# Patient Record
Sex: Female | Born: 1935 | Race: White | Hispanic: No | Smoking: Never smoker
Health system: Southern US, Community
[De-identification: ages and names within clinical notes are randomized; demographics above are authoritative.]

## PROBLEM LIST (undated history)

## (undated) DIAGNOSIS — E039 Hypothyroidism, unspecified: Secondary | ICD-10-CM

## (undated) DIAGNOSIS — J189 Pneumonia, unspecified organism: Secondary | ICD-10-CM

## (undated) DIAGNOSIS — I10 Essential (primary) hypertension: Secondary | ICD-10-CM

## (undated) DIAGNOSIS — J90 Pleural effusion, not elsewhere classified: Secondary | ICD-10-CM

## (undated) DIAGNOSIS — J449 Chronic obstructive pulmonary disease, unspecified: Secondary | ICD-10-CM

## (undated) DIAGNOSIS — F419 Anxiety disorder, unspecified: Secondary | ICD-10-CM

## (undated) DIAGNOSIS — M069 Rheumatoid arthritis, unspecified: Secondary | ICD-10-CM

## (undated) DIAGNOSIS — J9610 Chronic respiratory failure, unspecified whether with hypoxia or hypercapnia: Secondary | ICD-10-CM

## (undated) DIAGNOSIS — I509 Heart failure, unspecified: Secondary | ICD-10-CM

## (undated) HISTORY — DX: Pneumonia, unspecified organism: J18.9

## (undated) HISTORY — PX: HIP SURGERY: SHX245

## (undated) HISTORY — DX: Pleural effusion, not elsewhere classified: J90

## (undated) HISTORY — DX: Chronic respiratory failure, unspecified whether with hypoxia or hypercapnia: J96.10

---

## 2007-04-03 ENCOUNTER — Ambulatory Visit: Payer: Self-pay | Admitting: Pain Medicine

## 2007-04-11 ENCOUNTER — Ambulatory Visit: Payer: Self-pay | Admitting: Pain Medicine

## 2007-04-20 ENCOUNTER — Ambulatory Visit: Payer: Self-pay | Admitting: Pain Medicine

## 2007-05-04 ENCOUNTER — Ambulatory Visit: Payer: Self-pay | Admitting: *Deleted

## 2007-05-09 ENCOUNTER — Ambulatory Visit: Payer: Self-pay | Admitting: Unknown Physician Specialty

## 2007-05-11 ENCOUNTER — Ambulatory Visit: Payer: Self-pay | Admitting: Pain Medicine

## 2007-05-19 ENCOUNTER — Ambulatory Visit: Payer: Self-pay | Admitting: Unknown Physician Specialty

## 2007-05-30 ENCOUNTER — Ambulatory Visit: Payer: Self-pay | Admitting: Pain Medicine

## 2007-06-19 ENCOUNTER — Ambulatory Visit: Payer: Self-pay | Admitting: Physician Assistant

## 2007-07-20 ENCOUNTER — Ambulatory Visit: Payer: Self-pay | Admitting: Pain Medicine

## 2007-07-31 ENCOUNTER — Ambulatory Visit: Payer: Self-pay | Admitting: Pain Medicine

## 2007-09-06 ENCOUNTER — Ambulatory Visit: Payer: Self-pay | Admitting: Pain Medicine

## 2007-09-14 ENCOUNTER — Ambulatory Visit: Payer: Self-pay | Admitting: Pain Medicine

## 2007-09-28 ENCOUNTER — Ambulatory Visit: Payer: Self-pay | Admitting: Physician Assistant

## 2007-11-15 ENCOUNTER — Ambulatory Visit: Payer: Self-pay | Admitting: Physician Assistant

## 2007-12-14 ENCOUNTER — Ambulatory Visit: Payer: Self-pay | Admitting: Physician Assistant

## 2008-01-15 ENCOUNTER — Ambulatory Visit: Payer: Self-pay | Admitting: Physician Assistant

## 2008-02-15 ENCOUNTER — Ambulatory Visit: Payer: Self-pay | Admitting: Physician Assistant

## 2008-03-13 ENCOUNTER — Ambulatory Visit: Payer: Self-pay | Admitting: Family Medicine

## 2008-04-09 ENCOUNTER — Ambulatory Visit: Payer: Self-pay | Admitting: Physician Assistant

## 2008-06-19 ENCOUNTER — Ambulatory Visit: Payer: Self-pay | Admitting: Physician Assistant

## 2008-08-21 ENCOUNTER — Ambulatory Visit: Payer: Self-pay | Admitting: Physician Assistant

## 2008-11-19 ENCOUNTER — Ambulatory Visit: Payer: Self-pay | Admitting: Physician Assistant

## 2009-01-10 ENCOUNTER — Emergency Department: Payer: Self-pay | Admitting: Emergency Medicine

## 2009-02-12 ENCOUNTER — Ambulatory Visit: Payer: Self-pay | Admitting: Physician Assistant

## 2009-03-12 ENCOUNTER — Inpatient Hospital Stay: Payer: Self-pay | Admitting: Internal Medicine

## 2009-03-18 ENCOUNTER — Encounter: Payer: Self-pay | Admitting: Internal Medicine

## 2009-04-02 ENCOUNTER — Ambulatory Visit: Payer: Self-pay | Admitting: Internal Medicine

## 2009-04-05 ENCOUNTER — Encounter: Payer: Self-pay | Admitting: Internal Medicine

## 2009-05-01 ENCOUNTER — Ambulatory Visit: Payer: Self-pay | Admitting: Family Medicine

## 2009-07-14 ENCOUNTER — Ambulatory Visit: Payer: Self-pay | Admitting: Pain Medicine

## 2010-03-09 ENCOUNTER — Inpatient Hospital Stay: Payer: Self-pay | Admitting: Internal Medicine

## 2013-09-29 ENCOUNTER — Inpatient Hospital Stay: Payer: Self-pay | Admitting: Internal Medicine

## 2013-09-29 LAB — COMPREHENSIVE METABOLIC PANEL
ALBUMIN: 2.6 g/dL — AB (ref 3.4–5.0)
ALK PHOS: 391 U/L — AB
Anion Gap: 6 — ABNORMAL LOW (ref 7–16)
BUN: 17 mg/dL (ref 7–18)
Bilirubin,Total: 0.6 mg/dL (ref 0.2–1.0)
CHLORIDE: 103 mmol/L (ref 98–107)
CREATININE: 0.77 mg/dL (ref 0.60–1.30)
Calcium, Total: 9.1 mg/dL (ref 8.5–10.1)
Co2: 27 mmol/L (ref 21–32)
EGFR (African American): >60
EGFR (Non-African Amer.): >60
GLUCOSE: 122 mg/dL — AB (ref 65–99)
Osmolality: 275 (ref 275–301)
POTASSIUM: 4.7 mmol/L (ref 3.5–5.1)
SGOT(AST): 25 U/L (ref 15–37)
SGPT (ALT): 47 U/L (ref 12–78)
SODIUM: 136 mmol/L (ref 136–145)
TOTAL PROTEIN: 7.4 g/dL (ref 6.4–8.2)

## 2013-09-29 LAB — CBC
HCT: 36.4 % (ref 35.0–47.0)
HGB: 12 g/dL (ref 12.0–16.0)
MCH: 31.5 pg (ref 26.0–34.0)
MCHC: 32.9 g/dL (ref 32.0–36.0)
MCV: 96 fL (ref 80–100)
Platelet: 315 10*3/uL (ref 150–440)
RBC: 3.8 10*6/uL (ref 3.80–5.20)
RDW: 14.7 % — ABNORMAL HIGH (ref 11.5–14.5)
WBC: 11.1 10*3/uL — AB (ref 3.6–11.0)

## 2013-09-30 LAB — CBC WITH DIFFERENTIAL/PLATELET
BASOS ABS: 0.1 10*3/uL (ref 0.0–0.1)
Basophil %: 0.6 %
Eosinophil #: 0.1 10*3/uL (ref 0.0–0.7)
Eosinophil %: 1.1 %
HCT: 31.3 % — AB (ref 35.0–47.0)
HGB: 10.2 g/dL — ABNORMAL LOW (ref 12.0–16.0)
Lymphocyte #: 0.4 10*3/uL — ABNORMAL LOW (ref 1.0–3.6)
Lymphocyte %: 4.6 %
MCH: 30.9 pg (ref 26.0–34.0)
MCHC: 32.4 g/dL (ref 32.0–36.0)
MCV: 96 fL (ref 80–100)
MONOS PCT: 12 %
Monocyte #: 1.1 x10 3/mm — ABNORMAL HIGH (ref 0.2–0.9)
NEUTROS ABS: 7.7 10*3/uL — AB (ref 1.4–6.5)
NEUTROS PCT: 81.7 %
Platelet: 312 10*3/uL (ref 150–440)
RBC: 3.28 10*6/uL — ABNORMAL LOW (ref 3.80–5.20)
RDW: 14.3 % (ref 11.5–14.5)
WBC: 9.4 10*3/uL (ref 3.6–11.0)

## 2013-10-04 LAB — CULTURE, BLOOD (SINGLE)

## 2013-10-22 ENCOUNTER — Emergency Department: Payer: Self-pay | Admitting: Emergency Medicine

## 2014-01-28 ENCOUNTER — Inpatient Hospital Stay: Payer: Self-pay | Admitting: Internal Medicine

## 2014-01-28 LAB — CBC
HCT: 37.1 % (ref 35.0–47.0)
HGB: 11.8 g/dL — AB (ref 12.0–16.0)
MCH: 30.5 pg (ref 26.0–34.0)
MCHC: 31.7 g/dL — AB (ref 32.0–36.0)
MCV: 96 fL (ref 80–100)
Platelet: 277 10*3/uL (ref 150–440)
RBC: 3.86 10*6/uL (ref 3.80–5.20)
RDW: 15.4 % — ABNORMAL HIGH (ref 11.5–14.5)
WBC: 9.6 10*3/uL (ref 3.6–11.0)

## 2014-01-28 LAB — BASIC METABOLIC PANEL
ANION GAP: 4 — AB (ref 7–16)
BUN: 18 mg/dL (ref 7–18)
CALCIUM: 8.9 mg/dL (ref 8.5–10.1)
Chloride: 104 mmol/L (ref 98–107)
Co2: 29 mmol/L (ref 21–32)
Creatinine: 0.89 mg/dL (ref 0.60–1.30)
Glucose: 95 mg/dL (ref 65–99)
Potassium: 4.5 mmol/L (ref 3.5–5.1)
SODIUM: 137 mmol/L (ref 136–145)

## 2014-01-28 LAB — TROPONIN I: Troponin-I: <0.02 ng/mL

## 2014-01-29 LAB — CBC WITH DIFFERENTIAL/PLATELET
BASOS PCT: 0.1 %
Basophil #: 0 10*3/uL (ref 0.0–0.1)
Eosinophil #: 0 10*3/uL (ref 0.0–0.7)
Eosinophil %: 0 %
HCT: 33.7 % — AB (ref 35.0–47.0)
HGB: 10.7 g/dL — AB (ref 12.0–16.0)
LYMPHS PCT: 3.7 %
Lymphocyte #: 0.3 10*3/uL — ABNORMAL LOW (ref 1.0–3.6)
MCH: 30.9 pg (ref 26.0–34.0)
MCHC: 31.9 g/dL — ABNORMAL LOW (ref 32.0–36.0)
MCV: 97 fL (ref 80–100)
Monocyte #: 0.2 x10 3/mm (ref 0.2–0.9)
Monocyte %: 2.4 %
Neutrophil #: 6.5 10*3/uL (ref 1.4–6.5)
Neutrophil %: 93.8 %
Platelet: 243 10*3/uL (ref 150–440)
RBC: 3.48 10*6/uL — ABNORMAL LOW (ref 3.80–5.20)
RDW: 15.1 % — ABNORMAL HIGH (ref 11.5–14.5)
WBC: 6.9 10*3/uL (ref 3.6–11.0)

## 2014-01-29 LAB — BASIC METABOLIC PANEL
ANION GAP: 7 (ref 7–16)
BUN: 18 mg/dL (ref 7–18)
CHLORIDE: 105 mmol/L (ref 98–107)
CREATININE: 0.96 mg/dL (ref 0.60–1.30)
Calcium, Total: 8.3 mg/dL — ABNORMAL LOW (ref 8.5–10.1)
Co2: 28 mmol/L (ref 21–32)
EGFR (Non-African Amer.): 58 — ABNORMAL LOW
GLUCOSE: 180 mg/dL — AB (ref 65–99)
Osmolality: 286 (ref 275–301)
Potassium: 4.6 mmol/L (ref 3.5–5.1)
SODIUM: 140 mmol/L (ref 136–145)

## 2014-02-02 LAB — CULTURE, BLOOD (SINGLE)

## 2014-03-05 ENCOUNTER — Ambulatory Visit: Payer: Self-pay | Admitting: Internal Medicine

## 2014-03-10 ENCOUNTER — Inpatient Hospital Stay: Payer: Self-pay | Admitting: Specialist

## 2014-03-10 LAB — CK TOTAL AND CKMB (NOT AT ARMC)
CK, Total: 26 U/L (ref 26–192)
CK-MB: 0.9 ng/mL (ref 0.5–3.6)

## 2014-03-10 LAB — COMPREHENSIVE METABOLIC PANEL
ALK PHOS: 100 U/L
AST: 24 U/L (ref 15–37)
Albumin: 3.1 g/dL — ABNORMAL LOW (ref 3.4–5.0)
Anion Gap: 9 (ref 7–16)
BILIRUBIN TOTAL: 0.4 mg/dL (ref 0.2–1.0)
BUN: 29 mg/dL — AB (ref 7–18)
CALCIUM: 8.5 mg/dL (ref 8.5–10.1)
CO2: 27 mmol/L (ref 21–32)
Chloride: 103 mmol/L (ref 98–107)
Creatinine: 1.14 mg/dL (ref 0.60–1.30)
EGFR (African American): 57 — ABNORMAL LOW
GFR CALC NON AF AMER: 47 — AB
Glucose: 142 mg/dL — ABNORMAL HIGH (ref 65–99)
Osmolality: 286 (ref 275–301)
Potassium: 4.5 mmol/L (ref 3.5–5.1)
SGPT (ALT): 29 U/L
Sodium: 139 mmol/L (ref 136–145)
Total Protein: 7.1 g/dL (ref 6.4–8.2)

## 2014-03-10 LAB — CBC
HCT: 33.1 % — ABNORMAL LOW (ref 35.0–47.0)
HGB: 10.7 g/dL — ABNORMAL LOW (ref 12.0–16.0)
MCH: 31.8 pg (ref 26.0–34.0)
MCHC: 32.3 g/dL (ref 32.0–36.0)
MCV: 99 fL (ref 80–100)
Platelet: 219 10*3/uL (ref 150–440)
RBC: 3.36 10*6/uL — AB (ref 3.80–5.20)
RDW: 16 % — ABNORMAL HIGH (ref 11.5–14.5)
WBC: 7.9 10*3/uL (ref 3.6–11.0)

## 2014-03-10 LAB — PRO B NATRIURETIC PEPTIDE: B-Type Natriuretic Peptide: 2965 pg/mL — ABNORMAL HIGH (ref 0–450)

## 2014-03-10 LAB — TROPONIN I

## 2014-03-11 DIAGNOSIS — I340 Nonrheumatic mitral (valve) insufficiency: Secondary | ICD-10-CM

## 2014-03-11 LAB — CBC WITH DIFFERENTIAL/PLATELET
BASOS ABS: 0 10*3/uL (ref 0.0–0.1)
BASOS PCT: 0.1 %
EOS ABS: 0 10*3/uL (ref 0.0–0.7)
EOS PCT: 0.1 %
HCT: 31 % — ABNORMAL LOW (ref 35.0–47.0)
HGB: 10 g/dL — AB (ref 12.0–16.0)
Lymphocyte #: 0.1 10*3/uL — ABNORMAL LOW (ref 1.0–3.6)
Lymphocyte %: 2 %
MCH: 31.5 pg (ref 26.0–34.0)
MCHC: 32.2 g/dL (ref 32.0–36.0)
MCV: 98 fL (ref 80–100)
MONO ABS: 0.2 x10 3/mm (ref 0.2–0.9)
MONOS PCT: 3.2 %
NEUTROS ABS: 6.8 10*3/uL — AB (ref 1.4–6.5)
Neutrophil %: 94.6 %
Platelet: 213 10*3/uL (ref 150–440)
RBC: 3.16 10*6/uL — AB (ref 3.80–5.20)
RDW: 16 % — ABNORMAL HIGH (ref 11.5–14.5)
WBC: 7.2 10*3/uL (ref 3.6–11.0)

## 2014-03-11 LAB — BASIC METABOLIC PANEL
Anion Gap: 8 (ref 7–16)
BUN: 34 mg/dL — ABNORMAL HIGH (ref 7–18)
CO2: 27 mmol/L (ref 21–32)
Calcium, Total: 8.3 mg/dL — ABNORMAL LOW (ref 8.5–10.1)
Chloride: 104 mmol/L (ref 98–107)
Creatinine: 1.12 mg/dL (ref 0.60–1.30)
EGFR (African American): 59 — ABNORMAL LOW
GFR CALC NON AF AMER: 48 — AB
GLUCOSE: 173 mg/dL — AB (ref 65–99)
Osmolality: 289 (ref 275–301)
Potassium: 4.2 mmol/L (ref 3.5–5.1)
Sodium: 139 mmol/L (ref 136–145)

## 2014-03-11 LAB — MAGNESIUM: MAGNESIUM: 2.4 mg/dL

## 2014-03-12 LAB — BASIC METABOLIC PANEL
Anion Gap: 4 — ABNORMAL LOW (ref 7–16)
BUN: 39 mg/dL — AB (ref 7–18)
CALCIUM: 7.9 mg/dL — AB (ref 8.5–10.1)
CREATININE: 1.27 mg/dL (ref 0.60–1.30)
Chloride: 106 mmol/L (ref 98–107)
Co2: 30 mmol/L (ref 21–32)
EGFR (Non-African Amer.): 42 — ABNORMAL LOW
GFR CALC AF AMER: 51 — AB
Glucose: 111 mg/dL — ABNORMAL HIGH (ref 65–99)
OSMOLALITY: 289 (ref 275–301)
Potassium: 4.2 mmol/L (ref 3.5–5.1)
SODIUM: 140 mmol/L (ref 136–145)

## 2014-03-12 LAB — MAGNESIUM: Magnesium: 2.3 mg/dL

## 2014-03-13 LAB — BASIC METABOLIC PANEL
Anion Gap: 6 — ABNORMAL LOW (ref 7–16)
BUN: 29 mg/dL — AB (ref 7–18)
CALCIUM: 7.7 mg/dL — AB (ref 8.5–10.1)
CO2: 31 mmol/L (ref 21–32)
CREATININE: 1.04 mg/dL (ref 0.60–1.30)
Chloride: 105 mmol/L (ref 98–107)
EGFR (Non-African Amer.): 53 — ABNORMAL LOW
Glucose: 97 mg/dL (ref 65–99)
Osmolality: 289 (ref 275–301)
Potassium: 4.5 mmol/L (ref 3.5–5.1)
SODIUM: 142 mmol/L (ref 136–145)

## 2014-03-20 ENCOUNTER — Inpatient Hospital Stay: Payer: Self-pay | Admitting: Internal Medicine

## 2014-03-20 LAB — COMPREHENSIVE METABOLIC PANEL
ALBUMIN: 3.2 g/dL — AB (ref 3.4–5.0)
ALK PHOS: 77 U/L
Anion Gap: 6 — ABNORMAL LOW (ref 7–16)
BUN: 20 mg/dL — AB (ref 7–18)
Bilirubin,Total: 0.3 mg/dL (ref 0.2–1.0)
CHLORIDE: 101 mmol/L (ref 98–107)
Calcium, Total: 8.9 mg/dL (ref 8.5–10.1)
Co2: 32 mmol/L (ref 21–32)
Creatinine: 1.06 mg/dL (ref 0.60–1.30)
EGFR (Non-African Amer.): 52 — ABNORMAL LOW
Glucose: 132 mg/dL — ABNORMAL HIGH (ref 65–99)
Osmolality: 282 (ref 275–301)
POTASSIUM: 4.2 mmol/L (ref 3.5–5.1)
SGOT(AST): 20 U/L (ref 15–37)
SGPT (ALT): 18 U/L
Sodium: 139 mmol/L (ref 136–145)
Total Protein: 7.3 g/dL (ref 6.4–8.2)

## 2014-03-20 LAB — PRO B NATRIURETIC PEPTIDE: B-Type Natriuretic Peptide: 1808 pg/mL — ABNORMAL HIGH (ref 0–450)

## 2014-03-20 LAB — CBC
HCT: 35.8 % (ref 35.0–47.0)
HGB: 11.3 g/dL — ABNORMAL LOW (ref 12.0–16.0)
MCH: 31.1 pg (ref 26.0–34.0)
MCHC: 31.6 g/dL — ABNORMAL LOW (ref 32.0–36.0)
MCV: 98 fL (ref 80–100)
Platelet: 210 10*3/uL (ref 150–440)
RBC: 3.64 10*6/uL — ABNORMAL LOW (ref 3.80–5.20)
RDW: 15.6 % — ABNORMAL HIGH (ref 11.5–14.5)
WBC: 3.8 10*3/uL (ref 3.6–11.0)

## 2014-03-20 LAB — TROPONIN I: Troponin-I: <0.02 ng/mL

## 2014-03-21 LAB — CBC WITH DIFFERENTIAL/PLATELET
Basophil #: 0 10*3/uL (ref 0.0–0.1)
Basophil %: 0.4 %
Eosinophil #: 0.3 10*3/uL (ref 0.0–0.7)
Eosinophil %: 6.9 %
HCT: 31.2 % — AB (ref 35.0–47.0)
HGB: 9.9 g/dL — AB (ref 12.0–16.0)
LYMPHS ABS: 0.6 10*3/uL — AB (ref 1.0–3.6)
LYMPHS PCT: 15.3 %
MCH: 31.5 pg (ref 26.0–34.0)
MCHC: 31.8 g/dL — AB (ref 32.0–36.0)
MCV: 99 fL (ref 80–100)
Monocyte #: 0.6 x10 3/mm (ref 0.2–0.9)
Monocyte %: 15.3 %
NEUTROS ABS: 2.5 10*3/uL (ref 1.4–6.5)
Neutrophil %: 62.1 %
Platelet: 169 10*3/uL (ref 150–440)
RBC: 3.15 10*6/uL — ABNORMAL LOW (ref 3.80–5.20)
RDW: 15.1 % — ABNORMAL HIGH (ref 11.5–14.5)
WBC: 4 10*3/uL (ref 3.6–11.0)

## 2014-03-21 LAB — BASIC METABOLIC PANEL
Anion Gap: 4 — ABNORMAL LOW (ref 7–16)
BUN: 23 mg/dL — ABNORMAL HIGH (ref 7–18)
CHLORIDE: 102 mmol/L (ref 98–107)
CREATININE: 1.07 mg/dL (ref 0.60–1.30)
Calcium, Total: 8.5 mg/dL (ref 8.5–10.1)
Co2: 32 mmol/L (ref 21–32)
EGFR (African American): >60
EGFR (Non-African Amer.): 51 — ABNORMAL LOW
GLUCOSE: 93 mg/dL (ref 65–99)
Osmolality: 279 (ref 275–301)
Potassium: 4.2 mmol/L (ref 3.5–5.1)
SODIUM: 138 mmol/L (ref 136–145)

## 2014-03-22 ENCOUNTER — Other Ambulatory Visit: Payer: Self-pay | Admitting: Physician Assistant

## 2014-03-22 DIAGNOSIS — I10 Essential (primary) hypertension: Secondary | ICD-10-CM

## 2014-03-22 DIAGNOSIS — I5033 Acute on chronic diastolic (congestive) heart failure: Secondary | ICD-10-CM

## 2014-03-22 LAB — BODY FLUID CELL COUNT WITH DIFFERENTIAL
Basophil: 0 %
Eosinophil: 0 %
Lymphocytes: 57 %
Neutrophils: 4 %
Nucleated Cell Count: 2078 /mm3
OTHER MONONUCLEAR CELLS: 39 %
Other Cells BF: 0 %

## 2014-03-22 LAB — CBC WITH DIFFERENTIAL/PLATELET
Basophil #: 0 10*3/uL (ref 0.0–0.1)
Basophil %: 0.7 %
EOS ABS: 0.3 10*3/uL (ref 0.0–0.7)
Eosinophil %: 7.7 %
HCT: 31.1 % — ABNORMAL LOW (ref 35.0–47.0)
HGB: 9.9 g/dL — AB (ref 12.0–16.0)
Lymphocyte #: 0.5 10*3/uL — ABNORMAL LOW (ref 1.0–3.6)
Lymphocyte %: 15.6 %
MCH: 31.2 pg (ref 26.0–34.0)
MCHC: 31.7 g/dL — ABNORMAL LOW (ref 32.0–36.0)
MCV: 99 fL (ref 80–100)
MONO ABS: 0.5 x10 3/mm (ref 0.2–0.9)
MONOS PCT: 14.7 %
NEUTROS PCT: 61.3 %
Neutrophil #: 2.1 10*3/uL (ref 1.4–6.5)
Platelet: 164 10*3/uL (ref 150–440)
RBC: 3.16 10*6/uL — AB (ref 3.80–5.20)
RDW: 15.1 % — ABNORMAL HIGH (ref 11.5–14.5)
WBC: 3.4 10*3/uL — ABNORMAL LOW (ref 3.6–11.0)

## 2014-03-22 LAB — PROTEIN, BODY FLUID: PROTEIN, BODY FLUID: 2.3 g/dL

## 2014-03-22 LAB — AMYLASE, BODY FLUID: Amylase, Body Fluid: 20 U/L

## 2014-03-22 LAB — ALBUMIN, FLUID (OTHER): BODY FLUID ALBUMIN: 1.3 g/dL

## 2014-03-22 LAB — GLUCOSE, SEROUS FLUID: GLUCOSE, BODY FLUID: 147 mg/dL

## 2014-03-22 LAB — LACTATE DEHYDROGENASE, PLEURAL OR PERITONEAL FLUID: LDH, BODY FLUID: 68 U/L

## 2014-03-24 LAB — BASIC METABOLIC PANEL
Anion Gap: 2 — ABNORMAL LOW (ref 7–16)
BUN: 19 mg/dL — ABNORMAL HIGH (ref 7–18)
CHLORIDE: 101 mmol/L (ref 98–107)
CO2: 37 mmol/L — AB (ref 21–32)
Calcium, Total: 8.1 mg/dL — ABNORMAL LOW (ref 8.5–10.1)
Creatinine: 1 mg/dL (ref 0.60–1.30)
EGFR (African American): >60
GFR CALC NON AF AMER: 55 — AB
GLUCOSE: 96 mg/dL (ref 65–99)
Osmolality: 282 (ref 275–301)
Potassium: 4.3 mmol/L (ref 3.5–5.1)
Sodium: 140 mmol/L (ref 136–145)

## 2014-03-26 ENCOUNTER — Encounter: Payer: Self-pay | Admitting: Internal Medicine

## 2014-03-26 LAB — BODY FLUID CULTURE

## 2014-03-27 LAB — CBC WITH DIFFERENTIAL/PLATELET
BASOS ABS: 0 10*3/uL (ref 0.0–0.1)
Basophil %: 0.4 %
Eosinophil #: 0.3 10*3/uL (ref 0.0–0.7)
Eosinophil %: 6 %
HCT: 31.4 % — AB (ref 35.0–47.0)
HGB: 10.1 g/dL — AB (ref 12.0–16.0)
LYMPHS ABS: 0.6 10*3/uL — AB (ref 1.0–3.6)
LYMPHS PCT: 13 %
MCH: 31.6 pg (ref 26.0–34.0)
MCHC: 32.2 g/dL (ref 32.0–36.0)
MCV: 98 fL (ref 80–100)
MONO ABS: 0.6 x10 3/mm (ref 0.2–0.9)
MONOS PCT: 13 %
NEUTROS ABS: 3 10*3/uL (ref 1.4–6.5)
NEUTROS PCT: 67.6 %
Platelet: 153 10*3/uL (ref 150–440)
RBC: 3.2 10*6/uL — ABNORMAL LOW (ref 3.80–5.20)
RDW: 14.7 % — AB (ref 11.5–14.5)
WBC: 4.5 10*3/uL (ref 3.6–11.0)

## 2014-03-27 LAB — BASIC METABOLIC PANEL
Anion Gap: 1 — ABNORMAL LOW (ref 7–16)
BUN: 20 mg/dL — AB (ref 7–18)
CHLORIDE: 100 mmol/L (ref 98–107)
CO2: 36 mmol/L — AB (ref 21–32)
Calcium, Total: 8.3 mg/dL — ABNORMAL LOW (ref 8.5–10.1)
Creatinine: 1.06 mg/dL (ref 0.60–1.30)
EGFR (African American): >60
GFR CALC NON AF AMER: 52 — AB
GLUCOSE: 92 mg/dL (ref 65–99)
OSMOLALITY: 276 (ref 275–301)
Potassium: 4.1 mmol/L (ref 3.5–5.1)
Sodium: 137 mmol/L (ref 136–145)

## 2014-03-28 LAB — BASIC METABOLIC PANEL
Anion Gap: 4 — ABNORMAL LOW (ref 7–16)
BUN: 22 mg/dL — AB (ref 7–18)
CALCIUM: 8.4 mg/dL — AB (ref 8.5–10.1)
CHLORIDE: 97 mmol/L — AB (ref 98–107)
Co2: 37 mmol/L — ABNORMAL HIGH (ref 21–32)
Creatinine: 1.14 mg/dL (ref 0.60–1.30)
EGFR (African American): 57 — ABNORMAL LOW
EGFR (Non-African Amer.): 47 — ABNORMAL LOW
GLUCOSE: 103 mg/dL — AB (ref 65–99)
Osmolality: 279 (ref 275–301)
POTASSIUM: 3.7 mmol/L (ref 3.5–5.1)
Sodium: 138 mmol/L (ref 136–145)

## 2014-04-05 ENCOUNTER — Encounter: Payer: Self-pay | Admitting: Internal Medicine

## 2014-04-05 ENCOUNTER — Ambulatory Visit: Payer: Self-pay | Admitting: Internal Medicine

## 2014-07-27 NOTE — Discharge Summary (Signed)
Dates of Admission and Diagnosis:  Date of Admission 20-Mar-2014   Date of Discharge 28-Mar-2014   Admitting Diagnosis Pneumonia   Final Diagnosis 1. Bilateral pneumonia 2. Bilateral pleural effusions s/p right thoracentesis 3. Acute on chronic resp failure 4. Acute on chronic diastolic chf    Chief Complaint/History of Present Illness PRIMARY CARE PHYSICIAN:  Dr. Tania Ade.    REFERRING PHYSICIAN:  Gwynneth Albright, MD   CHIEF COMPLAINT:  Shortness of breath for 3 days.   HISTORY OF PRESENT ILLNESS: A 81 year old Caucasian female with a history of hypertension, CHF, rheumatoid arthritis, was sent from home due to shortness of breath for 3 days. The patient is alert, awake, oriented, in no acute distress. The patient was just discharged from our hospital to home about 1 week ago after treatment of acute CHF. The patient was fine until 3 days ago. The patient started to have shortness of breath, cough with clear sputum. The patient feels not good but she denies any fever, even she had some chills. The patient denies any other symptoms. The patient had chest x-ray showed infiltrate. She was treated with Zithromax, Rocephin and admitted for pneumonia.   Allergies:  Levaquin: Rash  Sulfa drugs: Unknown  Pertinent Past History:  Pertinent Past History PAST MEDICAL HISTORY: Acute CHF, hypertension, rheumatoid arthritis, hypothyroidism, anxiety, history of pneumonia.   Hospital Course:  Hospital Course 81 f with chronic obstructive pulmonary disease, ILD, RA here with SOB  * Bilateral Pneumonia - CXR with persistent patchy infiltrate Conitnue abx. d/c Vancomycin. Afebrile. Normal WBC. Bilateral pleural effusions s/p Thoracenetesis on right with  650 ml fluid on 12/18. Transudate. Appreciate pulmonary help,not recommending further thoracentesis.  Cefuroxime at discharge   * Acute on chronic diastolic congestive heart failure - resolved Continue lasix PO as OP  * Hypertension: monitor  on norvasc, lasix, losartan. adjust as need  * Rheumatoid Arthritis - cont. Plaquenil.  * hypothyroidism: continue synthroid  D/C to SNF   Condition on Discharge Fair   Code Status:  Code Status No Code/Do Not Resuscitate   PHYSICAL EXAM ON DISCHARGE:  Physical Exam:  GEN no acute distress, thin   NECK supple  No masses   RESP normal resp effort   CARD regular rate   ABD denies tenderness   EXTR negative edema   PSYCH alert   Additional Comments Decreased hearing   DISCHARGE INSTRUCTIONS HOME MEDS:  Medication Reconciliation: Patient's Home Medications at Discharge:     Medication Instructions  synthroid 50 mcg (0.05 mg) oral tablet  1 tab(s) orally once a day   plaquenil 200 mg oral tablet  1 tab(s) orally once a day   paxil 10 mg oral tablet  1 tab(s) orally once a day   losartan 25 mg oral tablet  1 tab(s) orally once a day   furosemide 20 mg oral tablet  1 tab(s) orally once a day   senna  1 tab(s) orally 2 times a day, As needed, constipation   dextromethorphan-guaifenesin 10 mg-100 mg/5 ml oral liquid  10 milliliter(s) orally every 8 hours, As needed, cough   cefuroxime 500 mg oral tablet  1 tab(s) orally 2 times a day   ensure *  240 milliliter(s) orally    albuterol-ipratropium 2.5 mg-0.5 mg/3 ml inhalation solution  3 milliliter(s) inhaled every 6 hours, As Needed - for Shortness of Breath     Physician's Instructions:  Home Oxygen? Yes   Oxygen delivery at home: 2L  Nasal Cannula   Diet  Low Sodium   Activity Limitations As tolerated   Return to Work Not Applicable   Time frame for Follow Up Appointment 1-2 days  Physician at rehab   Electronic Signatures: Azaria Stegman, Andreas Blower (MD)  (Signed 23-Dec-15 16:02)  Authored: ADMISSION DATE AND DIAGNOSIS, CHIEF COMPLAINT/HPI, Allergies, PERTINENT PAST HISTORY, HOSPITAL COURSE, PHYSICAL EXAM ON DISCHARGE, DISCHARGE INSTRUCTIONS HOME MEDS, PATIENT INSTRUCTIONS   Last Updated: 23-Dec-15 16:02 by  Minette Headland (MD)

## 2014-07-27 NOTE — Discharge Summary (Signed)
PATIENT NAME:  Karen Bryan, WOHLFARTH MR#:  [id number] DATE OF BIRTH:  1919-05-17  DATE OF ADMISSION:  01/28/2014 DATE OF DISCHARGE:  01/30/2014   PRIMARY CARE PHYSICIAN:  [facility].    CONSULTATIONS IN THE HOSPITAL: None.   DISCHARGE DIAGNOSES: 1.  Acute respiratory failure.  2.  Pneumonia.  3.  Interstitial lung disease, which is chronic.  4.  Hypertension.  5.  Rheumatoid arthritis.  6.  Osteoporosis.  7.  Hypothyroidism.  8.  Hard of hearing.  9.  Depression.  10.  Left lung nodule.  DISCHARGE HOME MEDICATION:   1.  Synthroid 50 mcg p.o. daily.  2.  Plaquenil 200 mg p.o. daily.  3.  Paxil 10 mg p.o. daily.  4.  Losartan 50 mg p.o. daily.  5.  Amlodipine 10 mg p.o. daily.  6.  Doxycycline 100 mg p.o. b.i.d. for 7 more days.  7.  Advair 250/50 one puff b.i.d.  8.  Albuterol inhaler q.6 hours p.r.n. for wheezing.  9.  Prednisone taper over 6 days.   Discharge home health physical therapy and nursing home oxygen: None.   DISCHARGE ACTIVITY: As tolerated.   DISCHARGE DIET: Low-sodium diet.    FOLLOWUP INSTRUCTIONS:  PCP follow-up in 1 week.  Lung nodule followup with PCP.   Laboratory data and imaging studies prior to discharge: WBC 6.9, hemoglobin 10.7, hematocrit 33.7, platelet count 243,000.   Sodium 140, potassium 4.6, chloride 105, bicarbonate 28. BUN 18, creatinine 0.96.   Glucose 180. Calcium of 8.3. Blood cultures are negative   Chest x-ray showing chronic interstitial lung disease changes, which are extensive.  New infiltrate in the right lung base and there is left lung 7 mm nodule.  brief hospital course:  Karen Bryan is a 81 year old elderly Caucasian female who lives at home with personal care service coming in and helping her out everyday. Ambulates with a Nicol, at baseline now. Presents to the hospital secondary to difficulty breathing. Chest x-ray shows diffuse interstitial lung disease likely related to her rheumatoid arthritis, but also a possible pneumonia  in the right lung base.   1.  Acute respiratory failure.  As patient was in slight respiratory distress with some tachypnea and also sats were going down to 88% on room air.  The patient has never been on any home oxygen. She was started with oxygen support, IV steroids for wheezing and also antibiotics  Rocephin and azithromycin. Blood cultures are negative. Her oxygen requirements have improved and she is saturating well on room air and is being discharged home. She probably has underlying lung disease and in the future might need home oxygen, but at this point she is doing fine. She has a small left lung nodule noted. Not sure if related to rheumatoid arthritis. Further workup as an outpatient recommended. She is being discharged on doxycycline, prednisone taper and inhalers.  2.  Hypertension.  Home medications were adjusted because her blood pressure was very high in the hospital. She is being discharged on losartan, amlodipine. 3.  Hypothyroidism on Synthroid.  4.  Plaquenil.  She follows with Dr. Lavenia Atlas.    5.  Rheumatoid arthritis.  Continue her Plaquenil and she follows with Dr. Gavin Potters as an outpatient.  6.    Her course has been otherwise uneventful in the hospital.   DISCHARGE CONDITION: Stable.   DISCHARGE DISPOSITION: Home with home health.   Time spent on discharge: 40 minutes.  CODE STATUS: Full code.     ____________________________ Karen Baas,  MD rk:jw D: 01/30/2014 13:11:10 ET T: 01/30/2014 21:21:22 ET JOB#: [id number]  cc: Karen Baas, MD, <Dictator> Karen Baas MD ELECTRONICALLY SIGNED 02/05/2014 10:55

## 2014-07-27 NOTE — Discharge Summary (Signed)
PATIENT NAME:  Karen Bryan, Karen Bryan MR#:  [id number] DATE OF BIRTH:  Nov 09, 1919  PRIMARY CARE PHYSICIAN: Aram Beecham B. Weeks, MD  DISCHARGE DIAGNOSES: 1.  Acute diastolic congestive heart failure.  2.  Dehydration due to Lasix.  3.  Hypertension.  4.  Rheumatoid arthritis.  CONDITION: Stable.   CODE STATUS: Full code.   HOME MEDICATIONS: Please refer to the medication reconciliation list.   DIET: Low-sodium diet.   ACTIVITY: As tolerated.   FOLLOWUP CARE:  PCP within 1-2 weeks. The patient needs home health, needs home oxygen 2 liters by nasal cannula.   REASON FOR ADMISSION: Shortness of breath.   HOSPITAL COURSE: The patient is a 81 year old Caucasian female with a history of hypertension, rheumatoid arthritis, who presented to the ED with shortness of breath and nonproductive cough. For this, chest x-ray showed mild CHF. For detailed history and physical examination, please refer to the admission note dictated by Dr. Cherlynn Kaiser.   LABORATORY DATA: Unremarkable, except BUN 29. Hemoglobin 10.7.   After admission, the patient has been treated with Lasix 20 mg IV b.i.d. with CHF protocol. The patient's symptoms have much improved. She is off oxygen by nasal cannula; however, the patient's O2 saturation decreased to the 80s on exertion without oxygen, so the patient needs home oxygen 2 liters by nasal cannula. For acute CHF, the patient's echocardiograph showed ejection fraction 60%-65%. Since patient's BUN increased to 39, I decreased Lasix to 20 mg daily.  BUN decreased to 29 today.  The patient will keep Lasix 20 mg p.o. daily and watch BMP as outpatient.   Hypertension has been controlled.   The patient has no complaints. She is clinically stable and will be discharged to home with home health today. I discussed patient's discharge plan with the patient, the patient's son, nurse, case Production designer, theatre/television/film.   TIME SPENT: About 41 minutes.    ____________________________ Shaune Pollack,  MD qc:LT D: 03/13/2014 12:59:25 ET T: 03/13/2014 17:18:34 ET JOB#: [id number]  cc: Shaune Pollack, MD, <Dictator> Shaune Pollack MD ELECTRONICALLY SIGNED 03/14/2014 14:43

## 2014-07-27 NOTE — H&P (Signed)
PATIENT NAME:  Karen Bryan, Karen Bryan MR#:  [id number] DATE OF BIRTH:  [birth year]  DATE OF ADMISSION:  09/29/2013  PRIMARY CARE PHYSICIAN: [facility].  PRIMARY HEMATOLOGIST:  Dr. Saverio Danker.  PRIMARY PODIATRY DOCTOR: Dr. Alberteen Spindle.   REFERRING EMERGENCY ROOM PHYSICIAN: Dr. Mindi Junker.   CHIEF COMPLAINT: Fall.  HISTORY OF PRESENT ILLNESS: This is a 81 year old female with history of hypertension, chronic arthritis and lower back pain, who lives at home alone, but has daily 7-8 hours visiting aid and her son and sister-in- law also live nearby taking care of other requirements. At night, she is usually alone at home. For the last 3-4 days, she is having cough which is getting worse, so the aid, who is present in the room told me that she took her to [facility] on Thursday, which is 2 days ago. She was examined by the doctor over there. No x-ray was done, but she was told that there is no pneumonia.  She was taking lisinopril and hydrochlorothiazide for her hypertension for 3-4 years and was taken off from lisinopril and told that that might cause this cough.  She was taken off 2 day ago from that.  Last night, when she tried to get up from the bed and go to the bathroom, she feel down, and so she was brought to the Emergency Room by family today morning. On work-up in the ER, the CAT scans were done to rule out any injuries, which are negative, but it showed that she has pneumonia on her left side of the chest and so she was given at admission to hospitalist team for further management. The patient is hard of hearing and so unable to get much history from the patient, but this information obtained from her sister-in-law and her aid, who is present in her room.  REVIEW OF SYSTEMS:  CONSTITUTIONAL:  Negative for fever, fatigue, weakness, pain or weight loss.  EYES: No blurring, double vision, discharge or redness.  EARS, NOSE, THROAT: No tinnitus, ear pain or hearing loss.  RESPIRATORY: The patient has  cough, but no wheezing or shortness of breath. No sputum production.  CARDIOVASCULAR: No chest pain, orthopnea, edema, arrhythmia or palpitations.  GASTROINTESTINAL: No nausea, vomiting, diarrhea, abdominal pain.  GENITOURINARY: No dysuria, hematuria, or increased frequency.  ENDOCRINE: No heat or cold intolerance. No excessive sweating.  SKIN: No acne, rashes, or lesions.  MUSCULOSKELETAL: No pain or swelling in the joints.  NEUROLOGICAL: No numbness, weakness, tremor or vertigo.  PSYCHIATRIC: No anxiety, insomnia, bipolar disorder.   PAST MEDICAL HISTORY:  1.  Chronic disabling rheumatoid arthritis.  2.  Hypertension.  3.  Osteoarthritis.  4.  Osteoporosis.  5.  Chronic lower back pain, getting epidural injections in pain clinic every 3 months.  6.  Hypothyroidism.  7.  Depression.   SURGICAL HISTORY: 1.  Vertebroplasty.  2.  Left hip surgery.  3.  Cataract excision.   FAMILY HISTORY: Her son has diabetes, but negative for coronary artery disease and hypertension.   SOCIAL HISTORY: Negative tobacco, alcohol, or illicit drug use. She lives at home by herself and walks with a [employer]. Her appetite is good and memory is also preserved, but has some hearing  problem and uses hearing aid. Has aid daily for 7-8 hours and family lives nearby to take care of her.    HOME MEDICATIONS: 1.  Zithromax 500 mg oral once a day.  2.  Synthroid 50 mcg oral once a day.  3.  Paroxetine 10 mg  oral once a day.  4.  Losartan 25 mg once a day.  5.  Keflex 500 mg oral capsule 3 times a day.  6.  Hydroxychloroquine once a day, 200 mg.  7.  Amlodipine 2.5 mg once a day.   PHYSICAL EXAMINATION:  VITAL SIGNS: In ER, temperature 98.4, pulse is 88, respiration is 20, blood pressure 168/60, and pulse oximetry is 85 on room air on presentation, but currently 92 on room air.  GENERAL: The patient is fully alert and oriented to time, place, and person. Has some hearing problem, but cooperative with history  taking and physical examination.  Appears thin, but not in any distress.  HEENT: Head and neck atraumatic. Conjunctiva pink. Oral mucosa moist.  NECK: Supple. No JVD.  RESPIRATORY: Bilateral equal air entry. Crepitation present.  CARDIOVASCULAR: S1, S2 present, regular murmur present. No tenderness on local palpation of chest.  ABDOMEN: Soft, nontender. Bowel sounds present. No organomegaly.  SKIN: No rashes, but there is breakdown of skin, and stitches on left leg, which are stitches that were done by ER today, and dressing present over it.  No edema on the legs. Joints: Chronic deformity because of arthritis. No tenderness.  NEUROLOGICAL: Power 4/5. Generalized weakness. No tremors. Follows commands. Moves all four limbs.  PSYCHIATRIC: Does not appear in any acute psychiatric illness at this time.   IMPORTANT LABORATORY RESULTS: Glucose 122, BUN 17, creatinine 0.77, sodium is 136, potassium 4.7, chloride 103, CO2 is 27. Calcium is 9.1, total protein 7.4, albumin 2.6, bilirubin 0.6, alkaline phosphate 91, SGOT 25, and SGPT 47. WBC 11.1, hemoglobin 12.0, hematocrit 36.4, platelet count is 315,000, and MCV is 96.   CT scan of the head without contrast and cervical spine without contrast are done because of fall, and no acute traumatic injury of cervical spine.  Severe multifocal degenerative disk disease.  Patchy multifocal infiltrate.  Partially visualized lung in right upper lobe and layering left pleural effusion, partially visualized on the upper part of the lung. CT maxillofacial is done, no acute fracture.  CT head did not show any acute intracranial process, global cerebral atrophy with advanced chronic microvascular ischemia, bilateral mastoid effusion.   IMAGING RESULTS: Chest x-ray, PA and lateral, shows new symmetric interstitial and airspace opacity, primarily in left lower lobe suggesting pneumonia versus asymmetric edema, small pleural effusions present.   ASSESSMENT AND PLAN: A  81 year old female with history of hypertension, hypothyroidism, and chronic arthritis, who lives alone, but independent and very active in day-to-day life had cough for 3-4 days and fell down during the night, so she was brought to Emergency Room and found to have pneumonia.  1.  Community-acquired pneumonia.  She started by ER on doxepin and azithromycin.  We will continue the same.  Get sputum culture and continue monitoring.   2.  Fall.  There is no acute fracture, some skin laceration on left leg, for which stitches are done  by ER physician. We will continue wound dressing and monitoring that, and we will get physical therapy evaluation to re-decide the discharge needs   3.  Hypothyroidism. Continue levothyroxine.   4.  Hypertension. Continue home medication losartan and amlodipine.  5.  Chronic arthritis is taking hydroxychloroquine. We will continue the same.   6.  Depression. She is taking paroxetine, will continue the same.   7.  CODE STATUS is DO NOT RESUSCITATE. I confirmed with the patient in presence of her aid and her sister-in-law and her healthcare power of attorney's son  Feliciana Rossetti.  TOTAL TIME SPENT ON THIS ADMISSION: 50 minutes.    ____________________________ Hope Pigeon Elisabeth Pigeon, MD vgv:[id number] D: 09/29/2013 11:22:01 ET T: 09/29/2013 13:38:32 ET JOB#: [id number]  cc: Hope Pigeon. Elisabeth Pigeon, MD, <Dictator> Altamese Dilling MD ELECTRONICALLY SIGNED 10/01/2013 11:07

## 2014-07-27 NOTE — Consult Note (Signed)
General Aspect Primary Cardiologist: New to [facility] _______________  81 year old female with history of chronic diastolic CHF, COPD with chronic respiratory failure on 2L O2 at home via [place], venous insufficiency, recurrent PNA, PUD, RA, HTN, hypothyroid, hearing loss, & osteoporosis who was recently admitted to [facility] 12/6-12/9 for acute on chronic diastolic CHF and underwent successful diuresis presented to [facility] on 12/16 with 3 day history of increased SOB and cough productive of clear sputum. She was found to have a RUL PNA. Cardiology was consulted for further evaluation of her CHF.  _______________  PMH: 1. Chronic diastolic CHF 2. COPD with chronic respiratory failure on 2L O2 at home via [place] 3. Venous insufficiency 4. Recurrent PNA 5. PUD 6. RA 7. HTN 8. Hypothyroidism 9. Hearing loss 10. Osteoporosis ________________   Present Illness 81 year old female with the above complex problem list who was recently admitted as above presented to [facility] with the above complaint.   Patient is with known diastolic CHF. During her last admission from 12/6-12/9 for acute on chronic diastolic CHF she underwent successful diuresis with some dehydration 2/2 Lasix. Echo during that admission showed an EF of 60-65%, mild MR/TR, mildly elevated PASP of 38.6 mm Hg. She denies any prior stress tests or cardiac caths. She has had multiple recent PNAs, mostly occuring in the lower lobes raising question for aspiration. At baseline she sleeps with one pillow, however over the past 3 days she has been sleeping with 2 pillows to aid in her breathing. No early satiety.   She presented to [facility] on 12/16 with a 3 day history of increased SOB and cough that was productive of clear sputum. Afebrile. No chills. She denies any chest pain, palpitations, nausea, vomiting, presyncope, or syncope. Upon her arrival to [facility] she was found to have a right upper lobe PNA and was started on empiric Zithromax and Rocephin. She was seen by  pulmonology who suggested barium swallow 2/2 possible aspiration PNA and to continue incentive spiro. Her ILD was felt to be most likely 2/2 her RA. CT of the chest showed Bilateral moderate pleural effusions. Patchy infiltrative, likely infectious changes throughout both lungs with a more consolidative component in the lower lobes bilaterally. Followup examination following appropriate therapy is recommended. Her diastolic CHF was felt to be well compensated. She continued to be SOB. She is -760 for the admission.   Physical Exam:  GEN no acute distress   HEENT PERRL, HOH   NECK supple  no JVD   RESP diffuse rhonchi   CARD Regular rate and rhythm  Normal, S1, S2  Murmur   Murmur Systolic  2/6   ABD denies tenderness  soft   EXTR negative edema   SKIN normal to palpation   NEURO cranial nerves intact   PSYCH alert   Review of Systems:  General: Fatigue  Weakness   Skin: No Complaints   ENT: No Complaints   Eyes: No Complaints   Neck: No Complaints   Respiratory: Frequent cough  Short of breath  Sputum   Cardiovascular: Dyspnea   Gastrointestinal: No Complaints   Genitourinary: No Complaints   Vascular: No Complaints   Musculoskeletal: No Complaints   Neurologic: No Complaints   Hematologic: No Complaints   Endocrine: No Complaints   Psychiatric: No Complaints   Review of Systems: All other systems were reviewed and found to be negative   Medications/Allergies Reviewed Medications/Allergies reviewed   Family & Social History:  Family and Social History:  Family History Coronary Artery Disease   Social History negative tobacco, negative ETOH, negative Illicit drugs   Place of Living Home     Hypothyroidism:    htn:    Rheumatoid Arthritis: 1950   Cataract Extraction: Bilateral, 2004   Hip Surgery - Left: Replacement, 1992  Home Medications: Medication Instructions Status  furosemide 20 mg oral tablet 1 tab(s) orally once a day Active   Paxil 10 mg oral tablet 1 tab(s) orally once a day Active  Plaquenil 200 mg oral tablet 1 tab(s) orally once a day Active  Synthroid 50 mcg (0.05 mg) oral tablet 1 tab(s) orally once a day Active  losartan 25 mg oral tablet 1 tab(s) orally once a day Active   Lab Results:  Routine Chem:  17-Dec-15 05:22   Glucose, Serum 93  BUN  23  Creatinine (comp) 1.07  Sodium, Serum 138  Potassium, Serum 4.2  Chloride, Serum 102  CO2, Serum 32  Calcium (Total), Serum 8.5  Anion Gap  4  Osmolality (calc) 279  eGFR (African American) >60  eGFR (Non-African American)  51 (eGFR values <52m/min/1.73 m2 may be an indication of chronic kidney disease (CKD). Calculated eGFR, using the MRDR Study equation, is useful in  patients with stable renal function. The eGFR calculation will not be reliable in acutely ill patients when serum creatinine is changing rapidly. It is not useful in patients on dialysis. The eGFR calculation may not be applicable to patients at the low and high extremes of body sizes, pregnant women, and vegetarians.)  Cardiac:  16-Dec-15 16:10   Troponin I < 0.02 (0.00-0.05 0.05 ng/mL or less: NEGATIVE  Repeat testing in 3-6 hrs  if clinically indicated. >0.05 ng/mL: POTENTIAL  MYOCARDIAL INJURY. Repeat  testing in 3-6 hrs if  clinically indicated. NOTE: An increase or decrease  of 30% or more on serial  testing suggests a  clinically important change)  Routine Hem:  17-Dec-15 05:22   WBC (CBC) 4.0  RBC (CBC)  3.15  Hemoglobin (CBC)  9.9  Hematocrit (CBC)  31.2  Platelet Count (CBC) 169  MCV 99  MCH 31.5  MCHC  31.8  RDW  15.1  Neutrophil % 62.1  Lymphocyte % 15.3  Monocyte % 15.3  Eosinophil % 6.9  Basophil % 0.4  Neutrophil # 2.5  Lymphocyte #  0.6  Monocyte # 0.6  Eosinophil # 0.3  Basophil # 0.0 (Result(s) reported on 21 Mar 2014 at 06:18AM.)   EKG:  EKG Interp. by me   Interpretation NSR, 67 bpm, no st/t changes   Radiology Results: XRay:     16-Dec-15 16:52, Chest PA and Lateral  Chest PA and Lateral   REASON FOR EXAM:    Shortness of Breath  COMMENTS:   May transport without cardiac monitor    PROCEDURE: DXR - DXR CHEST PA (OR AP) AND LATERAL  - Mar 20 2014  4:52PM     CLINICAL DATA:  Cough for 1 week shortness of breath, chest  congestion    EXAM:  CHEST  2 VIEW    COMPARISON:  03/10/2014    FINDINGS:  Cardiomediastinal silhouette is stable. Again noted bilateral  interstitial prominence probable chronic without definite  superimposed pulmonary edema. Small left pleural effusion with left  basilar atelectasis or infiltrate. There is patchy  infiltrate/pneumonitis in right upper lobe. Follow-up to resolution  is recommended. Osteopenia and degenerative changes thoracic spine.  Prior vertebroplasty lumbar spine.     IMPRESSION:  Probable chronic interstitial  prominence without convincing  pulmonary edema. Patchy infiltrate/ pneumonitis in right upper lobe.  Small left pleural effusion with left basilar atelectasis or  infiltrate.      Electronically Signed    By: Lahoma Crocker M.D.    On: 03/20/2014 17:04         Verified By: Ephraim Hamburger, M.D.,  CT:    17-Dec-15 16:33, CT Chest Without Contrast  CT Chest Without Contrast   REASON FOR EXAM:    sob  COMMENTS:       PROCEDURE: CT  - CT CHEST WITHOUT CONTRAST  - Mar 21 2014  4:33PM     CLINICAL DATA:  Shortness of breath with exertion    EXAM:  CT CHEST WITHOUT CONTRAST    TECHNIQUE:  Multidetector CT imaging of the chest was performed following the  standard protocol without IV contrast..    COMPARISON:  03/20/2014  FINDINGS:  The lungs are well aerated bilaterally. Patchy infiltrates are noted  throughout both lobes with more consolidative appearance in the  lower lobes bilaterally. Bilateral moderate pleural effusions are.  No focal parenchymal nodules are seen. Diffuse bronchial  calcifications are seen. Aortic calcifications are noted  without  aneurysmal dilatation. No significant hilar or mediastinal  adenopathy is seen. Heavy coronary calcifications are noted.  Visualized upper abdomen is within normal limits. Multilevel  degenerative changes are noted in the thoracic spine. Changes of  prior vertebral augmentation are noted in the lower thoracic spine.     IMPRESSION:  Bilateral moderate pleural effusions.  Patchy infiltrative, likely infectious changes throughout both lungs  with a more consolidative component in the lower lobes bilaterally.  Followup examination following appropriate therapy is recommended.      Electronically Signed    By: Inez Catalina M.D.    On: 03/21/2014 17:02         Verified By: Everlene Farrier, M.D.,    Levaquin: Rash  Sulfa drugs: Unknown  Vital Signs/Nurse's Notes: **Vital Signs.:   18-Dec-15 05:23  Vital Signs Type Routine  Temperature Temperature (F) 98.1  Celsius 36.7  Pulse Pulse 60  Respirations Respirations 20  Systolic BP Systolic BP 013  Diastolic BP (mmHg) Diastolic BP (mmHg) 78  Mean BP 102  Pulse Ox % Pulse Ox % 96  Pulse Ox Activity Level  At rest  Oxygen Delivery 2L    Impression 81 year old female with history of chronic diastolic CHF, COPD with chronic respiratory failure on 2L O2 at home via [place], venous insufficiency, recurrent PNA, PUD, RA, HTN, hypothyroid, hearing loss, & osteoporosis who was recently admitted to [facility] 12/6-12/9 for acute on chronic diastolic CHF and underwent successful diuresis presented to [facility] on 12/16 with 3 day history of increased SOB and cough productive of clear sputum. She was found to have a RUL PNA. Cardiology was consulted for further evaluation of her diastolic CHF.   1. Acute on chronic diastolic CHF: -Recent echo during prior admission (03/11/2014) with EF 60-65%, mild MR/TR, mildly elevated PASP (38.6 mmHg) -Continue with optimal medical therapy -Increase Lasix to 40 mg daily given CT findings of moderate bilateral  pleural effusions - monitor SCr (currently 1.07) -Continue losartan 25 mg daily -Not on b-blocker at this time given chronic respiratory failure -IM considering possible thoracentesis (therapeutic)  2. ILD/PNA: -On ABX -Per IM  3. HTN: -Lasix increased as above -Continue losartan  4. Hypothyroidism  5. RA   Electronic Signatures for Addendum Section:  Kathlyn Sacramento (MD) (Signed  Addendum 18-Dec-15 19:06)  The patient was seen and examined. AGree with the above. She presented with dyspnea with B pleural effusions. REcent echo showed normal EF with diastolic dysfunction and mild pulmonary hypertension.  AGree with gentle diuresis.   Electronic Signatures: Kathlyn Sacramento (MD)  (Signed 18-Dec-15 19:06)  Co-Signer: General Aspect/Present Illness, History and Physical Exam, Review of System, Family & Social History, Past Medical History, Home Medications, Labs, EKG , Radiology, Allergies, Vital Signs/Nurse's Notes, Impression/Plan Daphnee Preiss M (PA-C)  (Signed 18-Dec-15 12:28)  Authored: General Aspect/Present Illness, History and Physical Exam, Review of System, Family & Social History, Past Medical History, Home Medications, Labs, EKG , Radiology, Allergies, Vital Signs/Nurse's Notes, Impression/Plan   Last Updated: 18-Dec-15 19:06 by Kathlyn Sacramento (MD)

## 2014-07-27 NOTE — Discharge Summary (Signed)
PATIENT NAME:  Karen Bryan, Karen Bryan MR#:  [id number] DATE OF BIRTH:  1920-01-27  DATE OF ADMISSION:  09/29/2013 DATE OF DISCHARGE:  10/02/2013  PRIMARY CARE PHYSICIAN: [facility], Dr. Drema Halon.  FINAL DIAGNOSES:  1. Pneumonia.  2. Fall with laceration of the leg and bruising of the left face.  3. Hypertension.  4. Hypothyroidism.   MEDICATIONS ON DISCHARGE: Include Paxil 10 mg daily, hydroxychloroquine 200 mg daily, losartan 25 mg daily, Synthroid 50 mcg daily, amlodipine 2.5 mg daily, Zithromax 250 mg 1 tablet daily for 3 days, cefuroxime 500 mg 1 tablet every 12 hours for 7 more days.   Home health, physical therapy and nurse.  Stitches need to be kept clean and dry. Keep the bandage in place unless becomes loose or soiled. Need to remove sutures in 1 week with your medical doctor.   DIET: Low-sodium diet, regular consistency.   ACTIVITY: As tolerated.   FOLLOWUP: 1-2 weeks with Dr. Cleaster Corin.    HOSPITAL COURSE: The patient admitted 09/29/2013, discharged 10/02/2013. Came in with a fall, was found to have pneumonia, was started on Rocephin and Zithromax. For her fall, she did have a laceration which required sutures in the Emergency Room.  LABORATORY AND RADIOLOGICAL DATA: EKG showed a normal sinus rhythm, nonspecific ST-T wave changes. CT scan of the maxillofacial area showed no acute intracranial process, global cerebral atrophy, chronic advanced microvascular ischemic disease, bilateral mastoid effusions. Large left infraorbital contusion, globes intact. No retro-orbital hematoma or other pathology. No orbital floor fracture.   CT scan of the cervical spine showed no acute traumatic injury, multilevel degenerative disk disease throughout the cervical spine. Patchy multifocal infiltrates, partially visualized upper lobe suspicious for pneumonia. Chest x-ray showed interstitial and airspace opacities in the left lower lobe suggestive of pneumonia.   White blood cell  count 11.1, H and H 12.0 and 36.4, platelet count of 315,000, glucose 122, BUN 17, creatinine 0.77, sodium 136, potassium 4.7, chloride 103, CO2 of 27, calcium 9.1, alkaline phosphatase 391, ALT 47, AST 25. Blood cultures negative. White count upon discharge 9.4. Patient afebrile on discharge 98.3, temperature.  HOSPITAL COURSE PER PROBLEM LIST:  1. For the patient's pneumonia with leukocytosis, this was treated with Rocephin and Zithromax during the hospital course, switched over to Ceftin and Zithromax orally secondary to IV infiltration. The patient's vital signs were stable on 10/01/2013. Family was out of town though, unsafe to discharge on the 29th secondary to nobody else at home, and the patient living alone. Family did set up aides to stay with them until they come back into town. The patient was discharged home with home health on 10/02/2013.  2. Fall with laceration of the leg and bruising of the left face. Will need sutures removed next week.  3. Hypertension. Blood pressure borderline on amlodipine and losartan. Continue current medications.  4. Hypothyroidism. On Synthroid. Continue levothyroxine.   TIME SPENT ON DISCHARGE: 35 minutes.    ____________________________ Herschell Dimes. Renae Gloss, MD rjw:lt D: 10/02/2013 14:37:00 ET T: 10/02/2013 23:44:30 ET JOB#: [id number]  cc: Herschell Dimes. Renae Gloss, MD, <Dictator> [facility] Jeanella Anton MD ELECTRONICALLY SIGNED 10/04/2013 16:20

## 2014-07-27 NOTE — H&P (Signed)
PATIENT NAME:  Karen Bryan, CONSTABLE MR#:  [id number] DATE OF BIRTH:  22-Oct-1919  PRIMARY CARE PHYSICIAN: [facility].   CHIEF COMPLAINT: Shortness of breath.   HISTORY OF PRESENT ILLNESS: This is a 81 year old female who presents to the hospital complaining of shortness of breath that began last night into this morning. The patient was recently admitted to the hospital about a month ago at [facility] for pneumonia, now comes in complaining of shortness of breath again that began acutely this morning. The patient admits to a cough, but it is nonproductive. She denies any paroxysmal nocturnal dyspnea, no orthopnea, any lower extremity edema or any weight gain. She denies any fevers, chills, chest pain, nausea, vomiting, or any other associated symptoms. The patient presented to the hospital and the patient's chest x-ray finding was just of mild CHF. Hospitalist services were contacted for further treatment and evaluation.   REVIEW OF SYSTEMS:  CONSTITUTIONAL: No documented fever. No weight gain, no weight loss.  EYES: No blurry or double vision.  EARS, NOSE, THROAT: No tinnitus. No postnasal drip. No redness of the oropharynx.  RESPIRATORY: Positive cough. Positive wheeze. Positive shortness of breath.  CARDIOVASCULAR: No chest pain. No orthopnea, no palpitations, no syncope.  GASTROINTESTINAL: No nausea, no vomiting. No diarrhea. No abdominal pain. No melena, no hematochezia.  GENITOURINARY: No dysuria, no hematuria. ENDOCRINE:   No polyuria or nocturia. No heat or cold intolerance.  HEMATOLOGIC: No anemia, no bruising, no bleeding.  INTEGUMENTARY: No rashes, no lesions.  MUSCULOSKELETAL: No arthritis, no swelling, no gout.  NEUROLOGIC: No numbness, no tingling, no ataxia. No seizure activity.  PSYCHIATRIC: Positive anxiety, no insomnia. No ADD.   PAST MEDICAL HISTORY: Consistent with rheumatoid arthritis, hypothyroidism, hypertension, anxiety, history of recent pneumonia.   ALLERGIES: LEVAQUIN AND SULFA  DRUGS.   SOCIAL HISTORY: No smoking. No alcohol abuse. No illicit drug abuse. Lives by herself.   FAMILY HISTORY: The patient's mother died from complications of CHF. Father died of unknown causes.   CURRENT MEDICATIONS: As follows: Advair 250/50 one puff b.i.d., albuterol inhaler 2 puffs q. 6 hours as needed, Norvasc 10 mg daily, losartan 25 mg daily, Paxil 10 mg daily, Plaquenil 200 mg daily, Synthroid 50 mcg daily.   PHYSICAL EXAMINATION: Presently is as follows:  VITAL SIGNS: Noted to be temperature of 98.2, pulse 99, respirations 22, blood pressure 146/58, saturation is 88% on room air.  GENERAL: She is a pleasant-appearing female in no apparent distress.  HEAD, EYES, EARS, NOSE AND THROAT: Atraumatic, normocephalic. Extraocular muscles are intact. Pupils equal and reactive to light. Sclerae anicteric. No conjunctival injection. No pharyngeal erythema.  NECK: Supple. There is no jugular venous distention. No bruits, no lymphadenopathy, no thyromegaly.  HEART: Regular rate and rhythm. She does have a 2/6 systolic ejection murmur at the left sternal border. No rubs, no clicks.  LUNGS: She has some dried coarse crackles diffusely. Negative use of accessory muscles. No dullness to percussion.  ABDOMEN: Soft, flat, nontender, nondistended. Has good bowel sounds. No hepatosplenomegaly appreciated.  EXTREMITIES: No evidence of cyanosis or clubbing. Does have +1-2 pitting edema from knees to the ankles bilaterally, +2 pedal and radial pulses bilaterally.  NEUROLOGICAL: The patient is alert, awake, and oriented x 3 with no focal motor or sensory deficits appreciated bilaterally.  SKIN: Moist and warm with no rashes appreciated.  LYMPHATIC: There is no cervical or axillary lymphadenopathy.   LABORATORY DATA: Showed a serum glucose of 142, BUN 29, creatinine 1.14, sodium 139, potassium 4.5, chloride 103,  bicarbonate 27. The patient's LFTs are within normal limits. White cell count 7.9, hemoglobin  10.7, hematocrit 33.1, platelet count 219,000.   IMAGING:  The patient did have a chest x-ray done which showed interstitial pulmonary edema with more focal right upper lobe airspace opacity which could represent asymmetric alveolar edema, although alveolar filling process could appear similar.  ASSESSMENT AND PLAN: This is a 81 year old female with history of rheumatoid arthritis, hypothyroidism, hypertension, anxiety, history of recent pneumonia, who presents to the hospital due to shortness of breath.   Problem #1.  Shortness of breath. This is likely multifactorial in nature. The patient likely has some interstitial lung disease related to her rheumatoid arthritis complicated with some mild congestive heart failure. I will diurese the patient with intravenous Lasix, follow ins and outs and daily weights, check a 2-dimensional echocardiogram. The patient likely needs to be on home oxygen prior to discharge, which needs to be assessed.  Problem #2.  Congestive heart failure. This is likely acute decompensated congestive heart failure. I presume it is probably diastolic, but we will check a 2-dimensional echocardiogram, diurese with intravenous Lasix and follow ins and outs.  Problem #3.  Hypothyroidism. Continue Synthroid.  Problem #4.  Hypertension, hemodynamically stable. Continue Norvasc and continue losartan.  Problem #5.  History of rheumatoid arthritis. Continue Plaquenil.  The patient follows with Dr. Gavin Potters.  Problem #6.  Anxiety. Continue with Paxil.   CODE STATUS: The patient is a full code.   TIME SPENT: 50 minutes.    ____________________________ Rolly Pancake. Cherlynn Kaiser, MD vjs:LT D: 03/10/2014 15:17:09 ET T: 03/10/2014 16:14:57 ET JOB#: [id number]  cc: Rolly Pancake. Cherlynn Kaiser, MD, <Dictator> Houston Siren MD ELECTRONICALLY SIGNED 03/22/2014 10:43

## 2014-07-27 NOTE — H&P (Signed)
PATIENT NAME:  Karen Bryan, Karen Bryan MR#:  [id number] DATE OF BIRTH:  [birth year]  ADMITTING PHYSICIAN: Enid Baas, MD  PRIMARY CARE PHYSICIAN: At [facility].   CHIEF COMPLAINT: Difficulty breathing, and coughing.   HISTORY OF PRESENT ILLNESS: Karen Bryan is a very pleasant, 81 year old, elderly Caucasian female with past medical history significant for hypertension, rheumatoid arthritis, osteoarthritis, osteoporosis, and hypothyroidism.  Presents to the hospital from home secondary to worsening breathing, cough, and congestion for 4 days now. The patient is very hard of hearing and her home care nurse, who was assisting her in the ER, has just left.  According to the patient, she started feeling bad about 4-5 days ago, started with more like congestion, upper respiratory tract infection symptoms and the cough started to get worse, so that she has been having trouble breathing and she was noted to have audible wheezing today and was brought to the ER. Her saturations were 95% on room air at rest; however, as soon as she started exerting herself, her saturations dropped down to 88%. She also was a little bit tachypneic when she came in  and breathing much better at this time. The patient denies any fevers or chills. No chest pain. No abdominal pain, nausea, or vomiting. She does not have a white count; however, her chest x-ray does show possible right lower lobe infiltrate with hypoxia, so she is being admitted for pneumonia.   PAST MEDICAL HISTORY: 1.  Hypertension.  2.  Rheumatoid arthritis.  3.  Osteoporosis.  4.  Osteoarthritis.   PAST SURGICAL HISTORY:  1.  Hip replacement surgery. 2.  Bilateral cataract surgery.   ALLERGIES TO MEDICATIONS:  LEVAQUIN AND SULFA DRUGS.   CURRENT HOME MEDICATIONS:  1.  Losartan 25 mg p.o. daily.  2.  Amlodipine 2.5 mg p.o. daily.  3.  Paxil 10 mg p.o. daily.  3.  Plaquenil 200 mg p.o. daily.  4.  Synthroid 50 mcg p.o. daily.   SOCIAL HISTORY: Lives at  home by herself, has a caregiver, who comes in the morning and leaves in the evening. Her son also lives close by. Ambulates with the help of a Hlavac, but does need help with most daily activities.   FAMILY HISTORY: Noncontributory.   REVIEW OF SYSTEMS:  CONSTITUTIONAL: No fever, fatigue, or weakness.  EYES: Positive for blurred vision and cataract surgery. No inflammation or glaucoma.  EARS, NOSE, THROAT: Positive for hearing loss. No tinnitus, ear pain, epistaxis, or discharge.  RESPIRATORY: Positive for cough, wheeze, no hemoptysis. Positive for chronic interstitial lung disease.  CARDIOVASCULAR: No chest pain, orthopnea, edema, arrhythmia, palpitations, or syncope.  GASTROINTESTINAL: No nausea, vomiting, diarrhea, abdominal pain, hematemesis, or melena.  GENITOURINARY: No dysuria, hematuria, renal calculus, frequency, or incontinence.  ENDOCRINE: No polyuria, nocturia, thyroid problems, heat or cold intolerance. HEMATOLOGY: No anemia, easy bruising, or bleeding.  SKIN: No acne, rash, or lesions.  MUSCULOSKELETAL: Positive for joint pains. NEUROLOGIC:  No CVA, TIA, or seizures.  PSYCHOLOGICAL: No anxiety, insomnia, or depression.   PHYSICAL EXAMINATION: VITAL SIGNS: Temperature 98.5 degrees Fahrenheit, pulse 80, respirations 21, blood pressure 155/75, pulse oximetry 95% on 2 liters.  GENERAL: A well-built, well-nourished female lying in bed, not in any acute distress.  HEENT: Normocephalic, atraumatic. Pupils equal, round, reacting to light. Anicteric sclerae. Extraocular movements intact. Oropharynx is clear without erythema, mass, or exudates. NECK: Supple. No thyromegaly, JVD, or carotid bruits. No lymphadenopathy.  LUNGS: Coarse rhonchi bilaterally, worse on the posterior side, occasional scattered wheezing. No use of  accessory muscles for breathing at rest.  CARDIOVASCULAR: S1, S2, regular rate and rhythm. A 3/6 systolic murmur present. No rubs or gallops.  ABDOMEN: Soft,  nontender, nondistended. No hepatosplenomegaly. Normal bowel sounds.  EXTREMITIES: 1+ pedal edema, has compression stockings on. No clubbing or cyanosis. Feeble dorsalis pedis pulses palpable bilaterally.  SKIN: No acne, rash, or lesions.  LYMPHATICS:  No cervical or inguinal lymphadenopathy. NEUROLOGIC: Cranial nerves intact. No focal motor or sensory deficits.  PSYCHOLOGIC: The patient is awake, alert, oriented x 3.   LABORATORY DATA: WBC 9.6, hemoglobin 11.8, hematocrit 37.1, platelet count 277,000,  sodium 137, potassium 4.6, chloride 104, bicarbonate 29, BUN 18, creatinine 0.89, glucose 97,  calcium of 8.9. Troponin is negative. Chest x-ray showing emphysematous bronchitis changes and include atelectasis versus new infiltrate at lung base and chronic interstitial changes at left base.  There is possible 7 mm new nodular density in the left lung.   ASSESSMENT AND PLAN: A 81 year old female with hypertension, rheumatoid arthritis, and osteoarthritis.  Comes from home secondary to worsening cough and dyspnea and noted to be hypoxic and chest x-ray showing pneumonia.  1.  Acute respiratory distress secondary to new right lower lobe infiltrate. The patient does have chronic underlying lung disease. The patient is not on any home oxygen, now requiring 1-2 liters.  Will start IV steroids, nebulizer treatments, blood cultures and  start on Rocephin and azithromycin.  2.  New left lung nodule.  Can be worked up with a CT of the chest, either inpatient or outpatient.  3.  Hypertension, on Norvasc and losartan.  4.  Rheumatoid arthritis.  Continue Plaquenil.  5.  For deep venous thrombosis prophylaxis, subcutaneous heparin.   CODE STATUS OF THE PATIENT: Full code.   TIME SPENT ON ADMISSION : 50 minutes.    ____________________________ Enid Baas, MD rk:LT D: 01/28/2014 17:57:34 ET T: 01/28/2014 18:35:04 ET JOB#: [id number]  cc: Enid Baas, MD, <Dictator> [facility] Enid Baas MD ELECTRONICALLY SIGNED 02/05/2014 10:54

## 2014-07-27 NOTE — H&P (Signed)
PATIENT NAME:  Karen Bryan, Karen Bryan MR#:  [id number] DATE OF BIRTH:  1919-05-31  DATE OF ADMISSION:  03/20/2014  PRIMARY CARE PHYSICIAN:  Dr. Tania Ade.    REFERRING PHYSICIAN:  Gwynneth Albright, MD   CHIEF COMPLAINT:  Shortness of breath for 3 days.   HISTORY OF PRESENT ILLNESS: A 81 year old Caucasian female with a history of hypertension, CHF, rheumatoid arthritis, was sent from home due to shortness of breath for 3 days. The patient is alert, awake, oriented, in no acute distress. The patient was just discharged from our hospital to home about 1 week ago after treatment of acute CHF. The patient was fine until 3 days ago. The patient started to have shortness of breath, cough with clear sputum. The patient feels not good but she denies any fever, even she had some chills. The patient denies any other symptoms. The patient had chest x-ray showed infiltrate. She was treated with Zithromax, Rocephin and admitted for pneumonia.   PAST MEDICAL HISTORY: Acute CHF, hypertension, rheumatoid arthritis, hypothyroidism, anxiety, history of pneumonia.   PAST SURGICAL HISTORY:  Cataract extraction, left hip surgery.   SOCIAL HISTORY: The patient is living alone. No smoking or drinking or illicit drugs.   FAMILY HISTORY: The patient's mother died from complication of CHF; father died of unknown causes.   ALLERGIES: LEVAQUIN, SULFA DRUGS.   HOME MEDICATIONS:  1.  Synthroid 50 mcg p.o. daily.  2.  Plaquenil 200 mg p.o. daily.  3.  Paxil 10 mg p.o. daily.  4.  Losartan 25 mg p.o. daily.  5.  Lasix 20 mg p.o. daily.  6.  Norvasc 10 mg p.o. daily.  7.  Albuterol CFC free 90 mcg 2 puffs every 6 hours p.r.n. 8.  Advair 250 mcg/50 mcg inhalation 1 puff inhaled twice a day.   REVIEW OF SYSTEMS:  CONSTITUTIONAL: The patient denies any fever, but has chills. No headache or dizziness but has generalized weakness.  EYES: No double vision or blurred vision.  EARS AND NOSE AND THROAT: No postnasal drip, slurred  speech or dysphagia, but is hard of hearing.  CARDIOVASCULAR: No chest pain, palpitation, orthopnea, or nocturnal dyspnea, but has leg edema.  PULMONARY: Positive for cough, sputum, shortness of breath, but no hemoptysis.  GASTROINTESTINAL: No abdominal pain, nausea, vomiting, diarrhea. No melena or bloody stool.  GENITOURINARY: No dysuria, hematuria or incontinence.  SKIN: No rash or jaundice.  NEUROLOGIC: No syncope, loss of consciousness, or seizure.  ENDOCRINE: No polyuria, polydipsia, heat or cold intolerance.  HEMATOLOGIC: No easy bruising or bleeding.   VITAL SIGNS: Temperature 98, blood pressure 136/49, pulse 66, O2 saturation 97% on oxygen 2 liters.   PHYSICAL EXAMINATION:  GENERAL: The patient is alert, awake, oriented, in no acute distress.  HEENT: Pupils round, equal and reactive to light and accommodation. Moist oral mucosa. Clear oropharynx.  NECK: Supple. No JVD or carotid bruits. No lymphadenopathy. No thyromegaly.  CARDIOVASCULAR: S1 and S2. Regular rate and rhythm. No murmurs, gallop.  PULMONARY: Bilateral air entry. Bilateral crackles. No wheezing. No use of accessory muscle to breathe.  ABDOMEN: Soft. No distention or tenderness. No organomegaly. Bowel sounds present.  EXTREMITIES: Bilateral lower extremity edema 1+, no clubbing or cyanosis. No calf tenderness. Bilateral pedal pulses are present.  SKIN: No rash or jaundice.  NEUROLOGIC: A and O x 3. No focal deficit. Power 5/5. Sensation intact.   DIAGNOSTIC DATA:   1.  Chest x-ray showed chronic interstitial prominence without evidence of pulmonary edema; patchy infiltrate, pneumonitis in  the right upper lobe.  2.  Small left pleural effusion with left basilar atelectasis or infiltrate.  3.  WBC 3.8, hemoglobin 11.3, platelets 210,000, glucose 132, BUN 20, creatinine 1.06, electrolytes normal, troponin less than 0.02; BNP 1808, which is lower than before.  4.  EKG showed normal sinus rhythm at 67 BPM.    IMPRESSIONS: 1.  Bilateral pneumonia.  2.  History of diastolic congestive heart failure.  3.  Hypertension.  4.  Rheumatoid arthritis.  5.  Anemia, stable.   PLAN OF TREATMENT: 1.  The patient will be admitted to medical floor. The patient was treated with Zithromax and Rocephin, we will continue and follow up blood culture, sputum culture. Give nebulizer treatment p.r.n.  2.  For CHF we will continue Lasix.  3.  For hypertension, continue Norvasc, Lasix, losartan.   I discussed the patient's condition and plan of treatment with the patient and the patient's granddaughter.   CODE STATUS: The patient wants DNR.   TIME SPENT:  About 56 minutes.    ____________________________ Shaune Pollack, MD qc:nt D: 03/20/2014 19:19:16 ET T: 03/20/2014 19:51:41 ET JOB#: [id number]  cc: Shaune Pollack, MD, <Dictator> Shaune Pollack MD ELECTRONICALLY SIGNED 03/21/2014 14:38

## 2014-08-10 ENCOUNTER — Emergency Department: Payer: Medicare Other

## 2014-08-10 ENCOUNTER — Encounter: Payer: Self-pay | Admitting: Emergency Medicine

## 2014-08-10 ENCOUNTER — Inpatient Hospital Stay
Admission: EM | Admit: 2014-08-10 | Discharge: 2014-08-14 | DRG: 292 | Disposition: A | Discharge status: Home / Self Care | Payer: Medicare Other | Attending: Internal Medicine | Admitting: Internal Medicine

## 2014-08-10 DIAGNOSIS — I5023 Acute on chronic systolic (congestive) heart failure: Secondary | ICD-10-CM

## 2014-08-10 DIAGNOSIS — Z8249 Family history of ischemic heart disease and other diseases of the circulatory system: Secondary | ICD-10-CM

## 2014-08-10 DIAGNOSIS — Z9981 Dependence on supplemental oxygen: Secondary | ICD-10-CM

## 2014-08-10 DIAGNOSIS — I872 Venous insufficiency (chronic) (peripheral): Secondary | ICD-10-CM | POA: Diagnosis present

## 2014-08-10 DIAGNOSIS — M069 Rheumatoid arthritis, unspecified: Secondary | ICD-10-CM | POA: Diagnosis present

## 2014-08-10 DIAGNOSIS — M810 Age-related osteoporosis without current pathological fracture: Secondary | ICD-10-CM | POA: Diagnosis present

## 2014-08-10 DIAGNOSIS — I160 Hypertensive urgency: Secondary | ICD-10-CM | POA: Diagnosis present

## 2014-08-10 DIAGNOSIS — I10 Essential (primary) hypertension: Secondary | ICD-10-CM | POA: Diagnosis present

## 2014-08-10 DIAGNOSIS — I5033 Acute on chronic diastolic (congestive) heart failure: Secondary | ICD-10-CM | POA: Diagnosis not present

## 2014-08-10 DIAGNOSIS — Z8701 Personal history of pneumonia (recurrent): Secondary | ICD-10-CM

## 2014-08-10 DIAGNOSIS — E039 Hypothyroidism, unspecified: Secondary | ICD-10-CM | POA: Diagnosis present

## 2014-08-10 DIAGNOSIS — J9610 Chronic respiratory failure, unspecified whether with hypoxia or hypercapnia: Secondary | ICD-10-CM | POA: Diagnosis present

## 2014-08-10 DIAGNOSIS — J449 Chronic obstructive pulmonary disease, unspecified: Secondary | ICD-10-CM | POA: Diagnosis present

## 2014-08-10 DIAGNOSIS — Z8711 Personal history of peptic ulcer disease: Secondary | ICD-10-CM

## 2014-08-10 DIAGNOSIS — R0602 Shortness of breath: Secondary | ICD-10-CM

## 2014-08-10 DIAGNOSIS — I38 Endocarditis, valve unspecified: Secondary | ICD-10-CM

## 2014-08-10 DIAGNOSIS — Z66 Do not resuscitate: Secondary | ICD-10-CM | POA: Diagnosis present

## 2014-08-10 DIAGNOSIS — H9190 Unspecified hearing loss, unspecified ear: Secondary | ICD-10-CM | POA: Diagnosis present

## 2014-08-10 HISTORY — DX: Essential (primary) hypertension: I10

## 2014-08-10 HISTORY — DX: Heart failure, unspecified: I50.9

## 2014-08-10 HISTORY — DX: Chronic obstructive pulmonary disease, unspecified: J44.9

## 2014-08-10 LAB — CBC
HCT: 37.1 % (ref 35.0–47.0)
HEMOGLOBIN: 12.1 g/dL (ref 12.0–16.0)
MCH: 29.9 pg (ref 26.0–34.0)
MCHC: 32.6 g/dL (ref 32.0–36.0)
MCV: 92 fL (ref 80.0–100.0)
Platelets: 198 10*3/uL (ref 150–440)
RBC: 4.03 MIL/uL (ref 3.80–5.20)
RDW: 15.6 % — ABNORMAL HIGH (ref 11.5–14.5)
WBC: 10.4 10*3/uL (ref 3.6–11.0)

## 2014-08-10 LAB — COMPREHENSIVE METABOLIC PANEL
ALBUMIN: 3.8 g/dL (ref 3.5–5.0)
ALK PHOS: 83 U/L (ref 38–126)
ALT: 11 U/L — ABNORMAL LOW (ref 14–54)
AST: 21 U/L (ref 15–41)
Anion gap: 11 (ref 5–15)
BUN: 29 mg/dL — AB (ref 6–20)
CALCIUM: 9.2 mg/dL (ref 8.9–10.3)
CHLORIDE: 99 mmol/L — AB (ref 101–111)
CO2: 29 mmol/L (ref 22–32)
Creatinine, Ser: 0.96 mg/dL (ref 0.44–1.00)
GFR calc Af Amer: 58 mL/min — ABNORMAL LOW (ref >60)
GFR calc non Af Amer: 50 mL/min — ABNORMAL LOW (ref >60)
Glucose, Bld: 129 mg/dL — ABNORMAL HIGH (ref 65–99)
Potassium: 4.8 mmol/L (ref 3.5–5.1)
Sodium: 139 mmol/L (ref 135–145)
Total Bilirubin: 0.3 mg/dL (ref 0.3–1.2)
Total Protein: 7.6 g/dL (ref 6.5–8.1)

## 2014-08-10 LAB — CK TOTAL AND CKMB (NOT AT ARMC)
CK, MB: 1.8 ng/mL (ref 0.5–5.0)
Relative Index: 1.6 (ref 0.0–2.5)
Total CK: 114 U/L (ref 38–234)

## 2014-08-10 LAB — BRAIN NATRIURETIC PEPTIDE: B NATRIURETIC PEPTIDE 5: 251 pg/mL — AB (ref 0.0–100.0)

## 2014-08-10 LAB — TROPONIN I: Troponin I: 0.03 ng/mL (ref <0.031)

## 2014-08-10 MED ORDER — FUROSEMIDE 10 MG/ML IJ SOLN
40.0000 mg | Freq: Two times a day (BID) | INTRAMUSCULAR | Status: DC
  Administered 2014-08-11: 40 mg via INTRAVENOUS
  Filled 2014-08-10: qty 4

## 2014-08-10 MED ORDER — HEPARIN SODIUM (PORCINE) 5000 UNIT/ML IJ SOLN
5000.0000 [IU] | Freq: Three times a day (TID) | INTRAMUSCULAR | Status: DC
  Administered 2014-08-10 – 2014-08-14 (×11): 5000 [IU] via SUBCUTANEOUS
  Filled 2014-08-10 (×11): qty 1

## 2014-08-10 MED ORDER — ACETAMINOPHEN 325 MG PO TABS: 650.0000 mg | ORAL_TABLET | Freq: Four times a day (QID) | ORAL | Status: DC | PRN

## 2014-08-10 MED ORDER — FUROSEMIDE 10 MG/ML IJ SOLN: 20.0000 mg | Freq: Every day | INTRAMUSCULAR | Status: DC

## 2014-08-10 MED ORDER — HYDRALAZINE HCL 20 MG/ML IJ SOLN: 10.0000 mg | INTRAMUSCULAR | Status: DC | PRN

## 2014-08-10 MED ORDER — LOSARTAN POTASSIUM 50 MG PO TABS
50.0000 mg | ORAL_TABLET | Freq: Every day | ORAL | Status: DC
  Administered 2014-08-11 – 2014-08-14 (×4): 50 mg via ORAL
  Filled 2014-08-10 (×4): qty 1

## 2014-08-10 MED ORDER — HYPROMELLOSE (GONIOSCOPIC) 2.5 % OP SOLN
1.0000 [drp] | Freq: Every day | OPHTHALMIC | Status: DC | PRN
  Filled 2014-08-10: qty 15

## 2014-08-10 MED ORDER — PAROXETINE HCL 10 MG PO TABS
10.0000 mg | ORAL_TABLET | Freq: Every day | ORAL | Status: DC
  Administered 2014-08-11 – 2014-08-14 (×4): 10 mg via ORAL
  Filled 2014-08-10 (×4): qty 1

## 2014-08-10 MED ORDER — MORPHINE SULFATE 2 MG/ML IJ SOLN: 2.0000 mg | INTRAMUSCULAR | Status: DC | PRN

## 2014-08-10 MED ORDER — LEVOTHYROXINE SODIUM 50 MCG PO TABS
50.0000 ug | ORAL_TABLET | Freq: Every day | ORAL | Status: DC
  Administered 2014-08-11 – 2014-08-14 (×4): 50 ug via ORAL
  Filled 2014-08-10 (×4): qty 1

## 2014-08-10 MED ORDER — MAGNESIUM SULFATE GRAN
1.0000 "application " | GRANULES | Freq: Every day | Status: DC
  Administered 2014-08-12: 1 via TOPICAL
  Filled 2014-08-10: qty 454

## 2014-08-10 MED ORDER — SODIUM CHLORIDE 0.9 % IJ SOLN
3.0000 mL | Freq: Two times a day (BID) | INTRAMUSCULAR | Status: DC
  Administered 2014-08-10 – 2014-08-14 (×8): 3 mL via INTRAVENOUS

## 2014-08-10 MED ORDER — HYDROXYCHLOROQUINE SULFATE 200 MG PO TABS
200.0000 mg | ORAL_TABLET | Freq: Every day | ORAL | Status: DC
  Administered 2014-08-11 – 2014-08-14 (×4): 200 mg via ORAL
  Filled 2014-08-10 (×4): qty 1

## 2014-08-10 MED ORDER — ACETAMINOPHEN 650 MG RE SUPP: 650.0000 mg | Freq: Four times a day (QID) | RECTAL | Status: DC | PRN

## 2014-08-10 MED ORDER — IPRATROPIUM-ALBUTEROL 0.5-2.5 (3) MG/3ML IN SOLN
3.0000 mL | RESPIRATORY_TRACT | Status: DC
  Administered 2014-08-11 (×6): 3 mL via RESPIRATORY_TRACT
  Filled 2014-08-10 (×6): qty 3

## 2014-08-10 NOTE — H&P (Signed)
[facility] [place] [facility] [place]   PATIENT NAME: Karen Bryan    MR#:  [id number]  DATE OF BIRTH:  10/12/1919   DATE OF ADMISSION:  08/10/2014  PRIMARY CARE PHYSICIAN: Leanna Sato, MD   REQUESTING/REFERRING PHYSICIAN: Manson Passey  CHIEF COMPLAINT:   Chief Complaint  Patient presents with  . Shortness of Breath    HISTORY OF PRESENT ILLNESS:  Karen Bryan  is a 81 y.o. female with a known history of diastolic congestive heart failure, COPD 2 L has Baseline presenting with shortness of breath. She is a somewhat poor historian however describes one-week total duration of shortness of breath mainly describes dyspnea on exertion. She attests to having associated lower extremity edema for the same duration denies any frank chest pain, orthopnea. Does mention having a cough which is nonproductive denies fevers or chills mentioned department workup reveals pulmonary edema on chest x-ray she was also noted to be markedly hypertensive upon arrival  PAST MEDICAL HISTORY:   Past Medical History  Diagnosis Date  . COPD (chronic obstructive pulmonary disease)   . CHF (congestive heart failure)   . Hypertension     PAST SURGICAL HISTORY:  History reviewed. No pertinent past surgical history.  SOCIAL HISTORY:   History  Substance Use Topics  . Smoking status: Never Smoker   . Smokeless tobacco: Not on file  . Alcohol Use: No    FAMILY HISTORY:  History reviewed. No pertinent family history.  DRUG ALLERGIES:  No Known Allergies  REVIEW OF SYSTEMS:  REVIEW OF SYSTEMS:  CONSTITUTIONAL: Denies fevers, chills,  positive for fatigue, weakness.  EYES: Denies blurred vision, double vision, or eye pain.  EARS, NOSE, THROAT: Denies tinnitus, ear pain, hearing loss.  RESPIRATORY:Positive for cough, shortness of breath,  denies wheezing  CARDIOVASCULAR: Denies chest pain, palpitations,  positive for edema.  GASTROINTESTINAL: Denies nausea, vomiting, diarrhea,  abdominal pain.  GENITOURINARY: Denies dysuria, hematuria.  ENDOCRINE: Denies nocturia or thyroid problems. HEMATOLOGIC AND LYMPHATIC: Denies easy bruising or bleeding.  SKIN: Denies rash or lesions.  MUSCULOSKELETAL: Denies pain in neck, back, shoulder, knees, hips, or further arthritic symptoms.  NEUROLOGIC: Denies paralysis, paresthesias.  PSYCHIATRIC: Denies anxiety or depressive symptoms. Otherwise full review of systems performed by me is negative.   MEDICATIONS AT HOME:   Prior to Admission medications   Medication Sig Start Date End Date Taking? Authorizing Provider  ENSURE (ENSURE) Take 237 mLs by mouth daily.   Yes Historical Provider, MD  furosemide (LASIX) 40 MG tablet Take 40 mg by mouth daily.   Yes Historical Provider, MD  hydroxychloroquine (PLAQUENIL) 200 MG tablet Take 200 mg by mouth daily.   Yes Historical Provider, MD  hydroxypropyl methylcellulose / hypromellose (ISOPTO TEARS / GONIOVISC) 2.5 % ophthalmic solution Place 1 drop into both eyes daily as needed for dry eyes.   Yes Historical Provider, MD  levothyroxine (SYNTHROID, LEVOTHROID) 50 MCG tablet Take 50 mcg by mouth daily before breakfast.   Yes Historical Provider, MD  losartan (COZAAR) 50 MG tablet Take 50 mg by mouth daily.   Yes Historical Provider, MD  magnesium sulfate (EPSOM SALTS) crystals Apply 1 application topically daily. 1 cup as directed to soak foot every day   Yes Historical Provider, MD  Menthol, Topical Analgesic, (BENGAY VANISHING SCENT EX) Apply 1 application topically 4 (four) times daily.   Yes Historical Provider, MD  PARoxetine (PAXIL) 10 MG tablet Take 10 mg by mouth daily.   Yes Historical Provider, MD  VITAL SIGNS:  Blood pressure 184/68, pulse 71, temperature 98.5 F (36.9 C), temperature source Oral, resp. rate 17, height 5\' 2"  (1.575 m), weight 135 lb (61.236 kg), SpO2 100 %.  PHYSICAL EXAMINATION:  VITAL SIGNS: Filed Vitals:   08/10/14 2032  BP: 184/68  Pulse: 71   Temp:   Resp: 17   GENERAL:81 y.o.femaleweek/currently ill-appearing HEAD: Normocephalic, atraumatic.  EYES: Pupils equal, round, reactive to light. Extraocular muscles intact. No scleral icterus.  MOUTH: Moist mucosal membrane. Dentition intact. No abscess noted.  EAR, NOSE, THROAT: Clear without exudates. No external lesions.  NECK: Supple. No thyromegaly. No nodules. No JVD.  PULMONARY:Diffuse coarse rhonchi without wheeze. No use of accessory muscles, Good respiratory effort. good air entry bilaterally CHEST: Nontender to palpation.  CARDIOVASCULAR: S1 and S2. Regular rate and rhythm. No murmurs, rubs, or gallops.1+  edema Bilaterally. Pedal pulses 2+ bilaterally.  GASTROINTESTINAL: Soft, nontender, nondistended. No masses. Positive bowel sounds. No hepatosplenomegaly.  MUSCULOSKELETAL: No swelling, clubbing, or edema. Range of motion full in all extremities.  NEUROLOGIC: Cranial nerves II through XII are intact. No gross focal neurological deficits. Sensation intact. Reflexes intact.  SKIN: No ulceration, lesions, rashes, or cyanosis. Skin warm and dry. Turgor intact.  PSYCHIATRIC: Mood, affect within normal limits. The patient is awake, alert and oriented x 3. Insight, judgment intact.    LABORATORY PANEL:   CBC  Recent Labs Lab 08/10/14 1645  WBC 10.4  HGB 12.1  HCT 37.1  PLT 198   ------------------------------------------------------------------------------------------------------------------  Chemistries   Recent Labs Lab 08/10/14 1645  NA 139  K 4.8  CL 99*  CO2 29  GLUCOSE 129*  BUN 29*  CREATININE 0.96  CALCIUM 9.2  AST 21  ALT 11*  ALKPHOS 83  BILITOT 0.3   ------------------------------------------------------------------------------------------------------------------  Cardiac Enzymes  Recent Labs Lab 08/10/14 1645  TROPONINI 0.03    ------------------------------------------------------------------------------------------------------------------  RADIOLOGY:  Dg Chest 2 View  08/10/2014   CLINICAL DATA:  Shortness of breath, cough, has felt poorly for a week, history pneumonia, COPD, CHF, hypertension  EXAM: CHEST  2 VIEW  COMPARISON:  03/27/2014  FINDINGS: Borderline enlargement of cardiac silhouette.  Tortuosity of thoracic aorta with atherosclerotic calcification.  Slight pulmonary vascular congestion.  Peribronchial thickening with diffuse interstitial prominence little changed from previous exam, question minimal edema or less likely infiltrate.  Mild atelectasis at RIGHT base.  Remaining lungs clear.  No pleural effusion or pneumothorax.  Bones demineralized.  Prior vertebroplasty at thoracolumbar junction.  IMPRESSION: Enlargement of cardiac silhouette with pulmonary vascular congestion and diffuse interstitial prominence, raising question of minimal pulmonary edema.   Electronically Signed   By: 03/29/2014 M.D.   On: 08/10/2014 17:38    EKG:  No orders found for this or any previous visit.  IMPRESSION AND PLAN:   81 year old Caucasian female history of diastolic congestive heart as well as COPD oxygen requiring presented with a one-week duration shortness of breath.  1. Acute on chronic diastolic congestive heart failure: Admit to telemetry and initiate diuresis with Lasix and provide supplemental O2 to keep oxygen saturations greater than 92%, and DuoNeb treatments every 4 hours, trend cardiac enzymes 3, consult cardiology. Previously followed with Dr.: 99 per documentation. 2. Hypertensive urgency: Continue home antihypertensive add hydralazine when necessary as needed for systolic greater than 180 or diastolic greater than 100 3. Hypothyroidism, unspecified: Continue with Synthroid. 4. Rheumatoid arthritis: Continue Plaquenil 5. Venous thromboembolism prophylactic: Heparin subcutaneous  All the records are  reviewed and case discussed with ED  provider. Management plans discussed with the patient, family and they are in agreement.  CODE STATUS: Full  TOTAL TIME TAKING CARE OF THIS PATIENT: 35  minutes.    Hower,  Mardi Mainland.D on 08/10/2014 at 8:46 PM  Between 7am to 6pm - Pager - [phone]  After 6pm: House Pager: - [phone]  [facility]  Office  [phone]  CC: Primary care physician; Leanna Sato, MD

## 2014-08-10 NOTE — ED Notes (Signed)
PAtient to ED from [facility] with c/o SOB for one week. Patient states she has noted an increase of difficulty breathing upon exertion. Patient is able to answer questions and follow instructions.

## 2014-08-10 NOTE — ED Notes (Signed)
Pt's o2sat drop to 88%.  Pt placed on 2L [place]

## 2014-08-10 NOTE — ED Provider Notes (Signed)
[facility] Emergency Department Provider Note  ____________________________________________  Time seen: 5 PM  I have reviewed the triage vital signs and the nursing notes.   HISTORY  Chief Complaint Shortness of Breath      HPI Karen Bryan is a 81 y.o. female presents with painless dyspnea on exertion times one week. Positive nonproductive cough negative fever. Patient admits to chest tightness. That is central nonradiating currently 4 out of 10.     Past Medical History  Diagnosis Date  . COPD (chronic obstructive pulmonary disease)   . CHF (congestive heart failure)   . Hypertension     There are no active problems to display for this patient.   History reviewed. No pertinent past surgical history.  No current outpatient prescriptions on file.  Allergies Review of patient's allergies indicates no known allergies.  History reviewed. No pertinent family history.  Social History History  Substance Use Topics  . Smoking status: Unknown If Ever Smoked  . Smokeless tobacco: Not on file  . Alcohol Use: No    Review of Systems  Constitutional: Negative for fever. Eyes: Negative for visual changes. ENT: Negative for sore throat. Cardiovascular: Negative for chest pain. Respiratory: Negative for shortness of breath. Gastrointestinal: Negative for abdominal pain, vomiting and diarrhea. Genitourinary: Negative for dysuria. Musculoskeletal: Negative for back pain. Skin: Negative for rash. Neurological: Negative for headaches, focal weakness or numbness. Psychiatric: Unremarkable  10-point ROS otherwise negative.  ____________________________________________   PHYSICAL EXAM:  VITAL SIGNS: ED Triage Vitals  Enc Vitals Group     BP 08/10/14 1637 197/78 mmHg     Pulse Rate 08/10/14 1637 74     Resp 08/10/14 1700 21     Temp 08/10/14 1637 98.5 F (36.9 C)     Temp Source 08/10/14 1637 Oral     SpO2 08/10/14 1637 100 %   Weight 08/10/14 1637 135 lb (61.236 kg)     Height 08/10/14 1637 5\' 2"  (1.575 m)     Head Cir --      Peak Flow --      Pain Score --      Pain Loc --      Pain Edu? --      Excl. in GC? --      Constitutional: Alert and oriented. Well appearing and in no distress. Eyes: Conjunctivae are normal. PERRL. Normal extraocular movements. ENT   Head: Normocephalic and atraumatic.   Nose: No congestion/rhinnorhea.   Mouth/Throat: Mucous membranes are moist.   Neck: No stridor. Hematological/Lymphatic/Immunilogical: No cervical lymphadenopathy. Cardiovascular: Normal rate, regular rhythm. Normal and symmetric distal pulses are present in all extremities. No murmurs, rubs, or gallops. Respiratory: Normal respiratory effort without tachypnea nor retractions. Breath sounds are clear and equal bilaterally. Mild bibasilar rales Gastrointestinal: Soft and nontender. No distention. There is no CVA tenderness. Genitourinary: deferred Musculoskeletal: Nontender with normal range of motion in all extremities. No joint effusions.  No lower extremity tenderness nor edema. Neurologic:  Normal speech and language. No gross focal neurologic deficits are appreciated. Speech is normal.  Skin:  Skin is warm, dry and intact. No rash noted. Psychiatric: Mood and affect are normal. Speech and behavior are normal. Patient exhibits appropriate insight and judgment.  ____________________________________________    LABS (pertinent positives/negatives)   Date: 08/10/2014  Rate: 74  Rhythm: normal sinus rhythm  QRS Axis: normal  Intervals: normal  ST/T Wave abnormalities: normal  Conduction Disutrbances: none  Narrative Interpretation: unremarkable  ____________________________________________   EKG   Date: 08/14/2014  Rate:   Rhythm: normal sinus rhythm  QRS Axis: normal  Intervals: normal  ST/T Wave abnormalities: normal  Conduction Disutrbances: none  Narrative  Interpretation: unremarkable      ____________________________________________    RADIOLOGY    ____________________________________________    ____________________________________________   INITIAL IMPRESSION / ASSESSMENT AND PLAN / ED COURSE  Pertinent labs & imaging results that were available during my care of the patient were reviewed by me and considered in my medical decision making (see chart for details).  Patient acute on chronic congestive heart failure. Lasix 40 mg IV given  ____________________________________________   FINAL CLINICAL IMPRESSION(S) / ED DIAGNOSES  Final diagnoses:  Congestive heart failure due to valvular disease, acute on chronic, systolic      Darci Current, MD 08/14/14 [phone]

## 2014-08-11 DIAGNOSIS — I5033 Acute on chronic diastolic (congestive) heart failure: Secondary | ICD-10-CM | POA: Diagnosis not present

## 2014-08-11 LAB — CBC
HCT: 32.3 % — ABNORMAL LOW (ref 35.0–47.0)
Hemoglobin: 10.3 g/dL — ABNORMAL LOW (ref 12.0–16.0)
MCH: 29.4 pg (ref 26.0–34.0)
MCHC: 32 g/dL (ref 32.0–36.0)
MCV: 91.9 fL (ref 80.0–100.0)
PLATELETS: 215 10*3/uL (ref 150–440)
RBC: 3.51 MIL/uL — ABNORMAL LOW (ref 3.80–5.20)
RDW: 14.9 % — ABNORMAL HIGH (ref 11.5–14.5)
WBC: 5.7 10*3/uL (ref 3.6–11.0)

## 2014-08-11 LAB — BASIC METABOLIC PANEL
ANION GAP: 8 (ref 5–15)
BUN: 30 mg/dL — ABNORMAL HIGH (ref 6–20)
CALCIUM: 8.5 mg/dL — AB (ref 8.9–10.3)
CO2: 31 mmol/L (ref 22–32)
Chloride: 100 mmol/L — ABNORMAL LOW (ref 101–111)
Creatinine, Ser: 0.98 mg/dL (ref 0.44–1.00)
GFR calc Af Amer: 56 mL/min — ABNORMAL LOW (ref >60)
GFR, EST NON AFRICAN AMERICAN: 49 mL/min — AB (ref >60)
Glucose, Bld: 115 mg/dL — ABNORMAL HIGH (ref 65–99)
Potassium: 4.1 mmol/L (ref 3.5–5.1)
Sodium: 139 mmol/L (ref 135–145)

## 2014-08-11 LAB — TROPONIN I
TROPONIN I: 0.01 ng/mL (ref <0.031)
Troponin I: 0.03 ng/mL (ref <0.031)

## 2014-08-11 MED ORDER — IPRATROPIUM-ALBUTEROL 0.5-2.5 (3) MG/3ML IN SOLN
3.0000 mL | Freq: Four times a day (QID) | RESPIRATORY_TRACT | Status: DC
  Administered 2014-08-12 – 2014-08-14 (×10): 3 mL via RESPIRATORY_TRACT
  Filled 2014-08-11 (×10): qty 3

## 2014-08-11 MED ORDER — POTASSIUM CHLORIDE CRYS ER 20 MEQ PO TBCR
40.0000 meq | EXTENDED_RELEASE_TABLET | Freq: Every day | ORAL | Status: DC
  Administered 2014-08-11 – 2014-08-12 (×2): 40 meq via ORAL
  Filled 2014-08-11 (×3): qty 2

## 2014-08-11 MED ORDER — FUROSEMIDE 10 MG/ML IJ SOLN
40.0000 mg | Freq: Three times a day (TID) | INTRAMUSCULAR | Status: DC
Start: 2014-08-11 — End: 2014-08-12
  Administered 2014-08-11 – 2014-08-12 (×3): 40 mg via INTRAVENOUS
  Filled 2014-08-11 (×3): qty 4

## 2014-08-11 NOTE — Consult Note (Signed)
Patient ID: Karen Bryan MRN: [id number] DOB/AGE: 1919-10-02 81 y.o.  Admit date: 08/10/2014 Primary Physician: Dr. Marvis Moeller Primary Cardiologist: Dr Mariah Milling Reason for Consultation: Acute on chronic diastolic CHF.   HPI: 81 yo with history of chronic diastolic CHF, chronic respiratory failure on 2 L home oxygen but never smoked, recurrent PNA with possible aspiration, and RA presented with dyspnea and suspected acute on chronic diastolic CHF.  She has a history of diastolic CHF, last hospitalized in 12/15 for this.  She also has had recurrent PNA episodes concerning for possible aspiration.    Over the last week, she has become progressively short of breath. Yesterday she was short of breath at rest and had associated chest tightness.  She came to the ER and was admitted.  ECG without ischemic changes, troponin negative x 2.  She has been started on IV Lasix and is beginning to feel better.  WBCs not significantly elevated and no sign of PNA on CXR. History difficult as patient is very hard of hearing.    Review of systems complete and found to be negative unless listed above in HPI  Past Medical History: 1. Chronic diastolic CHF: Echo (12/15) with EF 60-65%, PASP 38 mmHg.  2. Chronic respiratory failure on 2 L home oxygen.  Never smoked.  3. PUD 4. Recurrent PNA: Suspect aspiration 5. HTN 6. Rheumatoid arthritis 7. Osteoporosis 8. Chronic venous insufficiency  FH: CAD  History   Social History  . Marital Status: Widowed    Spouse Name: N/A  . Number of Children: N/A  . Years of Education: N/A   Occupational History  . Not on file.   Social History Main Topics  . Smoking status: Never Smoker   . Smokeless tobacco: Not on file  . Alcohol Use: No  . Drug Use: No  . Sexual Activity: Not on file   Other Topics Concern  . Not on file   Social History Narrative  . No narrative on file     Prescriptions prior to admission  Medication Sig Dispense Refill Last Dose    . ENSURE (ENSURE) Take 237 mLs by mouth daily.   08/10/2014 at Unknown time  . furosemide (LASIX) 40 MG tablet Take 40 mg by mouth daily.   08/10/2014 at Unknown time  . hydroxychloroquine (PLAQUENIL) 200 MG tablet Take 200 mg by mouth daily.   08/10/2014 at Unknown time  . hydroxypropyl methylcellulose / hypromellose (ISOPTO TEARS / GONIOVISC) 2.5 % ophthalmic solution Place 1 drop into both eyes daily as needed for dry eyes.   unknown at unknown  . levothyroxine (SYNTHROID, LEVOTHROID) 50 MCG tablet Take 50 mcg by mouth daily before breakfast.   08/10/2014 at Unknown time  . losartan (COZAAR) 50 MG tablet Take 50 mg by mouth daily.   08/10/2014 at Unknown time  . magnesium sulfate (EPSOM SALTS) crystals Apply 1 application topically daily. 1 cup as directed to soak foot every day   Past Week at Unknown time  . Menthol, Topical Analgesic, (BENGAY VANISHING SCENT EX) Apply 1 application topically 4 (four) times daily.   08/10/2014 at Unknown time  . PARoxetine (PAXIL) 10 MG tablet Take 10 mg by mouth daily.   08/10/2014 at Unknown time   Current Scheduled Meds: . furosemide  40 mg Intravenous 3 times per day  . heparin  5,000 Units Subcutaneous 3 times per day  . hydroxychloroquine  200 mg Oral Daily  . ipratropium-albuterol  3 mL Nebulization Q4H  .  levothyroxine  50 mcg Oral QAC breakfast  . losartan  50 mg Oral Daily  . magnesium sulfate  1 application Topical Daily  . PARoxetine  10 mg Oral Daily  . potassium chloride  40 mEq Oral Daily  . sodium chloride  3 mL Intravenous Q12H   Continuous Infusions:  PRN Meds:.acetaminophen **OR** acetaminophen, hydrALAZINE, hydroxypropyl methylcellulose / hypromellose, morphine injection   Physical exam Blood pressure 153/53, pulse 71, temperature 97.7 F (36.5 C), temperature source Oral, resp. rate 18, height 5\' 2"  (1.575 m), weight 133 lb 4.8 oz (60.464 kg), SpO2 97 %. General: NAD Neck: JVP 10-12 cm, no thyromegaly or thyroid nodule.  Lungs: Crackles at  bases bilaterally. CV: Nondisplaced PMI.  Heart regular S1/S2, no S3/S4, no murmur.  Trace ankle edema.  No carotid bruit.  Normal pedal pulses.  Abdomen: Soft, nontender, no hepatosplenomegaly, no distention.  Skin: Intact without lesions or rashes.  Neurologic: Alert and oriented x 3.  Psych: Normal affect. Extremities: No clubbing or cyanosis.  HEENT: Normal.   Labs:   Lab Results  Component Value Date   WBC 5.7 08/11/2014   HGB 10.3* 08/11/2014   HCT 32.3* 08/11/2014   MCV 91.9 08/11/2014   PLT 215 08/11/2014    Recent Labs Lab 08/10/14 1645 08/11/14 0227  NA 139 139  K 4.8 4.1  CL 99* 100*  CO2 29 31  BUN 29* 30*  CREATININE 0.96 0.98  CALCIUM 9.2 8.5*  PROT 7.6  --   BILITOT 0.3  --   ALKPHOS 83  --   ALT 11*  --   AST 21  --   GLUCOSE 129* 115*   Lab Results  Component Value Date   CKTOTAL 114 08/10/2014   CKMB 1.8 08/10/2014   TROPONINI 0.01 08/11/2014   Radiology: - CXR: Mild pulmonary edema  EKG: NSR with low voltage Ps, LVH  ASSESSMENT AND PLAN: 81 yo with history of chronic diastolic CHF, chronic respiratory failure on 2 L home oxygen but never smoked, recurrent PNA with possible aspiration, and RA presented with dyspnea and suspected acute on chronic diastolic CHF.  1. Acute on chronic diastolic CHF: Patient is volume overloaded on exam with JVD and lung crackles.   - Lasix 40 mg IV every 8 hrs, replete K.  - Follow creatinine closely with diuresis.  2. H/o PNA: No evidence for PNA on CXR and WBCs not significantly elevated.  3. PT consult, mobilize.   Signed: 99 08/11/2014 12:20 PM

## 2014-08-11 NOTE — Progress Notes (Signed)
Alert and oriented. Sinus rhythm on telemetry. Rested quietly most of the day. No complaints. Family at bedside today. Full assessment as charted. Stand by assist to [facility] - IV lasix. Adella Nissen

## 2014-08-11 NOTE — Progress Notes (Signed)
Remains hard of hearing.  Breathing better with less shortness of breath on exertion.  SR continues on telemetry.

## 2014-08-11 NOTE — Progress Notes (Signed)
[facility] - [place] at [facility]                                                                                                                                                                                            Patient Demographics   Karen Bryan, is a 81 y.o. female, DOB - [birth year], RPR:[id number]  Admit date - 08/10/2014   Admitting Physician Wyatt Haste, MD  Outpatient Primary MD for the patient is Leanna Sato, MD  LOS -   Chief Complaint  Patient presents with  . Shortness of Breath      This morning patient's breathing is improved. She is hard of hearing. Denies any chest pains any coughing or wheezing   Review of Systems:   CONSTITUTIONAL: No documented fever. No fatigue, weakness. No weight gain, no weight loss.  EYES: No blurry or double vision.  ENT: No tinnitus. No postnasal drip. No redness of the oropharynx.  RESPIRATORY: No cough, no wheeze, no hemoptysis. No dyspnea. + Sob CARDIOVASCULAR: No chest pain. +orthopnea. No palpitations. No syncope.  GASTROINTESTINAL: No nausea, no vomiting or diarrhea. No abdominal pain. No melena or hematochezia.  GENITOURINARY: No dysuria or hematuria.  ENDOCRINE: No polyuria or nocturia. No heat or cold intolerance.  HEMATOLOGY: No anemia. No bruising. No bleeding.  INTEGUMENTARY: No rashes. No lesions.  MUSCULOSKELETAL: No arthritis. No swelling. No gout.  NEUROLOGIC: No numbness, tingling, or ataxia. No seizure-type activity.  PSYCHIATRIC: No anxiety. No insomnia. No ADD.    Vitals:   Filed Vitals:   08/11/14 0458 08/11/14 0808 08/11/14 1013 08/11/14 1206  BP: 130/35  153/53 136/31  Pulse: 71   74  Temp:    98.3 F (36.8 C)  TempSrc:    Oral  Resp: 18   22  Height:      Weight: 60.464 kg (133 lb 4.8 oz)     SpO2: 97% 97%  100%    Wt Readings from Last 3 Encounters:  08/11/14 60.464 kg (133 lb 4.8 oz)     Intake/Output Summary (Last 24 hours) at 08/11/14 1231 Last data filed  at 08/11/14 1224  Gross per 24 hour  Intake    120 ml  Output    650 ml  Net   -530 ml    Physical Exam:   GENERAL: Pleasant-appearing in no apparent distress.  HEAD, EYES, EARS, NOSE AND THROAT: Atraumatic, normocephalic. Extraocular muscles are intact. Pupils equal and reactive to light. Sclerae anicteric. No conjunctival injection. No oro-pharyngeal erythema.  NECK: Supple. There is no jugular venous distention. No bruits, no lymphadenopathy, no thyromegaly.  HEART: Regular  rate and rhythm, tachycardic. No murmurs, no rubs, no clicks.  LUNGS: Bilateral crackles without assists her emotional usage.  ABDOMEN: Soft, flat, nontender, nondistended. Has good bowel sounds. No hepatosplenomegaly appreciated.  EXTREMITIES: No evidence of any cyanosis, clubbing, or peripheral edema.  +2 pedal and radial pulses bilaterally.  NEUROLOGIC: The patient is alert, awake, and oriented x3 with no focal motor or sensory deficits appreciated bilaterally.  SKIN: Moist and warm with no rashes appreciated.     Antibiotics   Anti-infectives    Start     Dose/Rate Route Frequency Ordered Stop   08/11/14 1000  hydroxychloroquine (PLAQUENIL) tablet 200 mg     200 mg Oral Daily 08/10/14 2237        Medications   Scheduled Meds: . furosemide  40 mg Intravenous 3 times per day  . heparin  5,000 Units Subcutaneous 3 times per day  . hydroxychloroquine  200 mg Oral Daily  . ipratropium-albuterol  3 mL Nebulization Q4H  . levothyroxine  50 mcg Oral QAC breakfast  . losartan  50 mg Oral Daily  . magnesium sulfate  1 application Topical Daily  . PARoxetine  10 mg Oral Daily  . potassium chloride  40 mEq Oral Daily  . sodium chloride  3 mL Intravenous Q12H   Continuous Infusions:  PRN Meds:.acetaminophen **OR** acetaminophen, hydrALAZINE, hydroxypropyl methylcellulose / hypromellose, morphine injection   Data Review:   Micro Results No results found for this or any previous visit (from the past 240  hour(s)).  Radiology Reports Dg Chest 2 View  08/10/2014   CLINICAL DATA:  Shortness of breath, cough, has felt poorly for a week, history pneumonia, COPD, CHF, hypertension  EXAM: CHEST  2 VIEW  COMPARISON:  03/27/2014  FINDINGS: Borderline enlargement of cardiac silhouette.  Tortuosity of thoracic aorta with atherosclerotic calcification.  Slight pulmonary vascular congestion.  Peribronchial thickening with diffuse interstitial prominence little changed from previous exam, question minimal edema or less likely infiltrate.  Mild atelectasis at RIGHT base.  Remaining lungs clear.  No pleural effusion or pneumothorax.  Bones demineralized.  Prior vertebroplasty at thoracolumbar junction.  IMPRESSION: Enlargement of cardiac silhouette with pulmonary vascular congestion and diffuse interstitial prominence, raising question of minimal pulmonary edema.   Electronically Signed   By: Ulyses Southward M.D.   On: 08/10/2014 17:38     CBC  Recent Labs Lab 08/10/14 1645 08/11/14 0227  WBC 10.4 5.7  HGB 12.1 10.3*  HCT 37.1 32.3*  PLT 198 215  MCV 92.0 91.9  MCH 29.9 29.4  MCHC 32.6 32.0  RDW 15.6* 14.9*    Chemistries   Recent Labs Lab 08/10/14 1645 08/11/14 0227  NA 139 139  K 4.8 4.1  CL 99* 100*  CO2 29 31  GLUCOSE 129* 115*  BUN 29* 30*  CREATININE 0.96 0.98  CALCIUM 9.2 8.5*  AST 21  --   ALT 11*  --   ALKPHOS 83  --   BILITOT 0.3  --    ------------------------------------------------------------------------------------------------------------------ estimated creatinine clearance is 31.4 mL/min (by C-G formula based on Cr of 0.98). ------------------------------------------------------------------------------------------------------------------ No results for input(s): HGBA1C in the last 72 hours. ------------------------------------------------------------------------------------------------------------------ No results for input(s): CHOL, HDL, LDLCALC, TRIG, CHOLHDL, LDLDIRECT  in the last 72 hours. ------------------------------------------------------------------------------------------------------------------ No results for input(s): TSH, T4TOTAL, T3FREE, THYROIDAB in the last 72 hours.  Invalid input(s): FREET3 ------------------------------------------------------------------------------------------------------------------ No results for input(s): VITAMINB12, FOLATE, FERRITIN, TIBC, IRON, RETICCTPCT in the last 72 hours.  Coagulation profile No results for input(s): INR, PROTIME  in the last 168 hours.  No results for input(s): DDIMER in the last 72 hours.  Cardiac Enzymes  Recent Labs Lab 08/10/14 1645 08/11/14 0225 08/11/14 0831  CKMB 1.8  --   --   TROPONINI 0.03 0.03 0.01   ------------------------------------------------------------------------------------------------------------------ Invalid input(s): POCBNP    Assessment & Plan   81 year old Caucasian female history of diastolic congestive heart as well as COPD oxygen requiring presented with a one-week duration shortness of breath.  1. Acute on chronic diastolic congestive heart failure: Continue iv lasix, cardiology consult, h/o copd placed on nebs as well, seems to have improved today 2. Hypertensive urgency: Continue losartan,Hydralazine when necessary as needed for systolic greater than 180 or diastolic greater than 100 3. Hypothyroidism, unspecified: Continue with Synthroid. 4. Rheumatoid arthritis: Continue Plaquenil 5. Venous thromboembolism prophylactic: Heparin subcutaneous      Code Status Orders        Start     Ordered   08/10/14 2018  Full code   Continuous     08/10/14 2018      Family Communication: daugher in Social worker  Disposition Plan: Assiated living     Consults  cardiology   DVT Prophylaxis    Heparin -   Lab Results  Component Value Date   PLT 215 08/11/2014     Time Spent in minutes     Auburn Bilberry M.D on 08/11/2014 at 12:31  PM  Between 7am to 6pm - Pager - [phone]  After 6pm go to [email or web address] - password [email or web address]  [facility] [employer] [facility]   Office  [phone]

## 2014-08-11 NOTE — Progress Notes (Signed)
Pt admitted from [facility]. Anticipate return to [facility]. FL2 on chart. Weekday CSW to f/u with [facility] to confirm pt is able to return at d/c. CSW will follow. Dellie Burns, MSW, LCSW [phone] (weekend coverage)

## 2014-08-12 ENCOUNTER — Observation Stay: Payer: Medicare Other

## 2014-08-12 DIAGNOSIS — Z66 Do not resuscitate: Secondary | ICD-10-CM | POA: Diagnosis present

## 2014-08-12 DIAGNOSIS — I10 Essential (primary) hypertension: Secondary | ICD-10-CM | POA: Diagnosis present

## 2014-08-12 DIAGNOSIS — I5031 Acute diastolic (congestive) heart failure: Secondary | ICD-10-CM

## 2014-08-12 DIAGNOSIS — I5023 Acute on chronic systolic (congestive) heart failure: Secondary | ICD-10-CM | POA: Diagnosis present

## 2014-08-12 DIAGNOSIS — J449 Chronic obstructive pulmonary disease, unspecified: Secondary | ICD-10-CM | POA: Diagnosis present

## 2014-08-12 DIAGNOSIS — M069 Rheumatoid arthritis, unspecified: Secondary | ICD-10-CM | POA: Diagnosis present

## 2014-08-12 DIAGNOSIS — Z9981 Dependence on supplemental oxygen: Secondary | ICD-10-CM | POA: Diagnosis not present

## 2014-08-12 DIAGNOSIS — Z8701 Personal history of pneumonia (recurrent): Secondary | ICD-10-CM | POA: Diagnosis not present

## 2014-08-12 DIAGNOSIS — Z8711 Personal history of peptic ulcer disease: Secondary | ICD-10-CM | POA: Diagnosis not present

## 2014-08-12 DIAGNOSIS — J9610 Chronic respiratory failure, unspecified whether with hypoxia or hypercapnia: Secondary | ICD-10-CM | POA: Diagnosis present

## 2014-08-12 DIAGNOSIS — I872 Venous insufficiency (chronic) (peripheral): Secondary | ICD-10-CM | POA: Diagnosis present

## 2014-08-12 DIAGNOSIS — E039 Hypothyroidism, unspecified: Secondary | ICD-10-CM | POA: Diagnosis present

## 2014-08-12 DIAGNOSIS — M810 Age-related osteoporosis without current pathological fracture: Secondary | ICD-10-CM | POA: Diagnosis present

## 2014-08-12 DIAGNOSIS — I5033 Acute on chronic diastolic (congestive) heart failure: Secondary | ICD-10-CM | POA: Diagnosis not present

## 2014-08-12 DIAGNOSIS — H9190 Unspecified hearing loss, unspecified ear: Secondary | ICD-10-CM | POA: Diagnosis present

## 2014-08-12 DIAGNOSIS — Z8249 Family history of ischemic heart disease and other diseases of the circulatory system: Secondary | ICD-10-CM | POA: Diagnosis not present

## 2014-08-12 LAB — BASIC METABOLIC PANEL
ANION GAP: 8 (ref 5–15)
BUN: 36 mg/dL — ABNORMAL HIGH (ref 6–20)
CHLORIDE: 98 mmol/L — AB (ref 101–111)
CO2: 33 mmol/L — AB (ref 22–32)
CREATININE: 1.05 mg/dL — AB (ref 0.44–1.00)
Calcium: 8.6 mg/dL — ABNORMAL LOW (ref 8.9–10.3)
GFR calc Af Amer: 52 mL/min — ABNORMAL LOW (ref >60)
GFR calc non Af Amer: 45 mL/min — ABNORMAL LOW (ref >60)
Glucose, Bld: 128 mg/dL — ABNORMAL HIGH (ref 65–99)
POTASSIUM: 4.7 mmol/L (ref 3.5–5.1)
Sodium: 139 mmol/L (ref 135–145)

## 2014-08-12 MED ORDER — FUROSEMIDE 10 MG/ML IJ SOLN
40.0000 mg | Freq: Two times a day (BID) | INTRAMUSCULAR | Status: DC
  Administered 2014-08-12: 40 mg via INTRAVENOUS
  Filled 2014-08-12 (×2): qty 4

## 2014-08-12 NOTE — Care Management Note (Signed)
Case Management Note  Patient Details  Name: Karen Bryan MRN: [id number] Date of Birth: [birth year]  Subjective/Objective:                 Presents from [facility].  Patient ambulates with aid of [facility].  She is able to feed herself and does require assistance with her bath.  Per Crystal, can anticipate patient returning to the facility unless there is a dramatic decline in her functional status.  Patient admitted with chf and currently requiring IV lasix.  She does not have chronic home 02.  Agreeable to home health nurse follow up at discharge.  Agency preference for facility is [facility].  Discussed that [facility] is not in network with patient's insurance.  [facility] to [facility]  Heads up referral to [facility] for nursing   Action/Plan:   Expected Discharge Date:                  Expected Discharge Plan:  Assisted Living / Rest Home (WITH HOME HEALTH NURSING)  In-House Referral:  NA  Discharge planning Services  CM Consult  Post Acute Care Choice:  Home Health Choice offered to:  NA (Assisted living uses certain agencies)  DME Arranged:    DME Agency:     HH Arranged:  RN HH Agency:  [facility]  Status of Service:     Medicare Important Message Given:  Yes Date Medicare IM Given:  08/12/14 Medicare IM give by:  Ermalene Searing Date Additional Medicare IM Given:    Additional Medicare Important Message give by:     If discussed at Long Length of Stay Meetings, dates discussed:    Additional Comments:  Eber Hong, RN 08/12/2014, 12:34 PM

## 2014-08-12 NOTE — Clinical Social Work Note (Signed)
Clinical Social Work Assessment  Patient Details  Name: Karen Bryan MRN: [id number] Date of Birth: [birth year]  Date of referral:  08/12/14               Reason for consult:  Facility Placement (pt is from [facility])                Permission sought to share information with:  Facility Medical sales representative, Family Supports Permission granted to share information::  Yes, Verbal Permission Granted  Name::        Agency::     Relationship::     Contact Information:     Housing/Transportation Living arrangements for the past 2 months:  Assisted Living Facility Source of Information:  Patient, Facility, Case Manager Patient Interpreter Needed:  None Criminal Activity/Legal Involvement Pertinent to Current Situation/Hospitalization:  No - Comment as needed Significant Relationships:  Adult Children Lives with:    Do you feel safe going back to the place where you live?  Yes Need for family participation in patient care:  Yes (Comment)  Care giving concerns:  None expressed at this time   Office manager / plan:  CSW will assist in pt return to ALF  Employment status:  Retired Health and safety inspector:  Medicare PT Recommendations:    Information / Referral to community resources:     Patient/Family's Response to care:  Pt was in agreement with DC back to ALF  Patient/Family's Understanding of and Emotional Response to Diagnosis, Current Treatment, and Prognosis:  Pt was in agreement with DC back to ALF, CSW will notify pt's family in the morning.  Emotional Assessment Appearance:  Appears stated age Attitude/Demeanor/Rapport:   (pleasent) Affect (typically observed):    Orientation:  Oriented to Self, Oriented to Place, Oriented to  Time, Oriented to Situation Alcohol / Substance use:  Never Used Psych involvement (Current and /or in the community):  No (Comment)  Discharge Needs  Concerns to be addressed:    Readmission within the last 30 days:    Current  discharge risk:    Barriers to Discharge:      Chauncy Passy, LCSW 08/12/2014, 6:41 PM

## 2014-08-12 NOTE — Progress Notes (Signed)
Initial Nutrition Assessment  DOCUMENTATION CODES:     INTERVENTION:    NUTRITION DIAGNOSIS:   (None at this time) related to   as evidenced by  .    GOAL:  Patient will meet greater than or equal to 90% of their needs    MONITOR:  PO intake, Labs, I & O's  REASON FOR ASSESSMENT:   (diagnosis)    ASSESSMENT:  Pt admitted ith CHF, HTN urgency PMhx: COPD, CHF, HTN, RA  Labs:  Electrolyte and Renal Profile:    Recent Labs Lab 08/10/14 1645 08/11/14 0227 08/12/14 0437  BUN 29* 30* 36*  CREATININE 0.96 0.98 1.05*  NA 139 139 139  K 4.8 4.1 4.7   Glucose Profile: No results for input(s): GLUCAP in the last 72 hours.  Medications: lasix, KCL  Height:  Ht Readings from Last 1 Encounters:  08/10/14 5\' 2"  (1.575 m)    Weight:  Wt Readings from Last 1 Encounters:  08/12/14 132 lb 12.8 oz (60.238 kg)        Wt Readings from Last 10 Encounters:  08/12/14 132 lb 12.8 oz (60.238 kg)    BMI:  Body mass index is 24.28 kg/(m^2).  Estimated Nutritional Needs:  Kcal:     Protein:     Fluid:     Skin:  Reviewed, no issues  Diet Order:  Diet Heart Room service appropriate?: Yes; Fluid consistency:: Thin  EDUCATION NEEDS:  No education needs identified at this time   Intake/Output Summary (Last 24 hours) at 08/12/14 1520 Last data filed at 08/12/14 1457  Gross per 24 hour  Intake    240 ml  Output   2550 ml  Net  -2310 ml    Last BM:  5/8  Low level care  Leira Regino B. 7/8, RD, LDN [phone] (pager)

## 2014-08-12 NOTE — Progress Notes (Signed)
Patient: Karen Bryan / Admit Date: 08/10/2014 / Date of Encounter: 08/12/2014, 8:52 AM   Subjective: Continues to feel SOB and chest tightness this morning. CXR showed pulmonary vascular congestion and diffuse interstitial prominence. No chest pain. Very hard of hearing.   Review of Systems: Review of Systems  Constitutional: Positive for weight loss and malaise/fatigue. Negative for fever, chills and diaphoresis.  HENT: Positive for hearing loss.   Respiratory: Positive for cough and shortness of breath. Negative for hemoptysis, sputum production and wheezing.   Cardiovascular: Negative for chest pain, palpitations, orthopnea, claudication, leg swelling and PND.  Neurological: Positive for weakness.    Objective: Telemetry: NSR, 80s Physical Exam: Blood pressure 134/37, pulse 73, temperature 98.7 F (37.1 C), temperature source Oral, resp. rate 18, height 5\' 2"  (1.575 m), weight 132 lb 12.8 oz (60.238 kg), SpO2 93 %. Body mass index is 24.28 kg/(m^2). General: Well developed, well nourished, in no acute distress. Head: Normocephalic, atraumatic, sclera non-icteric, no xanthomas, nares are without discharge. HOH.  Neck: Negative for carotid bruits. JVP elevated. Lungs: Diffuse bilateral crackles. No wheezes or rhonchi. Breathing is unlabored. Heart: RRR S1 S2. 2/6 systolic murmurs at RUSB. No rub or gallop.  Abdomen: Soft, non-tender, non-distended with normoactive bowel sounds. No rebound/guarding. Extremities: No clubbing or cyanosis. No edema. Distal pedal pulses are 2+ and equal bilaterally. Neuro: Alert and oriented X 3. Moves all extremities spontaneously. Psych:  Responds to questions appropriately with a normal affect.   Intake/Output Summary (Last 24 hours) at 08/12/14 0852 Last data filed at 08/12/14 0700  Gross per 24 hour  Intake    240 ml  Output   2200 ml  Net  -1960 ml    Inpatient Medications:  . furosemide  40 mg Intravenous 3 times per day  . heparin   5,000 Units Subcutaneous 3 times per day  . hydroxychloroquine  200 mg Oral Daily  . ipratropium-albuterol  3 mL Nebulization QID  . levothyroxine  50 mcg Oral QAC breakfast  . losartan  50 mg Oral Daily  . magnesium sulfate  1 application Topical Daily  . PARoxetine  10 mg Oral Daily  . potassium chloride  40 mEq Oral Daily  . sodium chloride  3 mL Intravenous Q12H   Infusions:    Labs:  Recent Labs  08/11/14 0227 08/12/14 0437  NA 139 139  K 4.1 4.7  CL 100* 98*  CO2 31 33*  GLUCOSE 115* 128*  BUN 30* 36*  CREATININE 0.98 1.05*  CALCIUM 8.5* 8.6*    Recent Labs  08/10/14 1645  AST 21  ALT 11*  ALKPHOS 83  BILITOT 0.3  PROT 7.6  ALBUMIN 3.8    Recent Labs  08/10/14 1645 08/11/14 0227  WBC 10.4 5.7  HGB 12.1 10.3*  HCT 37.1 32.3*  MCV 92.0 91.9  PLT 198 215    Recent Labs  08/10/14 1645 08/11/14 0225 08/11/14 0831  CKTOTAL 114  --   --   CKMB 1.8  --   --   TROPONINI 0.03 0.03 0.01   Invalid input(s): POCBNP No results for input(s): HGBA1C in the last 72 hours.   Weights: Filed Weights   08/10/14 2212 08/11/14 0458 08/12/14 0532  Weight: 128 lb 11.2 oz (58.378 kg) 133 lb 4.8 oz (60.464 kg) 132 lb 12.8 oz (60.238 kg)     Radiology/Studies:  Dg Chest 2 View  08/10/2014   CLINICAL DATA:  Shortness of breath, cough, has felt poorly for  a week, history pneumonia, COPD, CHF, hypertension  EXAM: CHEST  2 VIEW  COMPARISON:  03/27/2014  FINDINGS: Borderline enlargement of cardiac silhouette.  Tortuosity of thoracic aorta with atherosclerotic calcification.  Slight pulmonary vascular congestion.  Peribronchial thickening with diffuse interstitial prominence little changed from previous exam, question minimal edema or less likely infiltrate.  Mild atelectasis at RIGHT base.  Remaining lungs clear.  No pleural effusion or pneumothorax.  Bones demineralized.  Prior vertebroplasty at thoracolumbar junction.  IMPRESSION: Enlargement of cardiac silhouette with  pulmonary vascular congestion and diffuse interstitial prominence, raising question of minimal pulmonary edema.   Electronically Signed   By: Ulyses Southward M.D.   On: 08/10/2014 17:38     Assessment and Plan  81 y.o. female with history of chronic diastolic CHF, chronic respiratory failure on 2 L home oxygen but never smoked, recurrent PNA with possible aspiration, and RA presented with dyspnea and suspected acute on chronic diastolic CHF.   1. Acute on chronic diastolic CHF: Patient is volume overloaded on exam with JVD and lung crackles.  -Continue IV Lasix 40 mg IV every 8 hrs -K+ stable.  -Follow creatinine closely with diuresis.   2. H/o PNA: No evidence for PNA on CXR and WBCs not significantly elevated. -Continue nebs   3. PT consult, mobilize.    Signed, Eula Listen, PA-C 08/12/2014 9:00 AM

## 2014-08-12 NOTE — Progress Notes (Addendum)
[facility] - [place] at [facility]                                                                                                                                                                                            Patient Demographics   Karen Bryan, is a 81 y.o. female, DOB - 14-May-1919, [id number]  Admit date - 08/10/2014   Admitting Physician Wyatt Haste, MD  Outpatient Primary MD for the patient is Leanna Sato, MD  LOS -   Chief Complaint  Patient presents with  . Shortness of Breath       Patient still sob, some cough  Review of Systems:   CONSTITUTIONAL: No documented fever. No fatigue, weakness. No weight gain, no weight loss.  EYES: No blurry or double vision.  ENT: No tinnitus. No postnasal drip. No redness of the oropharynx.  RESPIRATORY: No cough, no wheeze, no hemoptysis. No dyspnea. + Sob CARDIOVASCULAR: No chest pain. +orthopnea. No palpitations. No syncope.  GASTROINTESTINAL: No nausea, no vomiting or diarrhea. No abdominal pain. No melena or hematochezia.  GENITOURINARY: No dysuria or hematuria.  ENDOCRINE: No polyuria or nocturia. No heat or cold intolerance.  HEMATOLOGY: No anemia. No bruising. No bleeding.  INTEGUMENTARY: No rashes. No lesions.  MUSCULOSKELETAL: No arthritis. No swelling. No gout.  NEUROLOGIC: No numbness, tingling, or ataxia. No seizure-type activity.  PSYCHIATRIC: No anxiety. No insomnia. No ADD.    Vitals:   Filed Vitals:   08/11/14 2043 08/12/14 0532 08/12/14 0728 08/12/14 1119  BP:  134/37  122/38  Pulse:  73  72  Temp:  98.7 F (37.1 C)  98.3 F (36.8 C)  TempSrc:  Oral  Oral  Resp:  18  19  Height:      Weight:  60.238 kg (132 lb 12.8 oz)    SpO2: 96% 95% 93% 100%    Wt Readings from Last 3 Encounters:  08/12/14 60.238 kg (132 lb 12.8 oz)     Intake/Output Summary (Last 24 hours) at 08/12/14 1221 Last data filed at 08/12/14 1016  Gross per 24 hour  Intake    240 ml  Output    2700 ml  Net  -2460 ml    Physical Exam:   GENERAL: Pleasant-appearing in no apparent distress.  HEAD, EYES, EARS, NOSE AND THROAT: Atraumatic, normocephalic. Extraocular muscles are intact. Pupils equal and reactive to light. Sclerae anicteric. No conjunctival injection. No oro-pharyngeal erythema.  NECK: Supple. There is no jugular venous distention. No bruits, no lymphadenopathy, no thyromegaly.  HEART: Regular rate and rhythm, tachycardic. No murmurs, no rubs, no clicks.  LUNGS: Bilateral  crackles without  assesory muscle usage  ABDOMEN: Soft, flat, nontender, nondistended. Has good bowel sounds. No hepatosplenomegaly appreciated.  EXTREMITIES: No evidence of any cyanosis, clubbing, or peripheral edema.  +2 pedal and radial pulses bilaterally.  NEUROLOGIC: The patient is alert, awake, and oriented x3 with no focal motor or sensory deficits appreciated bilaterally.  SKIN: Moist and warm with no rashes appreciated.     Antibiotics   Anti-infectives    Start     Dose/Rate Route Frequency Ordered Stop   08/11/14 1000  hydroxychloroquine (PLAQUENIL) tablet 200 mg     200 mg Oral Daily 08/10/14 2237        Medications   Scheduled Meds: . furosemide  40 mg Intravenous BID  . heparin  5,000 Units Subcutaneous 3 times per day  . hydroxychloroquine  200 mg Oral Daily  . ipratropium-albuterol  3 mL Nebulization QID  . levothyroxine  50 mcg Oral QAC breakfast  . losartan  50 mg Oral Daily  . magnesium sulfate  1 application Topical Daily  . PARoxetine  10 mg Oral Daily  . potassium chloride  40 mEq Oral Daily  . sodium chloride  3 mL Intravenous Q12H   Continuous Infusions:  PRN Meds:.acetaminophen **OR** acetaminophen, hydrALAZINE, hydroxypropyl methylcellulose / hypromellose, morphine injection   Data Review:   Micro Results No results found for this or any previous visit (from the past 240 hour(s)).  Radiology Reports Dg Chest 2 View  08/12/2014   CLINICAL DATA:  Cardiac  silhouette is borderline enlarged. The aorta is uncoiled. No mediastinal or hilar masses or evidence of adenopathy.  EXAM: CHEST  2 VIEW  COMPARISON:  None.  FINDINGS: The heart size and mediastinal contours are within normal limits. Both lungs are clear. The visualized skeletal structures are unremarkable.  Lungs are mildly hyperexpanded. There is diffuse bilateral interstitial thickening similar to the prior exam. There are no areas of lung consolidation. No pleural effusion or pneumothorax.  Bony thorax is demineralized. There is a treated compression fracture at the thoracolumbar junction, stable.  IMPRESSION: 1. No change from the prior exam. 2. Bilateral interstitial thickening. This may all be chronic. Interstitial edema as a component should be considered given the history of shortness of breath. No evidence of lobar pneumonia.   Electronically Signed   By: Amie Portland M.D.   On: 08/12/2014 10:40   Dg Chest 2 View  08/10/2014   CLINICAL DATA:  Shortness of breath, cough, has felt poorly for a week, history pneumonia, COPD, CHF, hypertension  EXAM: CHEST  2 VIEW  COMPARISON:  03/27/2014  FINDINGS: Borderline enlargement of cardiac silhouette.  Tortuosity of thoracic aorta with atherosclerotic calcification.  Slight pulmonary vascular congestion.  Peribronchial thickening with diffuse interstitial prominence little changed from previous exam, question minimal edema or less likely infiltrate.  Mild atelectasis at RIGHT base.  Remaining lungs clear.  No pleural effusion or pneumothorax.  Bones demineralized.  Prior vertebroplasty at thoracolumbar junction.  IMPRESSION: Enlargement of cardiac silhouette with pulmonary vascular congestion and diffuse interstitial prominence, raising question of minimal pulmonary edema.   Electronically Signed   By: Ulyses Southward M.D.   On: 08/10/2014 17:38     CBC  Recent Labs Lab 08/10/14 1645 08/11/14 0227  WBC 10.4 5.7  HGB 12.1 10.3*  HCT 37.1 32.3*  PLT 198 215   MCV 92.0 91.9  MCH 29.9 29.4  MCHC 32.6 32.0  RDW 15.6* 14.9*    Chemistries   Recent Labs Lab 08/10/14 1645  08/11/14 0227 08/12/14 0437  NA 139 139 139  K 4.8 4.1 4.7  CL 99* 100* 98*  CO2 29 31 33*  GLUCOSE 129* 115* 128*  BUN 29* 30* 36*  CREATININE 0.96 0.98 1.05*  CALCIUM 9.2 8.5* 8.6*  AST 21  --   --   ALT 11*  --   --   ALKPHOS 83  --   --   BILITOT 0.3  --   --    ------------------------------------------------------------------------------------------------------------------ estimated creatinine clearance is 29.2 mL/min (by C-G formula based on Cr of 1.05). ------------------------------------------------------------------------------------------------------------------ No results for input(s): HGBA1C in the last 72 hours. ------------------------------------------------------------------------------------------------------------------ No results for input(s): CHOL, HDL, LDLCALC, TRIG, CHOLHDL, LDLDIRECT in the last 72 hours. ------------------------------------------------------------------------------------------------------------------ No results for input(s): TSH, T4TOTAL, T3FREE, THYROIDAB in the last 72 hours.  Invalid input(s): FREET3 ------------------------------------------------------------------------------------------------------------------ No results for input(s): VITAMINB12, FOLATE, FERRITIN, TIBC, IRON, RETICCTPCT in the last 72 hours.  Coagulation profile No results for input(s): INR, PROTIME in the last 168 hours.  No results for input(s): DDIMER in the last 72 hours.  Cardiac Enzymes  Recent Labs Lab 08/10/14 1645 08/11/14 0225 08/11/14 0831  CKMB 1.8  --   --   TROPONINI 0.03 0.03 0.01   ------------------------------------------------------------------------------------------------------------------ Invalid input(s): POCBNP    Assessment & Plan   81 year old Caucasian female history of diastolic congestive heart as well  as COPD oxygen requiring presented with a one-week duration shortness of breath.  1. Acute on chronic diastolic congestive heart failure: Continue iv lasix, cardiology consult, h/o copd placed on nebs as well,slow to improve 2. Hypertensive urgency: Continue losartan,Hydralazine when necessary as needed for systolic greater than 180 or diastolic greater than 100 3. Hypothyroidism, unspecified: Continue with Synthroid. 4. Rheumatoid arthritis: Continue Plaquenil 5. Venous thromboembolism prophylactic: Heparin subcutaneous      Code Status Orders        Start     Ordered   08/10/14 2018  Full code   Continuous     08/10/14 2018      Family Communication: daugher in Social worker  Disposition Plan: Assiated living     Consults  cardiology   DVT Prophylaxis    Heparin -   Lab Results  Component Value Date   PLT 215 08/11/2014    Code status d/w patient , she dosent want rescussiation, or chest compression.   Time Spent in minutes     Auburn Bilberry M.D on 08/12/2014 at 12:21 PM  Between 7am to 6pm - Pager - [phone]  After 6pm go to [email or web address] - password [email or web address] [facility]  [facility]   Office  [phone]

## 2014-08-13 LAB — BASIC METABOLIC PANEL
ANION GAP: 9 (ref 5–15)
BUN: 41 mg/dL — ABNORMAL HIGH (ref 6–20)
CHLORIDE: 95 mmol/L — AB (ref 101–111)
CO2: 33 mmol/L — ABNORMAL HIGH (ref 22–32)
Calcium: 8.7 mg/dL — ABNORMAL LOW (ref 8.9–10.3)
Creatinine, Ser: 1.2 mg/dL — ABNORMAL HIGH (ref 0.44–1.00)
GFR calc Af Amer: 44 mL/min — ABNORMAL LOW (ref >60)
GFR, EST NON AFRICAN AMERICAN: 38 mL/min — AB (ref >60)
GLUCOSE: 122 mg/dL — AB (ref 65–99)
Potassium: 5.1 mmol/L (ref 3.5–5.1)
Sodium: 137 mmol/L (ref 135–145)

## 2014-08-13 MED ORDER — FUROSEMIDE 40 MG PO TABS
40.0000 mg | ORAL_TABLET | Freq: Every day | ORAL | Status: DC
  Administered 2014-08-13 – 2014-08-14 (×2): 40 mg via ORAL
  Filled 2014-08-13 (×3): qty 1

## 2014-08-13 NOTE — Progress Notes (Signed)
[facility] - [place] at [facility]                                                                                                                                                                                            Patient Demographics   Karen Bryan, is a 81 y.o. female, DOB - Jun 28, 1919, [id number]  Admit date - 08/10/2014   Admitting Physician Wyatt Haste, MD  Outpatient Primary MD for the patient is Leanna Sato, MD  LOS -   Chief Complaint  Patient presents with  . Shortness of Breath      Breathing about the same now very weak, cxr unchanged  Review of Systems:   CONSTITUTIONAL: No documented fever. No fatigue, weakness. No weight gain, no weight loss.  EYES: No blurry or double vision.  ENT: No tinnitus. No postnasal drip. No redness of the oropharynx.  RESPIRATORY: No cough, no wheeze, no hemoptysis. No dyspnea. + Sob CARDIOVASCULAR: No chest pain. +orthopnea. No palpitations. No syncope.  GASTROINTESTINAL: No nausea, no vomiting or diarrhea. No abdominal pain. No melena or hematochezia.  GENITOURINARY: No dysuria or hematuria.  ENDOCRINE: No polyuria or nocturia. No heat or cold intolerance.  HEMATOLOGY: No anemia. No bruising. No bleeding.  INTEGUMENTARY: No rashes. No lesions.  MUSCULOSKELETAL: No arthritis. No swelling. No gout.  NEUROLOGIC: No numbness, tingling, or ataxia. No seizure-type activity.  PSYCHIATRIC: No anxiety. No insomnia. No ADD.    Vitals:   Filed Vitals:   08/13/14 1120 08/13/14 1150 08/13/14 1153 08/13/14 1156  BP:      Pulse:      Temp:      TempSrc:      Resp:      Height:      Weight:      SpO2: 96% 88% 84% 96%    Wt Readings from Last 3 Encounters:  08/13/14 57.335 kg (126 lb 6.4 oz)     Intake/Output Summary (Last 24 hours) at 08/13/14 1451 Last data filed at 08/13/14 1320  Gross per 24 hour  Intake    483 ml  Output   1750 ml  Net  -1267 ml    Physical Exam:   GENERAL:  Pleasant-appearing in no apparent distress.  HEAD, EYES, EARS, NOSE AND THROAT: Atraumatic, normocephalic. Extraocular muscles are intact. Pupils equal and reactive to light. Sclerae anicteric. No conjunctival injection. No oro-pharyngeal erythema.  NECK: Supple. There is no jugular venous distention. No bruits, no lymphadenopathy, no thyromegaly.  HEART: Regular rate and rhythm, tachycardic. No murmurs, no rubs, no clicks.  LUNGS: Bilateral crackles without  assesory muscle usage  ABDOMEN:  Soft, flat, nontender, nondistended. Has good bowel sounds. No hepatosplenomegaly appreciated.  EXTREMITIES: No evidence of any cyanosis, clubbing, or peripheral edema.  +2 pedal and radial pulses bilaterally.  NEUROLOGIC: The patient is alert, awake, and oriented x3 with no focal motor or sensory deficits appreciated bilaterally.  SKIN: Moist and warm with no rashes appreciated.     Antibiotics   Anti-infectives    Start     Dose/Rate Route Frequency Ordered Stop   08/11/14 1000  hydroxychloroquine (PLAQUENIL) tablet 200 mg     200 mg Oral Daily 08/10/14 2237        Medications   Scheduled Meds: . furosemide  40 mg Oral Daily  . heparin  5,000 Units Subcutaneous 3 times per day  . hydroxychloroquine  200 mg Oral Daily  . ipratropium-albuterol  3 mL Nebulization QID  . levothyroxine  50 mcg Oral QAC breakfast  . losartan  50 mg Oral Daily  . magnesium sulfate  1 application Topical Daily  . PARoxetine  10 mg Oral Daily  . sodium chloride  3 mL Intravenous Q12H   Continuous Infusions:  PRN Meds:.acetaminophen **OR** acetaminophen, hydrALAZINE, hydroxypropyl methylcellulose / hypromellose, morphine injection   Data Review:   Micro Results No results found for this or any previous visit (from the past 240 hour(s)).  Radiology Reports Dg Chest 2 View  08/12/2014   CLINICAL DATA:  Cardiac silhouette is borderline enlarged. The aorta is uncoiled. No mediastinal or hilar masses or evidence of  adenopathy.  EXAM: CHEST  2 VIEW  COMPARISON:  None.  FINDINGS: The heart size and mediastinal contours are within normal limits. Both lungs are clear. The visualized skeletal structures are unremarkable.  Lungs are mildly hyperexpanded. There is diffuse bilateral interstitial thickening similar to the prior exam. There are no areas of lung consolidation. No pleural effusion or pneumothorax.  Bony thorax is demineralized. There is a treated compression fracture at the thoracolumbar junction, stable.  IMPRESSION: 1. No change from the prior exam. 2. Bilateral interstitial thickening. This may all be chronic. Interstitial edema as a component should be considered given the history of shortness of breath. No evidence of lobar pneumonia.   Electronically Signed   By: Amie Portland M.D.   On: 08/12/2014 10:40   Dg Chest 2 View  08/10/2014   CLINICAL DATA:  Shortness of breath, cough, has felt poorly for a week, history pneumonia, COPD, CHF, hypertension  EXAM: CHEST  2 VIEW  COMPARISON:  03/27/2014  FINDINGS: Borderline enlargement of cardiac silhouette.  Tortuosity of thoracic aorta with atherosclerotic calcification.  Slight pulmonary vascular congestion.  Peribronchial thickening with diffuse interstitial prominence little changed from previous exam, question minimal edema or less likely infiltrate.  Mild atelectasis at RIGHT base.  Remaining lungs clear.  No pleural effusion or pneumothorax.  Bones demineralized.  Prior vertebroplasty at thoracolumbar junction.  IMPRESSION: Enlargement of cardiac silhouette with pulmonary vascular congestion and diffuse interstitial prominence, raising question of minimal pulmonary edema.   Electronically Signed   By: Ulyses Southward M.D.   On: 08/10/2014 17:38     CBC  Recent Labs Lab 08/10/14 1645 08/11/14 0227  WBC 10.4 5.7  HGB 12.1 10.3*  HCT 37.1 32.3*  PLT 198 215  MCV 92.0 91.9  MCH 29.9 29.4  MCHC 32.6 32.0  RDW 15.6* 14.9*    Chemistries   Recent  Labs Lab 08/10/14 1645 08/11/14 0227 08/12/14 0437 08/13/14 0403  NA 139 139 139 137  K 4.8 4.1 4.7  5.1  CL 99* 100* 98* 95*  CO2 29 31 33* 33*  GLUCOSE 129* 115* 128* 122*  BUN 29* 30* 36* 41*  CREATININE 0.96 0.98 1.05* 1.20*  CALCIUM 9.2 8.5* 8.6* 8.7*  AST 21  --   --   --   ALT 11*  --   --   --   ALKPHOS 83  --   --   --   BILITOT 0.3  --   --   --    ------------------------------------------------------------------------------------------------------------------ estimated creatinine clearance is 23.7 mL/min (by C-G formula based on Cr of 1.2). ------------------------------------------------------------------------------------------------------------------ No results for input(s): HGBA1C in the last 72 hours. ------------------------------------------------------------------------------------------------------------------ No results for input(s): CHOL, HDL, LDLCALC, TRIG, CHOLHDL, LDLDIRECT in the last 72 hours. ------------------------------------------------------------------------------------------------------------------ No results for input(s): TSH, T4TOTAL, T3FREE, THYROIDAB in the last 72 hours.  Invalid input(s): FREET3 ------------------------------------------------------------------------------------------------------------------ No results for input(s): VITAMINB12, FOLATE, FERRITIN, TIBC, IRON, RETICCTPCT in the last 72 hours.  Coagulation profile No results for input(s): INR, PROTIME in the last 168 hours.  No results for input(s): DDIMER in the last 72 hours.  Cardiac Enzymes  Recent Labs Lab 08/10/14 1645 08/11/14 0225 08/11/14 0831  CKMB 1.8  --   --   TROPONINI 0.03 0.03 0.01   ------------------------------------------------------------------------------------------------------------------ Invalid input(s): POCBNP    Assessment & Plan   81 year old Caucasian female history of diastolic congestive heart as well as COPD oxygen requiring  presented with a one-week duration shortness of breath.  1. Acute on chronic diastolic congestive heart failure: bun increasing, on po lasix. Pt will need o2 at d/c, may have chronic intertial lung dz 2. Hypertensive urgency: Continue losartan,Hydralazine continue losartan 3. Hypothyroidism, unspecified: Continue with Synthroid. 4. Rheumatoid arthritis: Continue Plaquenil 5. Venous thromboembolism prophylactic: Heparin subcutaneous      Code Status Orders        Start     Ordered   08/10/14 2018  Full code   Continuous     08/10/14 2018        Disposition Plan:  Pt eval      Consults  cardiology   DVT Prophylaxis    Heparin -   Lab Results  Component Value Date   PLT 215 08/11/2014    Code status d/w patient , she dosent want rescussiation, or chest compression.   Time Spent in minutes     Auburn Bilberry M.D on 08/13/2014 at 2:51 PM  Between 7am to 6pm - Pager - [phone]  After 6pm go to [email or web address] - password [email or web address] [facility]  [facility]   Office  [phone]

## 2014-08-13 NOTE — Progress Notes (Addendum)
Patient: Karen Bryan / Admit Date: 08/10/2014 / Date of Encounter: 08/13/2014, 8:23 AM   Subjective: No chest pain. Very hard of hearing.  Less SOB. Lasix dose decreased to 40 IV BID yesterday  Review of Systems: Review of Systems  Constitutional: Positive for weight loss and malaise/fatigue. Negative for fever, chills and diaphoresis.  HENT: Positive for hearing loss.   Respiratory: Positive for cough and shortness of breath. Negative for hemoptysis, sputum production and wheezing.   Cardiovascular: Negative for chest pain, palpitations, orthopnea, claudication, leg swelling and PND.  Neurological: Positive for weakness.    Objective: Telemetry: NSR, 80s Physical Exam: Blood pressure 143/43, pulse 74, temperature 98 F (36.7 C), temperature source Oral, resp. rate 18, height 5\' 2"  (1.575 m), weight 57.335 kg (126 lb 6.4 oz), SpO2 94 %. Body mass index is 23.11 kg/(m^2). General: Well developed, well nourished, in no acute distress. Head: Normocephalic, atraumatic, sclera non-icteric, no xanthomas, nares are without discharge. HOH.  Neck: Negative for carotid bruits. JVP elevated. Lungs: Diffuse bilateral crackles. No wheezes or rhonchi. Breathing is unlabored. Heart: RRR S1 S2. 2/6 systolic murmurs at RUSB. No rub or gallop.  Abdomen: Soft, non-tender, non-distended with normoactive bowel sounds. No rebound/guarding. Extremities: No clubbing or cyanosis. No edema. Distal pedal pulses are 2+ and equal bilaterally. Neuro: Alert and oriented X 3. Moves all extremities spontaneously. Psych:  Responds to questions appropriately with a normal affect.   Intake/Output Summary (Last 24 hours) at 08/13/14 0823 Last data filed at 08/13/14 0444  Gross per 24 hour  Intake      3 ml  Output   1850 ml  Net  -1847 ml    Inpatient Medications:  . furosemide  40 mg Intravenous BID  . heparin  5,000 Units Subcutaneous 3 times per day  . hydroxychloroquine  200 mg Oral Daily  .  ipratropium-albuterol  3 mL Nebulization QID  . levothyroxine  50 mcg Oral QAC breakfast  . losartan  50 mg Oral Daily  . magnesium sulfate  1 application Topical Daily  . PARoxetine  10 mg Oral Daily  . potassium chloride  40 mEq Oral Daily  . sodium chloride  3 mL Intravenous Q12H   Infusions:    Labs:  Recent Labs  08/12/14 0437 08/13/14 0403  NA 139 137  K 4.7 5.1  CL 98* 95*  CO2 33* 33*  GLUCOSE 128* 122*  BUN 36* 41*  CREATININE 1.05* 1.20*  CALCIUM 8.6* 8.7*    Recent Labs  08/10/14 1645  AST 21  ALT 11*  ALKPHOS 83  BILITOT 0.3  PROT 7.6  ALBUMIN 3.8    Recent Labs  08/10/14 1645 08/11/14 0227  WBC 10.4 5.7  HGB 12.1 10.3*  HCT 37.1 32.3*  MCV 92.0 91.9  PLT 198 215    Recent Labs  08/10/14 1645 08/11/14 0225 08/11/14 0831  CKTOTAL 114  --   --   CKMB 1.8  --   --   TROPONINI 0.03 0.03 0.01   Invalid input(s): POCBNP No results for input(s): HGBA1C in the last 72 hours.   Weights: Filed Weights   08/11/14 0458 08/12/14 0532 08/13/14 0439  Weight: 60.464 kg (133 lb 4.8 oz) 60.238 kg (132 lb 12.8 oz) 57.335 kg (126 lb 6.4 oz)     Radiology/Studies:  Dg Chest 2 View  08/12/2014   CLINICAL DATA:  Cardiac silhouette is borderline enlarged. The aorta is uncoiled. No mediastinal or hilar masses or evidence of  adenopathy.  EXAM: CHEST  2 VIEW  COMPARISON:  None.   FINDINGS: The heart size and mediastinal contours are within normal limits. Both lungs are clear. The visualized skeletal structures are unremarkable.   Lungs are mildly hyperexpanded. There is diffuse bilateral interstitial thickening similar to the prior exam. There are no areas of lung consolidation. No pleural effusion or pneumothorax.  Bony thorax is demineralized. There is a treated compression fracture at the thoracolumbar junction, stable.    IMPRESSION: 1. No change from the prior exam. 2. Bilateral interstitial thickening. This may all be chronic. Interstitial edema as a  component should be considered given the history of shortness of breath. No evidence of lobar pneumonia.   Electronically Signed   By: Amie Portland M.D.   On: 08/12/2014 10:40      Assessment and Plan  81 y.o. female with history of chronic diastolic CHF, chronic respiratory failure on 2 L home oxygen but never smoked, recurrent PNA with possible aspiration, and RA presented with dyspnea and suspected acute on chronic diastolic CHF.   1. Acute on chronic diastolic CHF:  Lab work suggesting she is getting prerenal, rising BUN and creatinine --Would hold IV lasix for now, will restart outpt PO lasix dose, 40 mg daily -K+ elevated, will hold potassium today, talked with nursing  (unclear if she is on potassium at home,, not on H&P med list)   2. Chronic lung disease. Suspect pulmonary fibrosis/interstitial lung disease H/o PNA:  Bilateral interstitial thickening on CXR, chronic issue No evidence for PNA on CXR and WBCs not significantly elevated. -Continue nebs , supplemental oxygen  3.RA  Stable, but limiting her ability to ambulate well  4. PT consult,  mobilize.    Signed, Dossie Arbour, MD, Ph.D. 08/13/2014 8:23 AM

## 2014-08-13 NOTE — Progress Notes (Deleted)
Notified Dr. Patel that BNP is >19,000 and asked if IVF needed to be continued. Dr Patel gave verbal order to d/c fluids at this time.  

## 2014-08-13 NOTE — Evaluation (Signed)
Physical Therapy Evaluation Patient Details Name: Karen Bryan MRN: [id number] DOB: 1919-08-10 Today's Date: 08/13/2014   History of Present Illness  Pt is a 81yo white female with chronic history of heart failure adn RA. Pt experienced SOB/DOE x1wk prior to coming in to [facility], and found to have acute exacerbation of heart failure.   Clinical Impression  Patient presents with impairment of strength, pain, range of motion, and activity tolerance, limiting ability to perform ADL, IADL, and ambulation. Patient will benefit from skilled intervention to address the above impairments and limitations, in order to restore to prior level of function and to decrease caregiver burden.      Follow Up Recommendations SNF;Supervision - Intermittent;Supervision for mobility/OOB;Other (comment) (Can receive PT at current residence. )    Equipment Recommendations  Rolling Galea with 5" wheels    Recommendations for Other Services       Precautions / Restrictions Precautions Precautions: Fall Restrictions Weight Bearing Restrictions: No      Mobility  Bed Mobility Overal bed mobility: Needs Assistance     Sidelying to sit: Mod assist       General bed mobility comments: Requires modA for scooting forward in bed.   Transfers Overall transfer level: Needs assistance Equipment used: Rolling Dier (2 wheeled) Transfers: Sit to/from Stand Sit to Stand: Min guard            Ambulation/Gait Ambulation/Gait assistance: Min guard Ambulation Distance (Feet): 30 Feet Assistive device: Rolling Voeltz (2 wheeled) Gait Pattern/deviations: Antalgic   Gait velocity interpretation: <1.8 ft/sec, indicative of risk for recurrent falls    Stairs            Wheelchair Mobility    Modified Rankin (Stroke Patients Only)       Balance Overall balance assessment: History of Falls;No apparent balance deficits (not formally assessed) (reports 1 fall this year)                                           Pertinent Vitals/Pain      Home Living Family/patient expects to be discharged to:: Assisted living               Home Equipment: Heavner - 4 wheels      Prior Function Level of Independence: Needs assistance   Gait / Transfers Assistance Needed: Limited distance AMB with RW   ADL's / Homemaking Assistance Needed: Requires help with dressing, secondary to poor fine motor function bilat hands        Hand Dominance        Extremity/Trunk Assessment   Upper Extremity Assessment: RUE deficits/detail;LUE deficits/detail RUE Deficits / Details: decreased function due to chronic RA/ ulnar drift and flexion contractions     LUE Deficits / Details: decreased function due to chronic RA/ ulnar drift and flexion contractions   Lower Extremity Assessment: Generalized weakness (General RA pain in knees and hips with amb. )      Cervical / Trunk Assessment: Kyphotic  Communication   Communication: HOH  Cognition Arousal/Alertness: Awake/alert Behavior During Therapy: WFL for tasks assessed/performed Overall Cognitive Status: Within Functional Limits for tasks assessed                      General Comments      Exercises        Assessment/Plan    PT Assessment Patient needs continued  PT services  PT Diagnosis Difficulty walking;Generalized weakness;Abnormality of gait   PT Problem List Decreased strength;Impaired tone;Decreased range of motion;Decreased activity tolerance;Decreased balance  PT Treatment Interventions Gait training;Therapeutic activities;Therapeutic exercise   PT Goals (Current goals can be found in the Care Plan section) Acute Rehab PT Goals Patient Stated Goal: Return to ALF.  PT Goal Formulation: With patient Time For Goal Achievement: 08/27/14 Potential to Achieve Goals: Good    Frequency Min 2X/week   Barriers to discharge        Co-evaluation               End of Session   Activity  Tolerance: Patient limited by fatigue;Other (comment) (Pt desatting with exersion on RA. ) Patient left: in bed;with bed alarm set;with call bell/phone within reach           Time: 1540-1555 PT Time Calculation (min) (ACUTE ONLY): 15 min   Charges:   PT Evaluation $Initial PT Evaluation Tier I: 1 Procedure     PT G Codes:        Glora Hulgan C 2014/09/01, 4:08 PM  Rosamaria Lints, PT, DPT, BM

## 2014-08-14 LAB — CBC
HEMATOCRIT: 30.5 % — AB (ref 35.0–47.0)
HEMOGLOBIN: 9.9 g/dL — AB (ref 12.0–16.0)
MCH: 29.5 pg (ref 26.0–34.0)
MCHC: 32.5 g/dL (ref 32.0–36.0)
MCV: 90.9 fL (ref 80.0–100.0)
Platelets: 206 10*3/uL (ref 150–440)
RBC: 3.35 MIL/uL — AB (ref 3.80–5.20)
RDW: 14.9 % — AB (ref 11.5–14.5)
WBC: 5.3 10*3/uL (ref 3.6–11.0)

## 2014-08-14 LAB — BASIC METABOLIC PANEL
ANION GAP: 7 (ref 5–15)
BUN: 47 mg/dL — ABNORMAL HIGH (ref 6–20)
CALCIUM: 8.8 mg/dL — AB (ref 8.9–10.3)
CHLORIDE: 95 mmol/L — AB (ref 101–111)
CO2: 33 mmol/L — AB (ref 22–32)
Creatinine, Ser: 1.18 mg/dL — ABNORMAL HIGH (ref 0.44–1.00)
GFR calc Af Amer: 45 mL/min — ABNORMAL LOW (ref >60)
GFR calc non Af Amer: 39 mL/min — ABNORMAL LOW (ref >60)
GLUCOSE: 133 mg/dL — AB (ref 65–99)
POTASSIUM: 5.3 mmol/L — AB (ref 3.5–5.1)
SODIUM: 135 mmol/L (ref 135–145)

## 2014-08-14 LAB — MAGNESIUM: Magnesium: 2.3 mg/dL (ref 1.7–2.4)

## 2014-08-14 MED ORDER — LEVOFLOXACIN 250 MG PO TABS
250.0000 mg | ORAL_TABLET | Freq: Every day | ORAL | Status: AC
Start: 2014-08-14 — End: 2014-08-19

## 2014-08-14 NOTE — Discharge Summary (Signed)
Karen Bryan, 81 y.o., DOB 01/11/1920, MRN [id number]. Admission date: 08/10/2014 Discharge Date 08/14/2014 Primary MD Leanna Sato, MD Admitting Physician Wyatt Haste, MD  Admission Diagnosis  Congestive heart failure due to valvular disease, acute on chronic, systolic [I50.23, I38]  Discharge Diagnosis   Principal Problem:   Acute on chronic diastolic heart failure Active Problems:   Hypertensive urgency   Hypertension, essential   Rheumatoid arthritis   COPD (chronic obstructive pulmonary disease)    Past Medical History  Diagnosis Date  . COPD (chronic obstructive pulmonary disease)   . CHF (congestive heart failure)   . Hypertension     History reviewed. No pertinent past surgical history.    Hospital Course  Pt is 81 y.o WF with h/o diastolic chf presented with sob.  Pt was evaluated in ed noted to have hypoxia. She was treated with IV lasix and then subsequently changed to po lasix.  Her repeat cxr suggests she may have underlying chronic lung dz as well. She has diuresed well seen by cardiology during hospital. She will need to wear oxygen all the time. She will return to her assited living with pt. Code status d/w patient during hosptialization and made DNR.   Principal Problem:   Acute on chronic diastolic heart failure Active Problems:   Hypertensive urgency   Hypertension, essential   Rheumatoid arthritis   COPD (chronic obstructive pulmonary disease)    Consults  cardiology  Significant Tests:  See full reports for all details      Dg Chest 2 View  08/12/2014   CLINICAL DATA:  Cardiac silhouette is borderline enlarged. The aorta is uncoiled. No mediastinal or hilar masses or evidence of adenopathy.  EXAM: CHEST  2 VIEW  COMPARISON:  None.  FINDINGS: The heart size and mediastinal contours are within normal limits. Both lungs are clear. The visualized skeletal structures are unremarkable.  Lungs are mildly hyperexpanded. There is diffuse bilateral interstitial  thickening similar to the prior exam. There are no areas of lung consolidation. No pleural effusion or pneumothorax.  Bony thorax is demineralized. There is a treated compression fracture at the thoracolumbar junction, stable.  IMPRESSION: 1. No change from the prior exam. 2. Bilateral interstitial thickening. This may all be chronic. Interstitial edema as a component should be considered given the history of shortness of breath. No evidence of lobar pneumonia.   Electronically Signed   By: Amie Portland M.D.   On: 08/12/2014 10:40   Dg Chest 2 View  08/10/2014   CLINICAL DATA:  Shortness of breath, cough, has felt poorly for a week, history pneumonia, COPD, CHF, hypertension  EXAM: CHEST  2 VIEW  COMPARISON:  03/27/2014  FINDINGS: Borderline enlargement of cardiac silhouette.  Tortuosity of thoracic aorta with atherosclerotic calcification.  Slight pulmonary vascular congestion.  Peribronchial thickening with diffuse interstitial prominence little changed from previous exam, question minimal edema or less likely infiltrate.  Mild atelectasis at RIGHT base.  Remaining lungs clear.  No pleural effusion or pneumothorax.  Bones demineralized.  Prior vertebroplasty at thoracolumbar junction.  IMPRESSION: Enlargement of cardiac silhouette with pulmonary vascular congestion and diffuse interstitial prominence, raising question of minimal pulmonary edema.   Electronically Signed   By: Ulyses Southward M.D.   On: 08/10/2014 17:38       Today   Subjective:   Karen Bryan today has no headache,no chest abdominal pain,no new weakness tingling or numbness, feels much better. Her sob improved and however will need home o2.  Objective:   Blood pressure 140/42, pulse 68, temperature 98.3 F (36.8 C), temperature source Oral, resp. rate 25, height 5\' 2"  (1.575 m), weight 57.244 kg (126 lb 3.2 oz), SpO2 95 %.  .  Intake/Output Summary (Last 24 hours) at 08/14/14 1008 Last data filed at 08/14/14 0805  Gross per 24  hour  Intake    480 ml  Output   1200 ml  Net   -720 ml    Exam VITAL SIGNS: Blood pressure 140/42, pulse 68, temperature 98.3 F (36.8 C), temperature source Oral, resp. rate 25, height 5\' 2"  (1.575 m), weight 57.244 kg (126 lb 3.2 oz), SpO2 95 %.  GENERAL:  81 y.o.-year-old patient lying in the bed with no acute distress.  EYES: Pupils equal, round, reactive to light and accommodation. No scleral icterus. Extraocular muscles intact.  HEENT: Head atraumatic, normocephalic. Oropharynx and nasopharynx clear.  NECK:  Supple, no jugular venous distention. No thyroid enlargement, no tenderness.  LUNGS: Normal breath sounds bilaterally, no wheezing, rales,rhonchi or crepitation. No use of accessory muscles of respiration.  CARDIOVASCULAR: S1, S2 normal. No murmurs, rubs, or gallops.  ABDOMEN: Soft, nontender, nondistended. Bowel sounds present. No organomegaly or mass.  EXTREMITIES: No pedal edema, cyanosis, or clubbing.  NEUROLOGIC: Cranial nerves II through XII are intact. Muscle strength 5/5 in all extremities. Sensation intact. Gait not checked.  PSYCHIATRIC: The patient is alert and oriented x 3.  SKIN: No obvious rash, lesion, or ulcer.   Data Review   Cultures -   CBC w Diff: Lab Results  Component Value Date   WBC 5.3 08/14/2014   WBC 4.5 03/27/2014   HGB 9.9* 08/14/2014   HGB 10.1* 03/27/2014   HCT 30.5* 08/14/2014   HCT 31.4* 03/27/2014   PLT 206 08/14/2014   PLT 153 03/27/2014   LYMPHOPCT 13.0 03/27/2014   MONOPCT 13.0 03/27/2014   EOSPCT 6.0 03/27/2014   BASOPCT 0.4 03/27/2014   CMP: Lab Results  Component Value Date   NA 135 08/14/2014   NA 138 03/28/2014   K 5.3* 08/14/2014   K 3.7 03/28/2014   CL 95* 08/14/2014   CL 97* 03/28/2014   CO2 33* 08/14/2014   CO2 37* 03/28/2014   BUN 47* 08/14/2014   BUN 22* 03/28/2014   CREATININE 1.18* 08/14/2014   CREATININE 1.14 03/28/2014   PROT 7.6 08/10/2014   PROT 7.3 03/20/2014   ALBUMIN 3.8 08/10/2014   ALBUMIN  3.2* 03/20/2014   BILITOT 0.3 08/10/2014   ALKPHOS 83 08/10/2014   ALKPHOS 77 03/20/2014   AST 21 08/10/2014   AST 20 03/20/2014   ALT 11* 08/10/2014   ALT 18 03/20/2014  .  Micro Results No results found for this or any previous visit (from the past 240 hour(s)).   Discharge Instructions      Follow-up Information    Follow up with 10/10/2014, MD In 7 days.   Specialty:  Family Medicine   Contact information:   [address] Leanna Sato [place] Gerarda Gunther [place] [phone]       Follow up with [id number], MD In 7 days.   Specialty:  Cardiology   Contact information:   [address] [place] [address] [place] [phone]       Discharge Medications     Medication List    TAKE these medications        BENGAY VANISHING SCENT EX  Apply 1 application topically 4 (four) times daily.     ENSURE  Take 237 mLs  by mouth daily.     furosemide 40 MG tablet  Commonly known as:  LASIX  Take 40 mg by mouth daily.     hydroxychloroquine 200 MG tablet  Commonly known as:  PLAQUENIL  Take 200 mg by mouth daily.     hydroxypropyl methylcellulose / hypromellose 2.5 % ophthalmic solution  Commonly known as:  ISOPTO TEARS / GONIOVISC  Place 1 drop into both eyes daily as needed for dry eyes.     levofloxacin 250 MG tablet  Commonly known as:  LEVAQUIN  Take 1 tablet (250 mg total) by mouth daily.     levothyroxine 50 MCG tablet  Commonly known as:  SYNTHROID, LEVOTHROID  Take 50 mcg by mouth daily before breakfast.     losartan 50 MG tablet  Commonly known as:  COZAAR  Take 50 mg by mouth daily.     magnesium sulfate crystals  Commonly known as:  EPSOM SALTS  Apply 1 application topically daily. 1 cup as directed to soak foot every day     PARoxetine 10 MG tablet  Commonly known as:  PAXIL  Take 10 mg by mouth daily.         Total Time in preparing paper work, data evaluation and todays exam - 35 minutes  Auburn Bilberry M.D on 08/14/2014 at 10:08  AM  [facility]   Office  [phone]

## 2014-08-14 NOTE — Care Management (Signed)
Physical therapy is recommending a [place].  Confirmed with facility and patient's family that she has a rolling wealker

## 2014-08-14 NOTE — Clinical Social Work Note (Addendum)
CSW attempted to notified pt but she was sleeping heavily.  CSW did RN, and facility that pt would DC back to her ALF [facility] today.  RNCM confirmed that pt's family will transport and pt's daughter was also notified that pt would DC back to her ALF. CSW signing off.

## 2014-08-14 NOTE — Progress Notes (Signed)
Pt is alertx4. HOH. VSS. No complaints of pain. NS on tele. 1 assist to [facility]. Frequency due to lasix. Pt resting in bed. Will continue to monitor.

## 2014-08-14 NOTE — Discharge Instructions (Signed)
°  DIET:  Cardiac diet  DISCHARGE CONDITION:  Stable  ACTIVITY:  Activity as tolerated, pt eval and treat  OXYGEN:  Home Oxygen: Yes.     Oxygen Delivery: 2LO2 via Patient connected to nasal cannula oxygen  DISCHARGE LOCATION:  assited living    If you experience worsening of your admission symptoms, develop shortness of breath, life threatening emergency, suicidal or homicidal thoughts you must seek medical attention immediately by calling 911 or calling your MD immediately  if symptoms less severe.  You Must read complete instructions/literature along with all the possible adverse reactions/side effects for all the Medicines you take and that have been prescribed to you. Take any new Medicines after you have completely understood and accpet all the possible adverse reactions/side effects.   Please note  You were cared for by a hospitalist during your hospital stay. If you have any questions about your discharge medications or the care you received while you were in the hospital after you are discharged, you can call the unit and asked to speak with the hospitalist on call if the hospitalist that took care of you is not available. Once you are discharged, your primary care physician will handle any further medical issues. Please note that NO REFILLS for any discharge medications will be authorized once you are discharged, as it is imperative that you return to your primary care physician (or establish a relationship with a primary care physician if you do not have one) for your aftercare needs so that they can reassess your need for medications and monitor your lab values.

## 2014-08-14 NOTE — Progress Notes (Signed)
Taken off the floor via wheelchair. Personal o2 tank sent with patient. Packet given to daughter and instructed to give to staff at [facility]

## 2014-08-14 NOTE — Progress Notes (Signed)
Reported to [facility] assisted [facility], report given to RN. Patient discharge instructions along with status gone over with nurse. Iv and telemetry removed. Patient to be discharged on 02 at 2l. Family member to transport patient, packet will be given to family member and instructed to give patient and discharge instructions to staff at [facility]

## 2014-08-14 NOTE — Care Management (Signed)
Resting room air sat:  5/10 11:50 A: 88% Resting 02 sat:  2 liters 96% Room air exertion sats: 84% Exertional 02 sat"  N/A  Patient for discharge back to assisted living facility.  Spoke with patient's daughter Nelva Bush.  She will transport patient back to facility.  Per CSW, assisted living facility will accept patient.  Referral to [facility] for nursing, physical therapy and 02.

## 2014-08-17 ENCOUNTER — Emergency Department
Admission: EM | Admit: 2014-08-17 | Discharge: 2014-08-18 | Disposition: A | Discharge status: Home / Self Care | Payer: Medicare Other | Attending: Emergency Medicine | Admitting: Emergency Medicine

## 2014-08-17 ENCOUNTER — Emergency Department: Payer: Medicare Other

## 2014-08-17 DIAGNOSIS — I10 Essential (primary) hypertension: Secondary | ICD-10-CM | POA: Insufficient documentation

## 2014-08-17 DIAGNOSIS — R0789 Other chest pain: Secondary | ICD-10-CM | POA: Diagnosis present

## 2014-08-17 DIAGNOSIS — R079 Chest pain, unspecified: Secondary | ICD-10-CM

## 2014-08-17 HISTORY — DX: Essential (primary) hypertension: I10

## 2014-08-17 MED ORDER — ASPIRIN 81 MG PO CHEW
324.0000 mg | CHEWABLE_TABLET | Freq: Once | ORAL | Status: AC
  Administered 2014-08-18: 324 mg via ORAL

## 2014-08-17 MED ORDER — NITROGLYCERIN 0.4 MG SL SUBL
0.4000 mg | SUBLINGUAL_TABLET | SUBLINGUAL | Status: DC | PRN
  Administered 2014-08-18: 0.4 mg via SUBLINGUAL

## 2014-08-17 NOTE — ED Notes (Signed)
Pt from [facility] assisted [facility] with chest pain that began this pm. Pt states felt shob with pain. Pt relates pain is in center of chest. Pt denies dizziness, denies nausea. Skin normal color warm and dry.

## 2014-08-18 ENCOUNTER — Encounter: Payer: Self-pay | Admitting: Emergency Medicine

## 2014-08-18 ENCOUNTER — Emergency Department: Payer: Medicare Other

## 2014-08-18 ENCOUNTER — Emergency Department
Admission: EM | Admit: 2014-08-18 | Discharge: 2014-08-18 | Disposition: A | Discharge status: Home / Self Care | Payer: Medicare Other | Attending: Emergency Medicine | Admitting: Emergency Medicine

## 2014-08-18 DIAGNOSIS — R079 Chest pain, unspecified: Secondary | ICD-10-CM | POA: Diagnosis present

## 2014-08-18 DIAGNOSIS — Z87891 Personal history of nicotine dependence: Secondary | ICD-10-CM | POA: Insufficient documentation

## 2014-08-18 DIAGNOSIS — I10 Essential (primary) hypertension: Secondary | ICD-10-CM | POA: Insufficient documentation

## 2014-08-18 DIAGNOSIS — Z880 Allergy status to penicillin: Secondary | ICD-10-CM | POA: Diagnosis not present

## 2014-08-18 LAB — COMPREHENSIVE METABOLIC PANEL
ALT: 13 U/L — ABNORMAL LOW (ref 14–54)
AST: 16 U/L (ref 15–41)
Albumin: 3.2 g/dL — ABNORMAL LOW (ref 3.5–5.0)
Alkaline Phosphatase: 61 U/L (ref 38–126)
Anion gap: 7 (ref 5–15)
BILIRUBIN TOTAL: 0.4 mg/dL (ref 0.3–1.2)
BUN: 51 mg/dL — AB (ref 6–20)
CHLORIDE: 99 mmol/L — AB (ref 101–111)
CO2: 30 mmol/L (ref 22–32)
CREATININE: 1.34 mg/dL — AB (ref 0.44–1.00)
Calcium: 8.4 mg/dL — ABNORMAL LOW (ref 8.9–10.3)
GFR calc Af Amer: 39 mL/min — ABNORMAL LOW (ref >60)
GFR, EST NON AFRICAN AMERICAN: 33 mL/min — AB (ref >60)
Glucose, Bld: 122 mg/dL — ABNORMAL HIGH (ref 65–99)
Potassium: 4.5 mmol/L (ref 3.5–5.1)
SODIUM: 136 mmol/L (ref 135–145)
Total Protein: 6.3 g/dL — ABNORMAL LOW (ref 6.5–8.1)

## 2014-08-18 LAB — CBC WITH DIFFERENTIAL/PLATELET
BASOS ABS: 0 10*3/uL (ref 0–0.1)
BASOS PCT: 1 %
EOS PCT: 5 %
Eosinophils Absolute: 0.2 10*3/uL (ref 0–0.7)
HCT: 30.7 % — ABNORMAL LOW (ref 35.0–47.0)
Hemoglobin: 10.1 g/dL — ABNORMAL LOW (ref 12.0–16.0)
Lymphocytes Relative: 9 %
Lymphs Abs: 0.4 10*3/uL — ABNORMAL LOW (ref 1.0–3.6)
MCH: 29.8 pg (ref 26.0–34.0)
MCHC: 32.9 g/dL (ref 32.0–36.0)
MCV: 90.7 fL (ref 80.0–100.0)
Monocytes Absolute: 0.7 10*3/uL (ref 0.2–0.9)
Monocytes Relative: 14 %
NEUTROS ABS: 3.4 10*3/uL (ref 1.4–6.5)
Neutrophils Relative %: 71 %
Platelets: 223 10*3/uL (ref 150–440)
RBC: 3.39 MIL/uL — ABNORMAL LOW (ref 3.80–5.20)
RDW: 14.9 % — AB (ref 11.5–14.5)
WBC: 4.7 10*3/uL (ref 3.6–11.0)

## 2014-08-18 LAB — TROPONIN I: Troponin I: 0.03 ng/mL (ref <0.031)

## 2014-08-18 LAB — BRAIN NATRIURETIC PEPTIDE: B NATRIURETIC PEPTIDE 5: 252 pg/mL — AB (ref 0.0–100.0)

## 2014-08-18 MED ORDER — NITROGLYCERIN 0.4 MG SL SUBL
SUBLINGUAL_TABLET | SUBLINGUAL | Status: AC
  Filled 2014-08-18: qty 1

## 2014-08-18 MED ORDER — ASPIRIN 81 MG PO CHEW
CHEWABLE_TABLET | ORAL | Status: AC
  Filled 2014-08-18: qty 4

## 2014-08-18 MED ORDER — FUROSEMIDE 10 MG/ML IJ SOLN
40.0000 mg | Freq: Once | INTRAMUSCULAR | Status: AC
  Administered 2014-08-18: 40 mg via INTRAVENOUS

## 2014-08-18 MED ORDER — FUROSEMIDE 20 MG PO TABS: 20.0000 mg | ORAL_TABLET | ORAL | Status: DC

## 2014-08-18 MED ORDER — ASPIRIN 325 MG PO TABS
325.0000 mg | ORAL_TABLET | Freq: Every day | ORAL | Status: DC
  Filled 2014-08-18: qty 1

## 2014-08-18 MED ORDER — NITROGLYCERIN 0.4 MG SL SUBL
0.4000 mg | SUBLINGUAL_TABLET | SUBLINGUAL | Status: DC | PRN
  Administered 2014-08-18: 0.4 mg via SUBLINGUAL

## 2014-08-18 MED ORDER — FUROSEMIDE 10 MG/ML IJ SOLN
INTRAMUSCULAR | Status: AC
  Filled 2014-08-18: qty 4

## 2014-08-18 MED ORDER — ASPIRIN 325 MG PO TABS
325.0000 mg | ORAL_TABLET | Freq: Every day | ORAL | Status: DC
  Administered 2014-08-18: 324 mg via ORAL
  Filled 2014-08-18: qty 1

## 2014-08-18 NOTE — ED Notes (Signed)
Pt sleeping, resps unlabored.  

## 2014-08-18 NOTE — ED Notes (Signed)
Pt assisted with bedpan. Pt denies pain at this time.

## 2014-08-18 NOTE — ED Notes (Signed)
Spoke with patient's son [phone]). Son stated that [facility] was coming to transport patient within the hour. Attempted to contact [facility] with no answer.

## 2014-08-18 NOTE — ED Notes (Signed)
Unable to perform successful venipuncture x3. Lab called for assistance. Spoke with robin.

## 2014-08-18 NOTE — ED Notes (Signed)
Pt is confused, alert to birthday, answers to Karen Bryan, calls self Karen and also answers to Karen Bryan, but has an insurance card for Karen Bryan in her pocket.Marland Kitchen [facility] called for confirmation regarding which patient is here. Received report from ems on a Karen Bryan. [facility] states "we think you should have Karen Bryan there". This rn received paperwork from [facility] on Karen Bryan. [facility] notified of discrepency in paperwork and pt name.

## 2014-08-18 NOTE — Discharge Instructions (Signed)

## 2014-08-18 NOTE — ED Provider Notes (Signed)
[facility] Emergency Department Provider Note  ____________________________________________  Time seen: Approximately 2337 PM  I have reviewed the triage vital signs and the nursing notes.   HISTORY  Chief Complaint Chest Pain    HPI Karen Bryan is a 81 y.o. female who comes in todaywith chest tightness shortness of breath. The patient reports that she was told she has fluid in her lungs. The patient has had some pain and pressure in her chest all day. She has not had any nausea or dizziness but has had shortness of breath associated with it. The patient reports that she has not taken anything for the pain and reports that the pain is a 10 out of 10 in intensity. She reports that it feels currently like it is tight. She reports that she does also have some low back pain and has taken her medications today. She does have a history of arthritis. She has had some swelling in her legs. She has not had any vomiting. The patient was recently discharged from the hospital.   Past Medical History  Diagnosis Date  . COPD (chronic obstructive pulmonary disease)   . CHF (congestive heart failure)   . Hypertension     There are no active problems to display for this patient.  No past surgical history on file.  Hip replacement  No current outpatient prescriptions on file.   Allergies PCN  No family history on file.  Social History History  Substance Use Topics  . Smoking status: Former Games developer  . Smokeless tobacco: Not on file  . Alcohol Use: No    Review of Systems Constitutional: No fever/chills Eyes: Blurred vision ENT: No sore throat. Cardiovascular:  chest pain. Respiratory:  shortness of breath. Gastrointestinal: No abdominal pain.  No nausea, no vomiting.   Genitourinary: Negative for dysuria. Musculoskeletal: back pain. Skin: Negative for rash. Neurological: Negative for headaches, focal weakness or  numbness. Hematological/Lymphatic:Bilateral lower extremity swelling  10-point ROS otherwise negative.  ____________________________________________   PHYSICAL EXAM:  VITAL SIGNS: ED Triage Vitals  Enc Vitals Group     BP 08/17/14 2336 194/65 mmHg     Pulse Rate 08/17/14 2336 74     Resp 08/17/14 2336 20     Temp 08/17/14 2336 98.3 F (36.8 C)     Temp Source 08/17/14 2336 Oral     SpO2 08/17/14 2336 100 %     Weight 08/17/14 2336 166 lb 5 oz (75.439 kg)     Height 08/17/14 2336 4\' 11"  (1.499 m)     Head Cir --      Peak Flow --      Pain Score 08/17/14 2337 6     Pain Loc --      Pain Edu? --      Excl. in GC? --     Constitutional: Alert and oriented. Well appearing and in no acute distress. Eyes: Conjunctivae are normal. PERRL. EOMI. Head: Atraumatic. Nose: No congestion/rhinnorhea. Mouth/Throat: Mucous membranes are moist.  Oropharynx non-erythematous. Cardiovascular: Normal rate, regular rhythm. Grade 2/6 systolic murmur Respiratory: Normal respiratory effort.  Crackles throughout all lung fields Gastrointestinal: Soft and nontender. No distention. No abdominal bruits. No CVA tenderness. Genitourinary: Deferred Musculoskeletal: Bilateral lower extremity edema  Neurologic:  Normal speech and language. No gross focal neurologic deficits are appreciated. Speech is normal.  Skin:  Skin is warm, dry and intact. No rash noted. Psychiatric: Mood and affect are normal. Speech and behavior are normal.  ____________________________________________   LABS (  all labs ordered are listed, but only abnormal results are displayed)  Hemoglobin 10.1, hematocrit 30.7 Creatinine 1.34 ____________________________________________  EKG  ED ECG REPORT   Date: 08/17/2014  EKG Time: 2331  Rate: 76  Rhythm: normal EKG, normal sinus rhythm  Axis: Normal  Intervals:none  ST&T Change: None  ____________________________________________  RADIOLOGY  Chest x-ray: Persistent  cardiac enlargement and vascular congestion with nonspecific interstitial changes ____________________________________________   PROCEDURES  Procedure(s) performed: None  Critical Care performed: No  ____________________________________________   INITIAL IMPRESSION / ASSESSMENT AND PLAN / ED COURSE  Pertinent labs & imaging results that were available during my care of the patient were reviewed by me and considered in my medical decision making (see chart for details).  This is a 81 year old female who comes in today with some chest pain and shortness of breath that she's been having all day. The patient does have a history of congestive heart failure. We will check the patient's blood work as well as a chest x-ray. The patient received nitroglycerin sublingually as she does have a significantly elevated blood pressure and she will receive some aspirin. We'll reassess the patient once received the results of the blood work and chest x-ray.  According to the chest x-ray the patient does have some chronic fibrosis or edema on her chest x-ray. The patient's pain did improve after one nitroglycerin. The patient has had no hypoxia and no difficulty with breathing or no other symptoms while in the emergency department. Her BNP was unremarkable. I will discharge the patient home as 2 troponins are negative and have her follow-up with her primary care physician. ____________________________________________   FINAL CLINICAL IMPRESSION(S) / ED DIAGNOSES  Final diagnoses:  Chest pain        Rebecka Apley, MD 08/18/14 [phone]

## 2014-08-18 NOTE — ED Notes (Signed)
Per ems, pt from [facility] with chest pain. Pt very hard of hearing, rubbing center chest. Ems states pain began yesterday pm with associated shob.

## 2014-08-27 ENCOUNTER — Ambulatory Visit: Payer: Medicare Other | Admitting: Family

## 2014-09-20 ENCOUNTER — Encounter: Payer: Medicare Other | Admitting: Cardiovascular Disease

## 2014-09-26 ENCOUNTER — Encounter: Payer: Self-pay | Admitting: *Deleted

## 2014-09-26 ENCOUNTER — Encounter: Payer: Self-pay | Admitting: Cardiovascular Disease

## 2014-09-26 ENCOUNTER — Ambulatory Visit (INDEPENDENT_AMBULATORY_CARE_PROVIDER_SITE_OTHER): Payer: Medicare Other | Admitting: Cardiovascular Disease

## 2014-09-26 VITALS — BP 164/70 | HR 68 | Ht 63.0 in | Wt 126.0 lb

## 2014-09-26 DIAGNOSIS — J441 Chronic obstructive pulmonary disease with (acute) exacerbation: Secondary | ICD-10-CM | POA: Insufficient documentation

## 2014-09-26 DIAGNOSIS — I10 Essential (primary) hypertension: Secondary | ICD-10-CM

## 2014-09-26 DIAGNOSIS — H9193 Unspecified hearing loss, bilateral: Secondary | ICD-10-CM

## 2014-09-26 DIAGNOSIS — R0602 Shortness of breath: Secondary | ICD-10-CM

## 2014-09-26 DIAGNOSIS — I5033 Acute on chronic diastolic (congestive) heart failure: Secondary | ICD-10-CM | POA: Diagnosis not present

## 2014-09-26 DIAGNOSIS — H9190 Unspecified hearing loss, unspecified ear: Secondary | ICD-10-CM | POA: Insufficient documentation

## 2014-09-26 MED ORDER — LEVOFLOXACIN 500 MG PO TABS: 500.0000 mg | ORAL_TABLET | Freq: Every day | ORAL | Status: DC

## 2014-09-26 NOTE — Progress Notes (Signed)
Patient ID: Karen Bryan, female    DOB: 02/28/1920, 81 y.o.   MRN: [id number]  HPI Comments: 81 y.o. female with history of chronic diastolic CHF, chronic respiratory failure on 2 L home oxygen but never smoked, recurrent PNA with possible aspiration, and RA presenting for follow-up of her acute on chronic diastolic CHF She has a history of Chronic lung disease, Suspected pulmonary fibrosis/interstitial lung disease Bilateral interstitial thickening on CXR, chronic issue  In follow-up today, she is a poor historian, difficult to talk with his given her severe hearing impairment Caretaker provides notes from the nursing home detailing that she has a worsening cough, shortness of breath Notes indicate worsening symptoms over the past week or 2  She is taking Lasix 60 mg daily. Denies any lower extremity edema Does not walk very far, uses a wheelchair Currently lives at [facility]. Nursing notes are requesting a higher rate of oxygen given her shortness of breath and coughing  Past medical history In the hospital she had worsening renal function with aggressive diuresis, Lasix IV  EKG on today's visit shows normal sinus rhythm with rate 68 bpm, no significant ST or T-wave changes     Allergies  Allergen Reactions  . Tramadol Nausea Only    Current Outpatient Prescriptions on File Prior to Visit  Medication Sig Dispense Refill  . ENSURE (ENSURE) Take 237 mLs by mouth daily.    . furosemide (LASIX) 20 MG tablet Take 1 tablet (20 mg total) by mouth every other day. (Patient taking differently: Take 60 mg by mouth daily. ) 4 tablet 0  . furosemide (LASIX) 40 MG tablet Take 40 mg by mouth daily.    . hydroxychloroquine (PLAQUENIL) 200 MG tablet Take 200 mg by mouth daily.    . hydroxypropyl methylcellulose / hypromellose (ISOPTO TEARS / GONIOVISC) 2.5 % ophthalmic solution Place 1 drop into both eyes daily as needed for dry eyes.    Marland Kitchen levothyroxine (SYNTHROID,  LEVOTHROID) 50 MCG tablet Take 50 mcg by mouth daily before breakfast.    . losartan (COZAAR) 50 MG tablet Take 50 mg by mouth daily.    . magnesium sulfate (EPSOM SALTS) crystals Apply 1 application topically daily. 1 cup as directed to soak foot every day    . Menthol, Topical Analgesic, (BENGAY VANISHING SCENT EX) Apply 1 application topically 4 (four) times daily.    Marland Kitchen PARoxetine (PAXIL) 10 MG tablet Take 10 mg by mouth daily.     No current facility-administered medications on file prior to visit.    Past Medical History  Diagnosis Date  . COPD (chronic obstructive pulmonary disease)   . CHF (congestive heart failure)   . Hypertension     History reviewed. No pertinent past surgical history.  Social History  reports that she has never smoked. She does not have any smokeless tobacco history on file. She reports that she does not drink alcohol or use illicit drugs.  Family History Family history is unknown by patient.   Review of Systems  Unable to perform ROS  patient is very hard of hearing She does confirm significant cough, chest congestion   BP 164/70 mmHg  Pulse 68  Ht 5\' 3"  (1.6 m)  Wt 126 lb (57.153 kg)  BMI 22.33 kg/m2  LMP  (LMP Unknown)  Physical Exam  Constitutional: She is oriented to person, place, and time. She appears well-nourished.  HENT:  Head: Normocephalic.  Nose: Nose normal.  Mouth/Throat: Oropharynx is clear and moist.  Eyes: Conjunctivae are normal.  Neck: Normal range of motion. Neck supple. No JVD present.  Cardiovascular: Normal rate, regular rhythm, normal heart sounds and intact distal pulses.  Exam reveals no gallop and no friction rub.   No murmur heard. Pulmonary/Chest: Effort normal. No respiratory distress. She has decreased breath sounds. She has rales. She exhibits no tenderness.  Abdominal: Soft. Bowel sounds are normal. She exhibits no distension. There is no tenderness.  Musculoskeletal: Normal range of motion. She exhibits  no edema or tenderness.  Lymphadenopathy:    She has no cervical adenopathy.  Neurological: She is alert and oriented to person, place, and time. Coordination normal.  Skin: Skin is warm and dry. No rash noted. No erythema.  Psychiatric: Her behavior is normal.

## 2014-09-26 NOTE — Assessment & Plan Note (Signed)
Blood pressure is well controlled on today's visit. No changes made to the medications. 

## 2014-09-26 NOTE — Assessment & Plan Note (Signed)
I'm concerned about increasing cough and chest congestion. Symptoms concerning for bronchitis. She has had a history of pneumonia in the past. Recommended she start Levaquin 500 mg daily for 14 days. Suggested she follow-up with pulmonary given underlying lung disease

## 2014-09-26 NOTE — Assessment & Plan Note (Signed)
Recommended she take Lasix 60 mg twice a day for 2 days then back to 60 mg daily. Increasing shortness of breath and chest congestion, most likely bronchitis or early pneumonia.

## 2014-09-26 NOTE — Patient Instructions (Signed)
Medication Instructions:  Please increase O2 up to 3L Please start Levaquin 500 mg once daily for 14 days Please give Lasix 60 mg twice daily for 2 days, then back to 60 mg once daily  Labwork: None  Testing/Procedures: None  Follow-Up: 3 month w/ Dr. Mariah Milling We will refer your pulmonology for evaluation of your shortness of breath   Any Other Special Instructions Will Be Listed Below: Foot soak at 4 pm (not 9 pm)

## 2014-09-26 NOTE — Assessment & Plan Note (Signed)
Difficult historian

## 2014-09-30 ENCOUNTER — Other Ambulatory Visit: Payer: Self-pay

## 2014-09-30 ENCOUNTER — Emergency Department
Admission: EM | Admit: 2014-09-30 | Discharge: 2014-09-30 | Disposition: A | Discharge status: Home / Self Care | Payer: Medicare Other | Attending: Emergency Medicine | Admitting: Emergency Medicine

## 2014-09-30 ENCOUNTER — Encounter: Payer: Self-pay | Admitting: *Deleted

## 2014-09-30 ENCOUNTER — Emergency Department: Payer: Medicare Other

## 2014-09-30 DIAGNOSIS — Z79899 Other long term (current) drug therapy: Secondary | ICD-10-CM | POA: Diagnosis not present

## 2014-09-30 DIAGNOSIS — I10 Essential (primary) hypertension: Secondary | ICD-10-CM | POA: Insufficient documentation

## 2014-09-30 DIAGNOSIS — R0600 Dyspnea, unspecified: Secondary | ICD-10-CM

## 2014-09-30 DIAGNOSIS — R0789 Other chest pain: Secondary | ICD-10-CM | POA: Diagnosis not present

## 2014-09-30 DIAGNOSIS — J441 Chronic obstructive pulmonary disease with (acute) exacerbation: Secondary | ICD-10-CM | POA: Insufficient documentation

## 2014-09-30 DIAGNOSIS — R079 Chest pain, unspecified: Secondary | ICD-10-CM

## 2014-09-30 DIAGNOSIS — Z792 Long term (current) use of antibiotics: Secondary | ICD-10-CM | POA: Diagnosis not present

## 2014-09-30 LAB — CBC WITH DIFFERENTIAL/PLATELET
BASOS ABS: 0 10*3/uL (ref 0–0.1)
Basophils Relative: 0 %
Eosinophils Absolute: 0.2 10*3/uL (ref 0–0.7)
Eosinophils Relative: 2 %
HCT: 36.5 % (ref 35.0–47.0)
HEMOGLOBIN: 11.9 g/dL — AB (ref 12.0–16.0)
Lymphocytes Relative: 7 %
Lymphs Abs: 0.4 10*3/uL — ABNORMAL LOW (ref 1.0–3.6)
MCH: 29.2 pg (ref 26.0–34.0)
MCHC: 32.5 g/dL (ref 32.0–36.0)
MCV: 90 fL (ref 80.0–100.0)
Monocytes Absolute: 0.7 10*3/uL (ref 0.2–0.9)
Monocytes Relative: 10 %
NEUTROS ABS: 5.4 10*3/uL (ref 1.4–6.5)
NEUTROS PCT: 81 %
PLATELETS: 203 10*3/uL (ref 150–440)
RBC: 4.06 MIL/uL (ref 3.80–5.20)
RDW: 15.3 % — AB (ref 11.5–14.5)
WBC: 6.7 10*3/uL (ref 3.6–11.0)

## 2014-09-30 LAB — BASIC METABOLIC PANEL
ANION GAP: 11 (ref 5–15)
BUN: 36 mg/dL — ABNORMAL HIGH (ref 6–20)
CO2: 33 mmol/L — AB (ref 22–32)
Calcium: 9 mg/dL (ref 8.9–10.3)
Chloride: 92 mmol/L — ABNORMAL LOW (ref 101–111)
Creatinine, Ser: 1.41 mg/dL — ABNORMAL HIGH (ref 0.44–1.00)
GFR calc Af Amer: 36 mL/min — ABNORMAL LOW (ref >60)
GFR, EST NON AFRICAN AMERICAN: 31 mL/min — AB (ref >60)
Glucose, Bld: 105 mg/dL — ABNORMAL HIGH (ref 65–99)
POTASSIUM: 4.1 mmol/L (ref 3.5–5.1)
Sodium: 136 mmol/L (ref 135–145)

## 2014-09-30 LAB — BRAIN NATRIURETIC PEPTIDE: B Natriuretic Peptide: 275 pg/mL — ABNORMAL HIGH (ref 0.0–100.0)

## 2014-09-30 LAB — TROPONIN I
TROPONIN I: 0.03 ng/mL (ref <0.031)
Troponin I: <0.03 ng/mL (ref <0.031)

## 2014-09-30 MED ORDER — ONDANSETRON HCL 4 MG/2ML IJ SOLN
4.0000 mg | Freq: Once | INTRAMUSCULAR | Status: AC
  Administered 2014-09-30: 4 mg via INTRAVENOUS

## 2014-09-30 MED ORDER — MORPHINE SULFATE 2 MG/ML IJ SOLN
INTRAMUSCULAR | Status: AC
  Administered 2014-09-30: 2 mg via INTRAVENOUS
  Filled 2014-09-30: qty 1

## 2014-09-30 MED ORDER — ASPIRIN 81 MG PO CHEW
CHEWABLE_TABLET | ORAL | Status: AC
  Administered 2014-09-30: 324 mg via ORAL
  Filled 2014-09-30: qty 4

## 2014-09-30 MED ORDER — MORPHINE SULFATE 2 MG/ML IJ SOLN
2.0000 mg | Freq: Once | INTRAMUSCULAR | Status: AC
Start: 2014-09-30 — End: 2014-09-30
  Administered 2014-09-30: 2 mg via INTRAVENOUS

## 2014-09-30 MED ORDER — ONDANSETRON HCL 4 MG/2ML IJ SOLN
INTRAMUSCULAR | Status: AC
  Administered 2014-09-30: 4 mg via INTRAVENOUS
  Filled 2014-09-30: qty 2

## 2014-09-30 MED ORDER — ASPIRIN 81 MG PO CHEW
324.0000 mg | CHEWABLE_TABLET | Freq: Once | ORAL | Status: AC
  Administered 2014-09-30: 324 mg via ORAL

## 2014-09-30 NOTE — ED Provider Notes (Signed)
[facility] Emergency Department Provider Note  ____________________________________________  Time seen: upon arrival to the emergency department  I have reviewed the triage vital signs and the nursing notes.   HISTORY  Chief Complaint Chest Pain    HPI Karen Bryan is a 81 y.o. female with a history of COPD and CHF recently diagnosed with bronchitis who presents today with chest pain and shortness of breath over the past 7 days. She says that the pain feels sharp and is in the central chest and does not radiate. She has been taking Levaquin at her SNF. Denies any nausea or vomiting. Also on Lasix 60 mg once daily. Patient has a DO NOT RESUSCITATE order which is on the chart with her. Currently on home O2 since being diagnosed with bronchitis.recently diagnosed on June 23 by her cardiologist, Dr. Tera Mater, with likely bronchitis. He also increased her Lasix for several days. The patient was prescribed Levaquin for 2 weeks.   Past Medical History  Diagnosis Date  . COPD (chronic obstructive pulmonary disease)   . CHF (congestive heart failure)   . Hypertension     Patient Active Problem List   Diagnosis Date Noted  . COPD exacerbation 09/26/2014  . Hard of hearing 09/26/2014  . Hypertensive urgency 08/10/2014  . Acute on chronic diastolic heart failure 08/10/2014  . Hypertension, essential 08/10/2014  . Rheumatoid arthritis 08/10/2014  . COPD (chronic obstructive pulmonary disease) 08/10/2014    History reviewed. No pertinent past surgical history.  Current Outpatient Rx  Name  Route  Sig  Dispense  Refill  . hydroxychloroquine (PLAQUENIL) 200 MG tablet   Oral   Take 200 mg by mouth daily.         . hydroxypropyl methylcellulose / hypromellose (ISOPTO TEARS / GONIOVISC) 2.5 % ophthalmic solution   Both Eyes   Place 1 drop into both eyes daily as needed for dry eyes.         Marland Kitchen levofloxacin (LEVAQUIN) 500 MG tablet   Oral   Take 1 tablet  (500 mg total) by mouth daily. Patient taking differently: Take 500 mg by mouth daily. For 14 days   14 tablet   0   . levothyroxine (SYNTHROID, LEVOTHROID) 50 MCG tablet   Oral   Take 50 mcg by mouth daily before breakfast.         . losartan (COZAAR) 50 MG tablet   Oral   Take 50 mg by mouth daily.         . Menthol, Topical Analgesic, (BENGAY VANISHING SCENT EX)   Apply externally   Apply 1 application topically 4 (four) times daily.         Marland Kitchen PARoxetine (PAXIL) 10 MG tablet   Oral   Take 10 mg by mouth daily.           Allergies Tramadol  Family History  Problem Relation Age of Onset  . Family history unknown: Yes    Social History History  Substance Use Topics  . Smoking status: Never Smoker   . Smokeless tobacco: Not on file  . Alcohol Use: No    Review of Systems Constitutional: No fever/chills Eyes: No visual changes. ENT: No sore throat. Cardiovascular: as above Respiratory: as above Gastrointestinal: No abdominal pain.  No nausea, no vomiting.  No diarrhea.  No constipation. Genitourinary: Negative for dysuria. Musculoskeletal: Negative for back pain. Skin: Negative for rash. Neurological: Negative for headaches, focal weakness or numbness.  10-point ROS otherwise negative.  ____________________________________________  PHYSICAL EXAM:  VITAL SIGNS: ED Triage Vitals  Enc Vitals Group     BP 09/30/14 1334 130/115 mmHg     Pulse Rate 09/30/14 1334 69     Resp --      Temp 09/30/14 1334 98 F (36.7 C)     Temp Source 09/30/14 1334 Oral     SpO2 09/30/14 1334 96 %     Weight 09/30/14 1334 128 lb (58.06 kg)     Height 09/30/14 1334 5\' 2"  (1.575 m)     Head Cir --      Peak Flow --      Pain Score --      Pain Loc --      Pain Edu? --      Excl. in GC? --     Constitutional: Alert and oriented. Well appearing and in no acute distress.patient with severe hearing loss. I have to yell into her right ear motor for her to hear me to  answer questions. Eyes: Conjunctivae are normal. PERRL. EOMI. Head: Atraumatic. Nose: No congestion/rhinnorhea. Mouth/Throat: Mucous membranes are moist.  Oropharynx non-erythematous. Neck: No stridor.   Cardiovascular: Normal rate, regular rhythm. Grossly normal heart sounds.  Good peripheral circulation. Respiratory: Normal respiratory effort.  No retractions. Rales to the mid and lower fields bilaterally. Speaks in full sentences. Gastrointestinal: Soft and nontender. No distention. No abdominal bruits. No CVA tenderness. Musculoskeletal: No lower extremity tenderness nor edema.  No joint effusions. Neurologic:  Normal speech and language. No gross focal neurologic deficits are appreciated. Speech is normal. No gait instability. Skin:  Skin is warm, dry and intact. No rash noted. Psychiatric: Mood and affect are normal. Speech and behavior are normal.  ____________________________________________   LABS (all labs ordered are listed, but only abnormal results are displayed)  Labs Reviewed  BRAIN NATRIURETIC PEPTIDE - Abnormal; Notable for the following:    B Natriuretic Peptide 275.0 (*)    All other components within normal limits  BASIC METABOLIC PANEL - Abnormal; Notable for the following:    Chloride 92 (*)    CO2 33 (*)    Glucose, Bld 105 (*)    BUN 36 (*)    Creatinine, Ser 1.41 (*)    GFR calc non Af Amer 31 (*)    GFR calc Af Amer 36 (*)    All other components within normal limits  CBC WITH DIFFERENTIAL/PLATELET - Abnormal; Notable for the following:    Hemoglobin 11.9 (*)    RDW 15.3 (*)    Lymphs Abs 0.4 (*)    All other components within normal limits  TROPONIN I  TROPONIN I   ____________________________________________  EKG  ED ECG REPORT I, , the attending physician, personally viewed and interpreted this ECG.   Date: 09/30/2014  EKG Time: 1327  Rate: 70  Rhythm: normal sinus rhythm, EKG machine read as accelerated junctional  rhythm. However, this is likely due to a poor baseline. There are pedal be T waves in lead V3. Should be noted though on her past EKG on 08/17/2014 there was also read a junctional rhythm.  Axis: normal axis  Intervals:none  ST&T Change: no ST elevations or depressions. No abnormal T-wave inversions. No change from 08/17/2014.  ____________________________________________  RADIOLOGY  Chronic interstitial lung markings which are decreased when compared to prior studies. I personally reviewed these images. ____________________________________________   PROCEDURES    ____________________________________________   INITIAL IMPRESSION / ASSESSMENT AND PLAN / ED COURSE  Pertinent  labs & imaging results that were available during my care of the patient were reviewed by me and considered in my medical decision making (see chart for details).  ----------------------------------------- 4:09 PM on 09/30/2014 -----------------------------------------  Discussed case with Dr. Mariah Milling who says at this point he believes that the patient's symptoms may be related to her worsening pulmonary disease. Also discussed with the patient's daughter-in-law, Nelva Bush, says the patient has a follow-up appointment with her primary care doctor tomorrow as well as with the pulmonary doctor this Thursday.  ----------------------------------------- 4:59 PM on 09/30/2014 -----------------------------------------  Patient resting comfortably in bed without any overt signs of pain or objective signs of shortness of breath. Patient has follow-up with multiple office visits this week. Also discussed possible steroid with Dr. Meredeth Ide who says it is no longer the recommended treatment for pulmonary fibrosis. We'll discharge back to her assisted living. To follow up with primary care doctor at appointment tomorrow. ____________________________________________   FINAL CLINICAL IMPRESSION(S) / ED DIAGNOSES  Acute chest  pain. Acute shortness of breath. Return visit.    Myrna Blazer, MD 09/30/14 [phone]

## 2014-09-30 NOTE — ED Notes (Signed)
Pt is from [facility], pt  Reports shortness of breath and chest pain for the last week, pt is heard of hearing

## 2014-09-30 NOTE — ED Notes (Signed)
Patient transported to X-ray 

## 2014-10-03 ENCOUNTER — Encounter: Payer: Self-pay | Admitting: Internal Medicine

## 2014-10-03 ENCOUNTER — Ambulatory Visit (INDEPENDENT_AMBULATORY_CARE_PROVIDER_SITE_OTHER): Payer: Medicare Other | Admitting: Internal Medicine

## 2014-10-03 ENCOUNTER — Ambulatory Visit: Payer: Medicare Other | Admitting: Internal Medicine

## 2014-10-03 VITALS — BP 108/70 | HR 71 | Temp 98.1°F | Ht 59.0 in | Wt 128.0 lb

## 2014-10-03 DIAGNOSIS — R059 Cough, unspecified: Secondary | ICD-10-CM | POA: Insufficient documentation

## 2014-10-03 DIAGNOSIS — R0600 Dyspnea, unspecified: Secondary | ICD-10-CM | POA: Diagnosis not present

## 2014-10-03 DIAGNOSIS — R05 Cough: Secondary | ICD-10-CM | POA: Diagnosis not present

## 2014-10-03 DIAGNOSIS — J449 Chronic obstructive pulmonary disease, unspecified: Secondary | ICD-10-CM

## 2014-10-03 DIAGNOSIS — J849 Interstitial pulmonary disease, unspecified: Secondary | ICD-10-CM

## 2014-10-03 NOTE — Assessment & Plan Note (Signed)
Multifactorial: diastolic congestive heart failure, advanced age, deconditioning, immobility, possible underlying interstitial lung disease.   At this time patient has multifactorial purposes for dyspnea, as stated above, supportive care with increased ambulation using a [place] will be beneficial for the patient.  Maintain oxygen saturations greater than 88% , currently on 3 L supplemental oxygen.  Plan:  - ambulation with [place] as tolerated - supplemental oxygen to maintain saturations greater than 88%

## 2014-10-03 NOTE — Assessment & Plan Note (Signed)
Multifactorial: possible underlying interstitial lung disease, deconditioning, postinfectious, pleural effusion from diastolic heart failure , medication.    Plan:  - improving, supportive care this time

## 2014-10-03 NOTE — Assessment & Plan Note (Signed)
Mild-to-moderate COPD,   keeping patient dry , and without any fluid overload will be paramount to her overall respiratory status.  I believe that the addition of inhalers at this time will not provide any major benefit and side effects our weigh any potential benefit.  Plan: -  Supplemental oxygen to maintain oxygen saturations greater 88%, currently on 3 L continuous. -  Ambulation with [place] as tolerated

## 2014-10-03 NOTE — Progress Notes (Signed)
Date: 10/03/2014  MRN# [id number] Karen Bryan 1919-10-30    Karen Bryan is a 81 y.o. old female seen in consultation for  Cough, shortness of breath, interstitial lung disease evaluation  CC:  Chief Complaint  Patient presents with  . Advice Only    DME- 3 L 02 cont-[facility] Pt c/o sob and chest tightness. She was sent to ER earlier this week and was sent back home. She has a dry cough.    HPI:   she is a pleasant 81 year old female seen in consultation today for cough, chest tightness, shortness of breath ; she is accompanied by a caretaker.  Patient lives in a nursing facility, [facility].   review of records showed that she was recently in the hospital for CHF exacerbation, pneumonia, she was treated with diuretics She recently saw her cardiologist for diastolic heart failure, noted to have increased cough and rails on physical exam at that visit, was started on Levaquin for 10 days which she completed. 2 days ago she was seen in the ED for complaints of shortness of breath and chest tightness, ED evaluation was completely negative. Patient is heart of hearing and has difficulty seeing also , however is very pleasant and cooperative during exam.  she has severe arthritis, currently on Plaquenil.  Caretaker states patient is able to ambulate with a [place] , which she does at times at the nursing facility  during the hospitalization in May, there were serial chest x-rays done, that showed possible chronic underlying interstitial changes, I have reviewed a CT chest from 2010  That showed bilateral pneumonias with mild effusion, difficult to appreciate interstitial changes at that time. Review of systems also difficult to obtain, from what I can gather from the patient she worked in a [employer] for a number of years. She is a never smoker. After her last hospitalization she was placed on 3 of supplemental oxygen for chronic hypoxia.   [facility] Hospitalization Review by Dr.  Dema Severin Admission date: 08/10/2014 Discharge Date 08/14/2014 Primary MD Leanna Sato, MD Admitting Physician Wyatt Haste, MD  Admission Diagnosis Congestive heart failure due to valvular disease, acute on chronic, systolic [I50.23, I38]  Discharge Diagnosis Principal Problem:  Acute on chronic diastolic heart failure Active Problems:  Hypertensive urgency  Hypertension, essential  Rheumatoid arthritis  COPD (chronic obstructive pulmonary disease)    Past Medical History  Diagnosis Date  . COPD (chronic obstructive pulmonary disease)   . CHF (congestive heart failure)   . Hypertension     History reviewed. No pertinent past surgical history.    Hospital Course Pt is 81 y.o WF with h/o diastolic chf presented with sob. Pt was evaluated in ed noted to have hypoxia. She was treated with IV lasix and then subsequently changed to po lasix. Her repeat cxr suggests she may have underlying chronic lung dz as well. She has diuresed well seen by cardiology during hospital. She will need to wear oxygen all the time. She will return to her assited living with pt. Code status d/w patient during hosptialization and made DNR.   Principal Problem:  Acute on chronic diastolic heart failure Active Problems:  Hypertensive urgency  Hypertension, essential  Rheumatoid arthritis  COPD (chronic obstructive pulmonary disease)        PMHX:   Past Medical History  Diagnosis Date  . COPD (chronic obstructive pulmonary disease)   . CHF (congestive heart failure)   . Hypertension    Surgical Hx:  No past  surgical history on file. Family Hx:  Family History  Problem Relation Age of Onset  . Family history unknown: Yes   Social Hx:   History  Substance Use Topics  . Smoking status: Never Smoker   . Smokeless tobacco: Not on file  . Alcohol Use: No   Medication:   Current Outpatient Rx  Name  Route  Sig  Dispense  Refill  . hydroxychloroquine (PLAQUENIL)  200 MG tablet   Oral   Take 200 mg by mouth daily.         . hydroxypropyl methylcellulose / hypromellose (ISOPTO TEARS / GONIOVISC) 2.5 % ophthalmic solution   Both Eyes   Place 1 drop into both eyes daily as needed for dry eyes.         Marland Kitchen levofloxacin (LEVAQUIN) 500 MG tablet   Oral   Take 1 tablet (500 mg total) by mouth daily. Patient taking differently: Take 500 mg by mouth daily. For 14 days   14 tablet   0   . levothyroxine (SYNTHROID, LEVOTHROID) 50 MCG tablet   Oral   Take 50 mcg by mouth daily before breakfast.         . losartan (COZAAR) 50 MG tablet   Oral   Take 50 mg by mouth daily.         . Menthol, Topical Analgesic, (BENGAY VANISHING SCENT EX)   Apply externally   Apply 1 application topically 4 (four) times daily.         Marland Kitchen PARoxetine (PAXIL) 10 MG tablet   Oral   Take 10 mg by mouth daily.             Allergies:  Tramadol  Review of Systems:  unable to completely obtain Patient with severe difficulty in hearing and  Vision.  Was able to admit to cough and mild shortness of breath  Physical Examination:   VS: BP 108/70 mmHg  Pulse 71  Temp(Src) 98.1 F (36.7 C) (Oral)  Ht 4\' 11"  (1.499 m)  Wt 128 lb (58.06 kg)  BMI 25.84 kg/m2  SpO2 98%  LMP  (LMP Unknown)  General Appearance: No distress  Neuro:without focal findings, mental status, speech normal, alert and oriented to person and place.  HEENT: PERRLA, EOM intact, no ptosis, no other lesions noticed; Mallampati 2 Pulmonary: decreased breath sound at the bases, mild fine basilar crackles, no wheezes.  No sputum production.  CardiovascularNormal S1,S2.  No m/r/g.  Abdominal aorta pulsation normal.    Abdomen: Benign, Soft, non-tender, No masses, hepatosplenomegaly, No lymphadenopathy Renal:  No costovertebral tenderness  GU:  No performed at this time. Endoc: No evident thyromegaly, no signs of acromegaly or Cushing features Skin:   warm, no rashes, no ecchymosis   Extremities: arthritic hands with mildly contracted fingers, warm with normal capillary refill. Other findings:none   Rad results:  CXR 09/30/14 EXAM: CHEST 2 VIEW  COMPARISON: 08/17/2014  FINDINGS: Heart size is normal. The aorta is unfolded and calcified. There are chronic abnormal interstitial markings throughout. These are less marked than were seen previously, and therefore, represent reduced interstitial edema. No evidence of worsening or new findings.  IMPRESSION: Chronic interstitial lung markings, diminished when compared to previous studies, therefore at least partially representing diminished interstitial edema.     CT CHEST 03/2009 RESULT: CT of the chest is performed utilizing 75 mL of Isovue-370 iodinated intravenous contrast. Images are reconstructed at 5 mm slice thickness in the axial plane. There is no previous examination for comparison.  Images demonstrate bilateral pleural effusions that are small to moderate. There are patchy areas of airspace disease bilaterally consistent with bilateral pneumonia. Asymmetric edema is felt to be less likely. The heart is enlarged slightly. The thoracic aorta is normal in caliber. There is no large central pulmonary embolism. A definite mass is not appreciated. There is a low-attenuation area in the lower pole of the right lobe of the thyroid which may represent colloid cysts. This measures 10 mm in size. Some borderline to mildly enlarged precarinal and AP window lymph nodes are present. There some calcification within subcarinal nodes consistent with granulomatous disease.  IMPRESSION: Bilateral pneumonia with pleural effusions, cardiomegaly and mediastinal adenopathy. Some calcification suggest granulomatous disease   Assessment and Plan:29  -year-old female seen in consultation for shortness of breath, chronic cough, interstitial lung disease evaluation. Dyspnea  Multifactorial: diastolic congestive heart  failure, advanced age, deconditioning, immobility, possible underlying interstitial lung disease.   At this time patient has multifactorial purposes for dyspnea, as stated above, supportive care with increased ambulation using a [place] will be beneficial for the patient.  Maintain oxygen saturations greater than 88% , currently on 3 L supplemental oxygen.  Plan:  - ambulation with [place] as tolerated - supplemental oxygen to maintain saturations greater than 88%  Cough  Multifactorial: possible underlying interstitial lung disease, deconditioning, postinfectious, pleural effusion from diastolic heart failure , medication.    Plan:  - improving, supportive care this time  COPD (chronic obstructive pulmonary disease)  Mild-to-moderate COPD,   keeping patient dry , and without any fluid overload will be paramount to her overall respiratory status.  I believe that the addition of inhalers at this time will not provide any major benefit and side effects our weigh any potential benefit.  Plan: -  Supplemental oxygen to maintain oxygen saturations greater 88%, currently on 3 L continuous. -  Ambulation with [place] as tolerated  ILD (interstitial lung disease)  Chest x-rays are concerning for possible chronic interstitial lung disease. Review of CT scan from 2010 showed that there was bilateral pneumonia with pleural effusion, making diagnosis of chronic interstitial lung disease difficult.  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  medication can also cause interstitial lung disease.  At this time , however , I believe that supportive care  Along with optimizing  Her diastolic heart failure, COPD, rheumatoid arthritis will be beneficial for the patient.  Plan:  - high-resolution CT scan of chest without contrast,  For chronic cough,  shortness of breath, interstitial changes noted on chest x-ray. - Continue supportive care. -  Supplemental oxygen to maintain saturations greater than 88% , currently on 3 L    Updated Medication List Outpatient Encounter Prescriptions as of 10/03/2014  Medication Sig  . hydroxychloroquine (PLAQUENIL) 200 MG tablet Take 200 mg by mouth daily.  . hydroxypropyl methylcellulose / hypromellose (ISOPTO TEARS / GONIOVISC) 2.5 % ophthalmic solution Place 1 drop into both eyes daily as needed for dry eyes.  Marland Kitchen levofloxacin (LEVAQUIN) 500 MG tablet Take 1 tablet (500 mg total) by mouth daily. (Patient taking differently: Take 500 mg by mouth daily. For 14 days)  . levothyroxine (SYNTHROID, LEVOTHROID) 50 MCG tablet Take 50 mcg by mouth daily before breakfast.  . losartan (COZAAR) 50 MG tablet Take 50 mg by mouth daily.  . Menthol, Topical Analgesic, (  BENGAY VANISHING SCENT EX) Apply 1 application topically 4 (four) times daily.  Marland Kitchen PARoxetine (PAXIL) 10 MG tablet Take 10 mg by mouth daily.   No facility-administered encounter medications on file as of 10/03/2014.    Orders for this visit: Orders Placed This Encounter  Procedures  . CT Chest High Resolution    Standing Status: Future     Number of Occurrences:      Standing Expiration Date: 12/03/2015    Scheduling Instructions:     Without contrast Dr. Llana Aliment or Fredirick Lathe to read     Please schedule close to 1 month f/u    Order Specific Question:  Reason for Exam (SYMPTOM  OR DIAGNOSIS REQUIRED)    Answer:  cough    Order Specific Question:  Preferred imaging location?    Answer:  [facility]     Thank  you for the consultation and for allowing [facility] to assist in the care of your patient. Our recommendations are noted above.  Please contact us if we can be of further service.   Stephanie Acre, MD [facility] Office Number: [phone]

## 2014-10-03 NOTE — Patient Instructions (Addendum)
Follow up with Dr. Dema Severin in 1 month - cont with supplemental oxygen 3L continuously to maintain saturation >88% - patient with high suspicion for interstitial lung disease, in any event the treatment is supportive: maintain O2 sat>88%, reduce risk for respiratory infections, avoid sick contacts.  - HRCT chest without contrast prior to follow up

## 2014-10-03 NOTE — Assessment & Plan Note (Signed)
Chest x-rays are concerning for possible chronic interstitial lung disease. Review of CT scan from 2010 showed that there was bilateral pneumonia with pleural effusion, making diagnosis of chronic interstitial lung disease difficult.  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  medication can also cause interstitial lung disease.  At this time , however , I believe that supportive care  Along with optimizing  Her diastolic heart failure, COPD, rheumatoid arthritis will be beneficial for the patient.  Plan:  - high-resolution CT scan of chest without contrast,  For chronic cough, shortness of breath, interstitial changes noted on chest x-ray. - Continue supportive care. -  Supplemental oxygen to maintain saturations greater than 88% , currently on 3 L

## 2014-10-25 ENCOUNTER — Ambulatory Visit
Admission: RE | Admit: 2014-10-25 | Discharge: 2014-10-25 | Disposition: A | Discharge status: Home / Self Care | Payer: Medicare Other | Source: Ambulatory Visit | Attending: Internal Medicine | Admitting: Internal Medicine

## 2014-10-25 DIAGNOSIS — R928 Other abnormal and inconclusive findings on diagnostic imaging of breast: Secondary | ICD-10-CM | POA: Insufficient documentation

## 2014-10-25 DIAGNOSIS — J849 Interstitial pulmonary disease, unspecified: Secondary | ICD-10-CM

## 2014-10-25 DIAGNOSIS — I2510 Atherosclerotic heart disease of native coronary artery without angina pectoris: Secondary | ICD-10-CM | POA: Diagnosis not present

## 2014-10-25 DIAGNOSIS — J449 Chronic obstructive pulmonary disease, unspecified: Secondary | ICD-10-CM

## 2014-10-25 DIAGNOSIS — J479 Bronchiectasis, uncomplicated: Secondary | ICD-10-CM | POA: Insufficient documentation

## 2014-10-25 DIAGNOSIS — R059 Cough, unspecified: Secondary | ICD-10-CM

## 2014-10-25 DIAGNOSIS — R05 Cough: Secondary | ICD-10-CM

## 2014-10-25 DIAGNOSIS — R0600 Dyspnea, unspecified: Secondary | ICD-10-CM

## 2014-10-25 DIAGNOSIS — I700 Atherosclerosis of aorta: Secondary | ICD-10-CM | POA: Insufficient documentation

## 2014-10-28 ENCOUNTER — Encounter: Payer: Self-pay | Admitting: Internal Medicine

## 2014-10-28 ENCOUNTER — Telehealth: Payer: Self-pay | Admitting: *Deleted

## 2014-10-28 ENCOUNTER — Ambulatory Visit (INDEPENDENT_AMBULATORY_CARE_PROVIDER_SITE_OTHER): Payer: Medicare Other | Admitting: Internal Medicine

## 2014-10-28 VITALS — BP 104/58 | HR 65 | Temp 98.1°F | Ht 59.0 in | Wt 130.0 lb

## 2014-10-28 DIAGNOSIS — R059 Cough, unspecified: Secondary | ICD-10-CM

## 2014-10-28 DIAGNOSIS — J449 Chronic obstructive pulmonary disease, unspecified: Secondary | ICD-10-CM | POA: Diagnosis not present

## 2014-10-28 DIAGNOSIS — R0600 Dyspnea, unspecified: Secondary | ICD-10-CM

## 2014-10-28 DIAGNOSIS — R05 Cough: Secondary | ICD-10-CM | POA: Diagnosis not present

## 2014-10-28 NOTE — Assessment & Plan Note (Signed)
Multifactorial: diastolic congestive heart failure, advanced age, deconditioning, immobility, bronchiectasis   At this time patient has multifactorial purposes for dyspnea, as stated above, supportive care with increased ambulation using a [place] will be beneficial for the patient.  Maintain oxygen saturations greater than 88% , currently on 1 L supplemental oxygen. The bronchiectasis seen on recent CT scan is most likely significant underlying recurrent lung infection with possible aspiration given her advanced age. At this time I believe there is some radiographic lag given that she has good clinical improvement. Aspiration, microaspiration, can cause chronic inflammation and bronchiectasis leading to recurrent lung infections, giving medication such as mucolytics or inhale nebulizers hypertonic saline in this patient may provide temporary benefit but risk and harm are too high. We'll advise to continue with supportive care and treat active/acute infections only.  Plan:  - ambulation with [place] as tolerated - supplemental oxygen to maintain saturations greater than 88% - Supportive care

## 2014-10-28 NOTE — Progress Notes (Signed)
MRN# [id number] ZAWADI BAILIN 1919-07-31   CC: Chief Complaint  Patient presents with  . Follow-up    6 week f/u: Pt had CT chest.      Brief History: 09/2014 HPI:  she is a pleasant 81 year old female seen in consultation today for cough, chest tightness, shortness of breath ; she is accompanied by a caretaker. Patient lives in a nursing facility, [facility].  review of records showed that she was recently in the hospital for CHF exacerbation, pneumonia, she was treated with diuretics She recently saw her cardiologist for diastolic heart failure, noted to have increased cough and rails on physical exam at that visit, was started on Levaquin for 10 days which she completed. 2 days ago she was seen in the ED for complaints of shortness of breath and chest tightness, ED evaluation was completely negative. Patient is heart of hearing and has difficulty seeing also , however is very pleasant and cooperative during exam. she has severe arthritis, currently on Plaquenil. Caretaker states patient is able to ambulate with a Bettes , which she does at times at the nursing facility during the hospitalization in May, there were serial chest x-rays done, that showed possible chronic underlying interstitial changes, I have reviewed a CT chest from 2010 That showed bilateral pneumonias with mild effusion, difficult to appreciate interstitial changes at that time. Review of systems also difficult to obtain, from what I can gather from the patient she worked in a [employer] for a number of years. She is a never smoker. After her last hospitalization she was placed on 3 of supplemental oxygen for chronic hypoxia. Plan - HRCT and supplemental o2 (3L)   Events since last clinic visit: Patient presents today for a follow-up visit of shortness of breath and results of recent CT. Today she is accompanied by a non-medical caretaker, driver for various appointments, who is not  familiar with her medical care or clinical status. At today's visit patient does not have her hearing aid or her glasses. At today's visit it's noted that her oxygen was weaned down to 1 L to maintain saturations greater than 88%. 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 Her cough is improving, but is still mildly productive, unable to obtain color of sputum.    Medication:   Current Outpatient Rx  Name  Route  Sig  Dispense  Refill  . furosemide (LASIX) 20 MG tablet   Oral   Take 20 mg by mouth daily.         . hydroxychloroquine (PLAQUENIL) 200 MG tablet   Oral   Take 200 mg by mouth daily.         . hydroxypropyl methylcellulose / hypromellose (ISOPTO TEARS / GONIOVISC) 2.5 % ophthalmic solution   Both Eyes   Place 1 drop into both eyes daily as needed for dry eyes.         Marland Kitchen levofloxacin (LEVAQUIN) 500 MG tablet   Oral   Take 1 tablet (500 mg total) by mouth daily. Patient taking differently: Take 500 mg by mouth daily. For 14 days   14 tablet   0   . levothyroxine (SYNTHROID, LEVOTHROID) 50 MCG tablet   Oral   Take 50 mcg by mouth daily before breakfast.         . losartan (COZAAR) 50 MG tablet   Oral   Take 50 mg by mouth daily.         Marland Kitchen  Menthol, Topical Analgesic, (BENGAY VANISHING SCENT EX)   Apply externally   Apply 1 application topically 4 (four) times daily.         Marland Kitchen PARoxetine (PAXIL) 10 MG tablet   Oral   Take 10 mg by mouth daily.            Review of Systems: Gen:  Denies  fever, sweats, chills HEENT: Denies blurred vision, double vision, ear pain, eye pain, hearing loss, nose bleeds, sore throat Cvc:  No dizziness, chest pain or heaviness Resp:   Admits to: Gi: Denies swallowing difficulty, stomach pain, nausea or vomiting, diarrhea, constipation, bowel incontinence Gu:  Denies bladder incontinence, burning urine Ext:    No Joint pain, stiffness or swelling Skin: No skin rash, easy bruising or bleeding or hives Endoc:  No polyuria, polydipsia , polyphagia or weight change Other:  All other systems negative  Allergies:  Tramadol  Physical Examination:  VS: BP 104/58 mmHg  Pulse 65  Temp(Src) 98.1 F (36.7 C) (Oral)  Ht 4\' 11"  (1.499 m)  Wt 130 lb (58.968 kg)  BMI 26.24 kg/m2  SpO2 98%  LMP  (LMP Unknown)  General Appearance: No distress  HEENT: PERRLA, no ptosis, no other lesions noticed Pulmonary:normal breath sounds., diaphragmatic excursion normal.No wheezing, No rales   Cardiovascular:  Normal S1,S2.  No m/r/g.     Abdomen:Exam: Benign, Soft, non-tender, No masses  Skin:   warm, no rashes, no ecchymosis  Extremities: normal, no cyanosis, clubbing, warm with normal capillary refill.      Rad results: (The following images and results were reviewed by Dr. ). CT Chest 10/2014 EXAM: CT CHEST WITHOUT CONTRAST  TECHNIQUE: Multidetector CT imaging of the chest was performed following the standard protocol without intravenous contrast. High resolution imaging of the lungs, as well as inspiratory and expiratory imaging, was performed.  COMPARISON: Chest CT 03/21/2014.  FINDINGS: Mediastinum/Lymph Nodes: Heart size is borderline enlarged. There is no significant pericardial fluid, thickening or pericardial calcification. There is atherosclerosis of the thoracic aorta, the great vessels of the mediastinum and the coronary arteries, including calcified atherosclerotic plaque in the left main, left anterior descending, left circumflex and right coronary arteries. Calcifications of the mitral annulus, mitral subvalvular apparatus and aortic root. No pathologically enlarged mediastinal or hilar lymph nodes. Please note that accurate exclusion of hilar adenopathy is limited on noncontrast CT scans. Esophagus is unremarkable in appearance. No axillary lymphadenopathy.  Lungs/Pleura:  High-resolution images demonstrate some patchy areas of faint ground-glass attenuation interspersed with areas of lucency, which have geographic margins, suggesting mild air trapping (inspiratory and expiratory phase imaging is limited by poor patient cooperation). Diffuse bronchial wall thickening. Scattered areas of cylindrical and mild varicose bronchiectasis in the lungs bilaterally, most prevalent in the lower lungs. Some associated interlobular septal thickening, particularly in the lower lungs. Otherwise, there is no significant subpleural reticulation, and no areas of frank honeycombing. No acute consolidative airspace disease. Trace bilateral pleural effusions lying dependently (left greater than right). No definite suspicious appearing pulmonary nodules or masses are identified.  Upper Abdomen: Extensive atherosclerosis.  Musculoskeletal/Soft Tissues: Large benign-appearing calcification in the right breast. Old compression fracture of L1 with post vertebroplasty changes, an approximately 40% loss of anterior vertebral body height, similar to prior study 03/21/2014. There are no aggressive appearing lytic or blastic lesions noted in the visualized portions of the skeleton.  IMPRESSION: 1. Extensive bronchiectasis throughout the lungs bilaterally, with severe bronchial wall thickening. 2. No definite imaging findings to indicate interstitial  lung disease on today's examination. 3. Trace bilateral pleural effusions (left greater than right). 4. Atherosclerosis, including left main and 3 vessel coronary artery disease. 5. There are calcifications of the aortic valve and mitral annulus. Echocardiographic correlation for evaluation of potential valvular dysfunction may be warranted if clinically indicated. 6. Additional incidental findings, as above.    Assessment and Plan: 81 year old female seen in follow-up for dyspnea on exertion Dyspnea  Multifactorial: diastolic  congestive heart failure, advanced age, deconditioning, immobility, bronchiectasis   At this time patient has multifactorial purposes for dyspnea, as stated above, supportive care with increased ambulation using a [place] will be beneficial for the patient.  Maintain oxygen saturations greater than 88% , currently on 1 L supplemental oxygen. The bronchiectasis seen on recent CT scan is most likely significant underlying recurrent lung infection with possible aspiration given her advanced age. At this time I believe there is some radiographic lag given that she has good clinical improvement. Aspiration, microaspiration, can cause chronic inflammation and bronchiectasis leading to recurrent lung infections, giving medication such as mucolytics or inhale nebulizers hypertonic saline in this patient may provide temporary benefit but risk and harm are too high. We'll advise to continue with supportive care and treat active/acute infections only.  Plan:  - ambulation with Henes as tolerated - supplemental oxygen to maintain saturations greater than 88% - Supportive care    Cough  Multifactorial: bronchiectasis, deconditioning, postinfectious, pleural effusion from diastolic heart failure , medication.    Plan:  - improving, supportive care this time    COPD (chronic obstructive pulmonary disease)  Mild-to-moderate COPD,   keeping patient dry , and without any fluid overload will be paramount to her overall respiratory status.  I believe that the addition of inhalers at this time will not provide any major benefit and side effects our weigh any potential benefit.  Plan: -  Supplemental oxygen to maintain oxygen saturations greater 88%, currently on 1 L continuous. -  Ambulation with [place] as tolerated      Updated Medication List Outpatient Encounter Prescriptions as of 10/28/2014  Medication Sig  . furosemide (LASIX) 20 MG tablet Take 20 mg by mouth daily.  . hydroxychloroquine  (PLAQUENIL) 200 MG tablet Take 200 mg by mouth daily.  . hydroxypropyl methylcellulose / hypromellose (ISOPTO TEARS / GONIOVISC) 2.5 % ophthalmic solution Place 1 drop into both eyes daily as needed for dry eyes.  Marland Kitchen levofloxacin (LEVAQUIN) 500 MG tablet Take 1 tablet (500 mg total) by mouth daily. (Patient taking differently: Take 500 mg by mouth daily. For 14 days)  . levothyroxine (SYNTHROID, LEVOTHROID) 50 MCG tablet Take 50 mcg by mouth daily before breakfast.  . losartan (COZAAR) 50 MG tablet Take 50 mg by mouth daily.  . Menthol, Topical Analgesic, (BENGAY VANISHING SCENT EX) Apply 1 application topically 4 (four) times daily.  Marland Kitchen PARoxetine (PAXIL) 10 MG tablet Take 10 mg by mouth daily.   No facility-administered encounter medications on file as of 10/28/2014.    Orders for this visit: No orders of the defined types were placed in this encounter.    Thank  you for the visitation and for allowing  [facility] to assist in the care of your patient. Our recommendations are noted above.  Please contact us if we can be of further service.  Stephanie Acre, MD [facility] Office Number: [phone]

## 2014-10-28 NOTE — Assessment & Plan Note (Signed)
Multifactorial: bronchiectasis, deconditioning, postinfectious, pleural effusion from diastolic heart failure , medication.    Plan:  - improving, supportive care this time

## 2014-10-28 NOTE — Assessment & Plan Note (Signed)
Mild-to-moderate COPD,   keeping patient dry , and without any fluid overload will be paramount to her overall respiratory status.  I believe that the addition of inhalers at this time will not provide any major benefit and side effects our weigh any potential benefit.  Plan: -  Supplemental oxygen to maintain oxygen saturations greater 88%, currently on 1 L continuous. -  Ambulation with Finklea as tolerated

## 2014-10-28 NOTE — Telephone Encounter (Signed)
-----   Message from Stephanie Acre, MD sent at 10/28/2014  9:41 AM EDT ----- Regarding: CT results Please inform patient's caretaker that the CT Chest does not show any masses or tumors, just some findings consistent with COPD and previous infections.   Further details can be discussed at follow up visit.   Thank you.  ----- Message -----    From: Rad Results In Interface    Sent: 10/25/2014   1:46 PM      To: Stephanie Acre, MD

## 2014-10-28 NOTE — Patient Instructions (Signed)
Follow up with Dr. Dema Severin in 6 months - CT Chest with bronchiectasis, but with good clinically improvement (possible radiographic lag) - cont with 1L O2 - cont with exercise\physical therapy as tolerated   Bronchiectasis Bronchiectasis is a condition in which the airways (bronchi) are damaged and widened. This makes it difficult for the lungs to get rid of mucus. As a result, mucus gathers in the airways, and this often leads to lung infections. Infection can cause inflammation in the airways, which may further weaken and damage the bronchi.  CAUSES  Bronchiectasis may be present at birth (congenital) or may develop later in life. Sometimes there is no apparent cause. Some common causes include:  Cystic fibrosis.   Recurrent lung infections (such as pneumonia, tuberculosis, or fungal infections).  Foreign bodies or other blockages in the lungs.  Breathing in fluid, food, or other foreign objects (aspiration). SIGNS AND SYMPTOMS  Common symptoms include:  A daily cough that brings up mucus and lasts for more than 3 weeks.  Frequent lung infections (such as pneumonia, tuberculosis, or fungal infections).  Shortness of breath and wheezing.   Weakness and fatigue. DIAGNOSIS  Various tests may be done to help diagnose bronchiectasis. Tests may include:  Chest X-rays or CT scans.   Breathing tests to help determine how your lungs are working.   Sputum cultures to check for infection.   Blood tests and other tests to check for related diseases or causes, such as cystic fibrosis. TREATMENT  Treatment varies depending on the severity of the condition. Medicines may be given to loosen the mucus to be coughed up (expectorants), to relax the muscles of the air passages (bronchodilators), or to prevent or treat infections (antibiotics). Physical therapy methods may be recommended to help clear mucus from the lungs. For severe cases, surgery may be done to remove the affected part of  the lung. HOME CARE INSTRUCTIONS   Get plenty of rest.   Only take over-the-counter or prescription medicines as directed by your health care provider. If antibiotic medicines were prescribed, take them as directed. Finish them even if you start to feel better.  Avoid sedatives and antihistamines unless otherwise directed by your health care provider. These medicines tend to thicken the mucus in the lungs.   Perform any breathing exercises or techniques to clear the lungs as directed by your health care provider.  Drink enough fluids to keep your urine clear or pale yellow.  Consider using a cold steam vaporizer or humidifier in your room or home to help loosen secretions.   If the cough is worse at night, try sleeping in a semi-upright position in a recliner or using a couple of pillows.   Avoid cigarette smoke and lung irritants. If you smoke, quit.  Stay inside when pollution and ozone levels are high.   Stay current with vaccinations and immunizations.   Follow up with your health care provider as directed.  SEEK MEDICAL CARE IF:  You cough up more thick, discolored mucus (sputum) that is yellow to green in color.  You have a fever or persistent symptoms for more than 2-3 days.  You cannot control your cough and are losing sleep. SEEK IMMEDIATE MEDICAL CARE IF:   You cough up blood.   You have chest pain or increasing shortness of breath.   You have pain that is getting worse or is uncontrolled with medicines.   You have a fever and your symptoms suddenly get worse. MAKE SURE YOU:  Understand these instructions.  Will watch your condition.   Will get help right away if you are not doing well or get worse.  Document Released: 01/17/2007 Document Revised: 03/27/2013 Document Reviewed: 09/27/2012 [facility] Patient Information 2015 [place], [place]. This information is not intended to replace advice given to you by your health care provider. Make sure  you discuss any questions you have with your health care provider.

## 2014-10-28 NOTE — Telephone Encounter (Signed)
Spoke to [place] with [employer]. Informed of Ct chest results. Pt as 2:30 appt today as well with Dr. Dema Severin.

## 2014-12-30 ENCOUNTER — Emergency Department
Admission: EM | Admit: 2014-12-30 | Discharge: 2014-12-30 | Disposition: A | Discharge status: Home / Self Care | Payer: Medicare Other | Attending: Emergency Medicine | Admitting: Emergency Medicine

## 2014-12-30 ENCOUNTER — Other Ambulatory Visit: Payer: Self-pay

## 2014-12-30 ENCOUNTER — Emergency Department: Payer: Medicare Other

## 2014-12-30 ENCOUNTER — Encounter: Payer: Self-pay | Admitting: Emergency Medicine

## 2014-12-30 DIAGNOSIS — I10 Essential (primary) hypertension: Secondary | ICD-10-CM | POA: Diagnosis not present

## 2014-12-30 DIAGNOSIS — H9190 Unspecified hearing loss, unspecified ear: Secondary | ICD-10-CM | POA: Diagnosis not present

## 2014-12-30 DIAGNOSIS — Z792 Long term (current) use of antibiotics: Secondary | ICD-10-CM | POA: Insufficient documentation

## 2014-12-30 DIAGNOSIS — R109 Unspecified abdominal pain: Secondary | ICD-10-CM | POA: Diagnosis not present

## 2014-12-30 DIAGNOSIS — J441 Chronic obstructive pulmonary disease with (acute) exacerbation: Secondary | ICD-10-CM | POA: Insufficient documentation

## 2014-12-30 DIAGNOSIS — Z79899 Other long term (current) drug therapy: Secondary | ICD-10-CM | POA: Diagnosis not present

## 2014-12-30 DIAGNOSIS — R0602 Shortness of breath: Secondary | ICD-10-CM | POA: Diagnosis present

## 2014-12-30 LAB — CBC WITH DIFFERENTIAL/PLATELET
Basophils Absolute: 0 10*3/uL (ref 0–0.1)
Basophils Relative: 1 %
Eosinophils Absolute: 0.1 10*3/uL (ref 0–0.7)
Eosinophils Relative: 3 %
HEMATOCRIT: 31.2 % — AB (ref 35.0–47.0)
HEMOGLOBIN: 10 g/dL — AB (ref 12.0–16.0)
LYMPHS ABS: 0.8 10*3/uL — AB (ref 1.0–3.6)
Lymphocytes Relative: 16 %
MCH: 30.8 pg (ref 26.0–34.0)
MCHC: 32.2 g/dL (ref 32.0–36.0)
MCV: 95.7 fL (ref 80.0–100.0)
MONOS PCT: 14 %
Monocytes Absolute: 0.7 10*3/uL (ref 0.2–0.9)
NEUTROS ABS: 3.2 10*3/uL (ref 1.4–6.5)
NEUTROS PCT: 66 %
Platelets: 189 10*3/uL (ref 150–440)
RBC: 3.26 MIL/uL — ABNORMAL LOW (ref 3.80–5.20)
RDW: 14.6 % — ABNORMAL HIGH (ref 11.5–14.5)
WBC: 4.9 10*3/uL (ref 3.6–11.0)

## 2014-12-30 LAB — BASIC METABOLIC PANEL
ANION GAP: 10 (ref 5–15)
BUN: 36 mg/dL — ABNORMAL HIGH (ref 6–20)
CHLORIDE: 100 mmol/L — AB (ref 101–111)
CO2: 31 mmol/L (ref 22–32)
Calcium: 8.8 mg/dL — ABNORMAL LOW (ref 8.9–10.3)
Creatinine, Ser: 1.32 mg/dL — ABNORMAL HIGH (ref 0.44–1.00)
GFR calc non Af Amer: 34 mL/min — ABNORMAL LOW (ref >60)
GFR, EST AFRICAN AMERICAN: 39 mL/min — AB (ref >60)
Glucose, Bld: 110 mg/dL — ABNORMAL HIGH (ref 65–99)
Potassium: 4.9 mmol/L (ref 3.5–5.1)
Sodium: 141 mmol/L (ref 135–145)

## 2014-12-30 LAB — URINALYSIS COMPLETE WITH MICROSCOPIC (ARMC ONLY)
BILIRUBIN URINE: NEGATIVE
Bacteria, UA: NONE SEEN
GLUCOSE, UA: NEGATIVE mg/dL
HGB URINE DIPSTICK: NEGATIVE
Ketones, ur: NEGATIVE mg/dL
NITRITE: NEGATIVE
Protein, ur: NEGATIVE mg/dL
Specific Gravity, Urine: 1.008 (ref 1.005–1.030)
pH: 7 (ref 5.0–8.0)

## 2014-12-30 MED ORDER — IPRATROPIUM-ALBUTEROL 0.5-2.5 (3) MG/3ML IN SOLN
3.0000 mL | Freq: Once | RESPIRATORY_TRACT | Status: AC
  Administered 2014-12-30: 3 mL via RESPIRATORY_TRACT
  Filled 2014-12-30: qty 3

## 2014-12-30 MED ORDER — PREDNISONE 20 MG PO TABS: 20.0000 mg | ORAL_TABLET | Freq: Every day | ORAL | Status: DC

## 2014-12-30 MED ORDER — METHYLPREDNISOLONE SODIUM SUCC 125 MG IJ SOLR
62.5000 mg | Freq: Once | INTRAMUSCULAR | Status: DC
  Filled 2014-12-30: qty 2

## 2014-12-30 MED ORDER — PREDNISONE 20 MG PO TABS
40.0000 mg | ORAL_TABLET | Freq: Once | ORAL | Status: AC
  Administered 2014-12-30: 40 mg via ORAL

## 2014-12-30 MED ORDER — AZITHROMYCIN 250 MG PO TABS
500.0000 mg | ORAL_TABLET | Freq: Once | ORAL | Status: AC
  Administered 2014-12-30: 500 mg via ORAL
  Filled 2014-12-30: qty 2

## 2014-12-30 MED ORDER — AZITHROMYCIN 250 MG PO TABS: ORAL_TABLET | ORAL | Status: DC

## 2014-12-30 MED ORDER — ALBUTEROL SULFATE (2.5 MG/3ML) 0.083% IN NEBU
2.5000 mg | INHALATION_SOLUTION | Freq: Once | RESPIRATORY_TRACT | Status: AC
  Administered 2014-12-30: 2.5 mg via RESPIRATORY_TRACT
  Filled 2014-12-30: qty 3

## 2014-12-30 MED ORDER — PREDNISONE 20 MG PO TABS
ORAL_TABLET | ORAL | Status: AC
  Filled 2014-12-30: qty 2

## 2014-12-30 NOTE — ED Notes (Addendum)
Tammy from [facility] contacted with report. States will be in to transport patient. States will be one hour to her arrival.  Phone [phone]

## 2014-12-30 NOTE — ED Notes (Signed)
Attempt IV x3 unsuccessful

## 2014-12-30 NOTE — ED Notes (Signed)
Short of breath x 2 days ?

## 2014-12-30 NOTE — Discharge Instructions (Signed)

## 2014-12-30 NOTE — ED Provider Notes (Signed)
[facility] Emergency Department Provider Note  ____________________________________________  Time seen: 12:30 PM  I have reviewed the triage vital signs and the nursing notes.   HISTORY  Chief Complaint Shortness of Breath    HPI Karen Bryan is a 81 y.o. female who complains of shortness of breath for the past 2 days. Denies any cough or fever. Normal oral intake. No nausea vomiting diarrhea or abdominal pain. No back pain. With the shortness of breath she feels some mild diffuse chest tightness. This feels like her COPD and EMS gave her one albuterol treatment on her way over which feels like it is helping. She also reports that the chest tightness elements of it is chronic and that she's felt like that for many months. Is not exertional, not pleuritic     Past Medical History  Diagnosis Date  . COPD (chronic obstructive pulmonary disease)   . CHF (congestive heart failure)   . Hypertension   . Bilateral pneumonia   . Chronic respiratory failure   . Bilateral pleural effusion      Patient Active Problem List   Diagnosis Date Noted  . Cough 10/03/2014  . Dyspnea 10/03/2014  . ILD (interstitial lung disease) 10/03/2014  . COPD exacerbation 09/26/2014  . Hard of hearing 09/26/2014  . Hypertensive urgency 08/10/2014  . Acute on chronic diastolic heart failure 08/10/2014  . Hypertension, essential 08/10/2014  . Rheumatoid arthritis 08/10/2014  . COPD (chronic obstructive pulmonary disease) 08/10/2014     Past Surgical History  Procedure Laterality Date  . Hip surgery       Current Outpatient Rx  Name  Route  Sig  Dispense  Refill  . furosemide (LASIX) 20 MG tablet   Oral   Take 40 mg by mouth daily.          . hydroxychloroquine (PLAQUENIL) 200 MG tablet   Oral   Take 200 mg by mouth daily.         Marland Kitchen levothyroxine (SYNTHROID, LEVOTHROID) 50 MCG tablet   Oral   Take 50 mcg by mouth daily before breakfast.         .  losartan (COZAAR) 50 MG tablet   Oral   Take 50 mg by mouth daily.         . Menthol, Topical Analgesic, (BENGAY VANISHING SCENT EX)   Apply externally   Apply 1 application topically 4 (four) times daily as needed.          Marland Kitchen PARoxetine (PAXIL) 10 MG tablet   Oral   Take 10 mg by mouth daily.         Marland Kitchen azithromycin (ZITHROMAX Z-PAK) 250 MG tablet      Take 2 tablets (500 mg) on  Day 1,  followed by 1 tablet (250 mg) once daily on Days 2 through 5.   6 each   0   . hydroxypropyl methylcellulose / hypromellose (ISOPTO TEARS / GONIOVISC) 2.5 % ophthalmic solution   Both Eyes   Place 1 drop into both eyes daily as needed for dry eyes.         Marland Kitchen levofloxacin (LEVAQUIN) 500 MG tablet   Oral   Take 1 tablet (500 mg total) by mouth daily. Patient taking differently: Take 500 mg by mouth daily. For 14 days   14 tablet   0   . predniSONE (DELTASONE) 20 MG tablet   Oral   Take 1 tablet (20 mg total) by mouth daily.   8 tablet  0      Allergies Tramadol   Family History  Problem Relation Age of Onset  . Pneumonia Mother   . Arthritis Father     Social History Social History  Substance Use Topics  . Smoking status: Never Smoker   . Smokeless tobacco: Never Used  . Alcohol Use: No    Review of Systems  Constitutional:   No fever or chills. No weight changes Eyes:   No blurry vision or double vision.  ENT:   No sore throat. Cardiovascular:   Mild chronic chest pain. Respiratory:   Positive dyspnea without cough. Gastrointestinal:   Negative for abdominal pain, vomiting and diarrhea.  No BRBPR or melena. Genitourinary:   Negative for dysuria, urinary retention, bloody urine, or difficulty urinating. Musculoskeletal:   Negative for back pain. No joint swelling or pain. Skin:   Negative for rash. Neurological:   Negative for headaches, focal weakness or numbness. Psychiatric:  No anxiety or depression.   Endocrine:  No hot/cold intolerance, changes in  energy, or sleep difficulty.  10-point ROS otherwise negative.  ____________________________________________   PHYSICAL EXAM:  VITAL SIGNS: ED Triage Vitals  Enc Vitals Group     BP 12/30/14 1233 178/94 mmHg     Pulse Rate 12/30/14 1233 68     Resp 12/30/14 1233 20     Temp 12/30/14 1233 97.5 F (36.4 C)     Temp Source 12/30/14 1233 Oral     SpO2 12/30/14 1233 100 %     Weight 12/30/14 1233 140 lb 10.5 oz (63.8 kg)     Height 12/30/14 1233 5\' 3"  (1.6 m)     Head Cir --      Peak Flow --      Pain Score --      Pain Loc --      Pain Edu? --      Excl. in GC? --      Constitutional:   Alert and oriented. Well appearing and in no distress. Hard of hearing Eyes:   No scleral icterus. No conjunctival pallor. PERRL. EOMI ENT   Head:   Normocephalic and atraumatic.   Nose:   No congestion/rhinnorhea. No septal hematoma   Mouth/Throat:   MMM, no pharyngeal erythema. No peritonsillar mass. No uvula shift.   Neck:   No stridor. No SubQ emphysema. No meningismus. No JVD Hematological/Lymphatic/Immunilogical:   No cervical lymphadenopathy. Cardiovascular:   RRR. Normal and symmetric distal pulses are present in all extremities. No murmurs, rubs, or gallops. Respiratory:   Normal respiratory effort without tachypnea nor retractions. Diffuse rhonchi, diffuse wheezing with forced expiration. Gastrointestinal:   Soft with suprapubic tenderness. No distention. There is no CVA tenderness.  No rebound, rigidity, or guarding. Genitourinary:   deferred Musculoskeletal:   Nontender with normal range of motion in all extremities. No joint effusions.  No lower extremity tenderness.  No edema. Neurologic:   Normal speech and language.  CN 2-10 normal. Motor grossly intact. No pronator drift.  Normal gait. No gross focal neurologic deficits are appreciated.  Skin:    Skin is warm, dry and intact. No rash noted.  No petechiae, purpura, or bullae. Psychiatric:   Mood and affect are  normal. Speech and behavior are normal. Patient exhibits appropriate insight and judgment.  ____________________________________________    LABS (pertinent positives/negatives) (all labs ordered are listed, but only abnormal results are displayed) Labs Reviewed  URINALYSIS COMPLETEWITH MICROSCOPIC ([facility] ONLY) - Abnormal; Notable for the following:    Color,  Urine YELLOW (*)    APPearance CLEAR (*)    Leukocytes, UA TRACE (*)    Squamous Epithelial / LPF 0-5 (*)    All other components within normal limits  BASIC METABOLIC PANEL - Abnormal; Notable for the following:    Chloride 100 (*)    Glucose, Bld 110 (*)    BUN 36 (*)    Creatinine, Ser 1.32 (*)    Calcium 8.8 (*)    GFR calc non Af Amer 34 (*)    GFR calc Af Amer 39 (*)    All other components within normal limits  CBC WITH DIFFERENTIAL/PLATELET - Abnormal; Notable for the following:    RBC 3.26 (*)    Hemoglobin 10.0 (*)    HCT 31.2 (*)    RDW 14.6 (*)    Lymphs Abs 0.8 (*)    All other components within normal limits  CBC WITH DIFFERENTIAL/PLATELET   ____________________________________________   EKG    ____________________________________________    RADIOLOGY  Chest x-ray unremarkable. Chronic bronchitic changes  ____________________________________________   PROCEDURES   ____________________________________________   INITIAL IMPRESSION / ASSESSMENT AND PLAN / ED COURSE  Pertinent labs & imaging results that were available during my care of the patient were reviewed by me and considered in my medical decision making (see chart for details).  Patient presents with shortness of breath in the setting of crackles and wheezing which very likely represents a COPD exacerbation. She is feeling better after one treatment with albuterol already and we will continue bronchodilators and a low dose of steroid while checking some labs urinalysis and chest x-ray. She is very well-appearing and anticipate she'll  be suitable for discharge home if no significant workup findings are revealed. ----------------------------------------- 6:43 PM on 12/30/2014 -----------------------------------------  Chest x-ray and labs nonacute. Patient improved with bronchodilators and steroids. We'll keep her on low-dose steroids as well as azithromycin for the next few days and have her follow-up with her primary care doctor. No sepsis, no ACS PE TAD mediastinitis carditis. No pneumothorax    ____________________________________________   FINAL CLINICAL IMPRESSION(S) / ED DIAGNOSES  Final diagnoses:  COPD exacerbation      Sharman Cheek, MD 12/30/14 1844

## 2015-01-03 ENCOUNTER — Ambulatory Visit (INDEPENDENT_AMBULATORY_CARE_PROVIDER_SITE_OTHER): Payer: Medicare Other | Admitting: Cardiovascular Disease

## 2015-01-03 ENCOUNTER — Encounter: Payer: Self-pay | Admitting: Cardiovascular Disease

## 2015-01-03 VITALS — BP 167/70 | HR 64 | Ht 60.0 in | Wt 135.8 lb

## 2015-01-03 DIAGNOSIS — R0600 Dyspnea, unspecified: Secondary | ICD-10-CM

## 2015-01-03 DIAGNOSIS — J441 Chronic obstructive pulmonary disease with (acute) exacerbation: Secondary | ICD-10-CM | POA: Diagnosis not present

## 2015-01-03 DIAGNOSIS — I5033 Acute on chronic diastolic (congestive) heart failure: Secondary | ICD-10-CM

## 2015-01-03 DIAGNOSIS — J849 Interstitial pulmonary disease, unspecified: Secondary | ICD-10-CM

## 2015-01-03 DIAGNOSIS — I10 Essential (primary) hypertension: Secondary | ICD-10-CM

## 2015-01-03 DIAGNOSIS — R0789 Other chest pain: Secondary | ICD-10-CM

## 2015-01-03 DIAGNOSIS — H9193 Unspecified hearing loss, bilateral: Secondary | ICD-10-CM

## 2015-01-03 NOTE — Patient Instructions (Addendum)
You are doing well. Your lungs are "tight", from the bronchitis and bronchiectasis I can hear wheezing on exam They have you on prednisone and antibiotics for the lungs  No medication changes were made today From a cardiac perspective, You are doing well.  Please call us if you have new issues that need to be addressed before your next appt.  Your physician wants you to follow-up in: 12 months.  You will receive a reminder letter in the mail two months in advance. If you don't receive a letter, please call our office to schedule the follow-up appointment.

## 2015-01-03 NOTE — Assessment & Plan Note (Signed)
Blood pressure is well controlled on today's visit. No changes made to the medications. 

## 2015-01-03 NOTE — Assessment & Plan Note (Signed)
Chronic shortness of breath, secondary to underlying lung disease

## 2015-01-03 NOTE — Assessment & Plan Note (Signed)
Recommended close follow-up with pulmonary. May need chronic prednisone for shortness of breath

## 2015-01-03 NOTE — Progress Notes (Signed)
Patient ID: Karen Bryan, female    DOB: Aug 04, 1919, 81 y.o.   MRN: [id number]  HPI Comments: 81 y.o. female with history of chronic diastolic CHF, chronic respiratory failure on 2 L home oxygen but never smoked, recurrent PNA with possible aspiration, and RA presenting for follow-up of her acute on chronic diastolic CHF She has a history of Chronic lung disease, Suspected pulmonary fibrosis/interstitial lung disease Bilateral interstitial thickening on CXR, chronic issue  She is hard of hearing, currently does not have hearing aids We have to write everything for her to read about her condition  Recent CT scan showing bronchitis and bronchiectasis as well as three-vessel coronary artery disease Recent lab work showing hematocrit 31, creatinine 1.3  Hospitalization May 2016 for COPD flare, possible diastolic CHF Emergency room visit September 26 for hypoxia, chest x-ray showing bronchitis and bronchiectasis  Currently she is on azithromycin and prednisone taper Continued cough, wheezing, mild shortness of breath. Appears comfortable She continues on Lasix 60 mg daily  EKG on today's visit shows normal sinus rhythm with rate 64 bpm, no significant ST or T-wave changes     Allergies  Allergen Reactions  . Tramadol Nausea Only    Current Outpatient Prescriptions on File Prior to Visit  Medication Sig Dispense Refill  . azithromycin (ZITHROMAX Z-PAK) 250 MG tablet Take 2 tablets (500 mg) on  Day 1,  followed by 1 tablet (250 mg) once daily on Days 2 through 5. 6 each 0  . furosemide (LASIX) 20 MG tablet Take 60 mg by mouth daily.     . hydroxychloroquine (PLAQUENIL) 200 MG tablet Take 200 mg by mouth daily.    Marland Kitchen levothyroxine (SYNTHROID, LEVOTHROID) 50 MCG tablet Take 50 mcg by mouth daily before breakfast.    . losartan (COZAAR) 50 MG tablet Take 50 mg by mouth daily.    . Menthol, Topical Analgesic, (BENGAY VANISHING SCENT EX) Apply 1 application topically 4 (four) times daily as  needed.     Marland Kitchen PARoxetine (PAXIL) 10 MG tablet Take 10 mg by mouth daily.    . predniSONE (DELTASONE) 20 MG tablet Take 1 tablet (20 mg total) by mouth daily. 8 tablet 0   No current facility-administered medications on file prior to visit.    Past Medical History  Diagnosis Date  . COPD (chronic obstructive pulmonary disease)   . CHF (congestive heart failure)   . Hypertension   . Bilateral pneumonia   . Chronic respiratory failure   . Bilateral pleural effusion     Past Surgical History  Procedure Laterality Date  . Hip surgery      Social History  reports that she has never smoked. She has never used smokeless tobacco. She reports that she does not drink alcohol or use illicit drugs.  Family History family history includes Arthritis in her father; Pneumonia in her mother.   Review of Systems  Unable to perform ROS Constitutional: Negative.   Respiratory: Negative.   Cardiovascular: Negative.   Gastrointestinal: Negative.   Endocrine: Negative.   Musculoskeletal: Negative.   Neurological: Negative.   Hematological: Negative.   Psychiatric/Behavioral: Negative.   All other systems reviewed and are negative.    BP 167/70 mmHg  Pulse 64  Ht 5' (1.524 m)  Wt 135 lb 12 oz (61.576 kg)  BMI 26.51 kg/m2  LMP  (LMP Unknown)  Physical Exam  Constitutional: She is oriented to person, place, and time. She appears well-nourished.  HENT:  Head: Normocephalic.  Nose: Nose  normal.  Mouth/Throat: Oropharynx is clear and moist.  Eyes: Conjunctivae are normal.  Neck: Normal range of motion. Neck supple. No JVD present.  Cardiovascular: Normal rate, regular rhythm, normal heart sounds and intact distal pulses.  Exam reveals no gallop and no friction rub.   No murmur heard. Pulmonary/Chest: Effort normal. No respiratory distress. She has decreased breath sounds. She has rales. She exhibits no tenderness.  Abdominal: Soft. Bowel sounds are normal. She exhibits no distension.  There is no tenderness.  Musculoskeletal: Normal range of motion. She exhibits no edema or tenderness.  Lymphadenopathy:    She has no cervical adenopathy.  Neurological: She is alert and oriented to person, place, and time. Coordination normal.  Skin: Skin is warm and dry. No rash noted. No erythema.  Psychiatric: Her behavior is normal.

## 2015-01-03 NOTE — Assessment & Plan Note (Signed)
She appears relatively euvolemic on today's visit. Recommended she continue on Lasix 60 mg daily. Creatinine 1.3 We'll try to avoid overdiuresis

## 2015-01-03 NOTE — Assessment & Plan Note (Signed)
Unable to talk well with the patient today. Had to write everything She does not have hearing aids

## 2015-01-03 NOTE — Assessment & Plan Note (Signed)
Currently being treated for bronchitis/bronchiectasis. She has chronic hypoxia, on azithromycin and prednisone pulse taper

## 2015-03-04 ENCOUNTER — Emergency Department: Payer: Medicare Other

## 2015-03-04 ENCOUNTER — Inpatient Hospital Stay
Admission: EM | Admit: 2015-03-04 | Discharge: 2015-03-06 | DRG: 292 | Disposition: A | Discharge status: Home / Self Care | Payer: Medicare Other | Attending: Internal Medicine | Admitting: Internal Medicine

## 2015-03-04 ENCOUNTER — Inpatient Hospital Stay (HOSPITAL_COMMUNITY)
Admit: 2015-03-04 | Discharge: 2015-03-04 | Disposition: A | Discharge status: Home / Self Care | Payer: Medicare Other | Attending: Internal Medicine | Admitting: Internal Medicine

## 2015-03-04 ENCOUNTER — Inpatient Hospital Stay: Payer: Medicare Other

## 2015-03-04 DIAGNOSIS — Y92122 Bedroom in nursing home as the place of occurrence of the external cause: Secondary | ICD-10-CM

## 2015-03-04 DIAGNOSIS — Z8261 Family history of arthritis: Secondary | ICD-10-CM

## 2015-03-04 DIAGNOSIS — Z888 Allergy status to other drugs, medicaments and biological substances status: Secondary | ICD-10-CM

## 2015-03-04 DIAGNOSIS — W19XXXA Unspecified fall, initial encounter: Secondary | ICD-10-CM

## 2015-03-04 DIAGNOSIS — I10 Essential (primary) hypertension: Secondary | ICD-10-CM | POA: Diagnosis present

## 2015-03-04 DIAGNOSIS — J9610 Chronic respiratory failure, unspecified whether with hypoxia or hypercapnia: Secondary | ICD-10-CM | POA: Diagnosis present

## 2015-03-04 DIAGNOSIS — I5033 Acute on chronic diastolic (congestive) heart failure: Principal | ICD-10-CM | POA: Diagnosis present

## 2015-03-04 DIAGNOSIS — Z9889 Other specified postprocedural states: Secondary | ICD-10-CM | POA: Diagnosis not present

## 2015-03-04 DIAGNOSIS — Z66 Do not resuscitate: Secondary | ICD-10-CM | POA: Diagnosis present

## 2015-03-04 DIAGNOSIS — Z79899 Other long term (current) drug therapy: Secondary | ICD-10-CM

## 2015-03-04 DIAGNOSIS — H547 Unspecified visual loss: Secondary | ICD-10-CM | POA: Diagnosis present

## 2015-03-04 DIAGNOSIS — H9190 Unspecified hearing loss, unspecified ear: Secondary | ICD-10-CM | POA: Diagnosis present

## 2015-03-04 DIAGNOSIS — S81811A Laceration without foreign body, right lower leg, initial encounter: Secondary | ICD-10-CM | POA: Diagnosis present

## 2015-03-04 DIAGNOSIS — E119 Type 2 diabetes mellitus without complications: Secondary | ICD-10-CM | POA: Diagnosis present

## 2015-03-04 DIAGNOSIS — Z9981 Dependence on supplemental oxygen: Secondary | ICD-10-CM

## 2015-03-04 DIAGNOSIS — R55 Syncope and collapse: Secondary | ICD-10-CM

## 2015-03-04 DIAGNOSIS — E039 Hypothyroidism, unspecified: Secondary | ICD-10-CM | POA: Diagnosis present

## 2015-03-04 DIAGNOSIS — I340 Nonrheumatic mitral (valve) insufficiency: Secondary | ICD-10-CM

## 2015-03-04 DIAGNOSIS — M1990 Unspecified osteoarthritis, unspecified site: Secondary | ICD-10-CM | POA: Diagnosis present

## 2015-03-04 DIAGNOSIS — S81812A Laceration without foreign body, left lower leg, initial encounter: Secondary | ICD-10-CM | POA: Diagnosis present

## 2015-03-04 DIAGNOSIS — J90 Pleural effusion, not elsewhere classified: Secondary | ICD-10-CM | POA: Diagnosis present

## 2015-03-04 DIAGNOSIS — J810 Acute pulmonary edema: Secondary | ICD-10-CM

## 2015-03-04 DIAGNOSIS — I509 Heart failure, unspecified: Secondary | ICD-10-CM | POA: Diagnosis present

## 2015-03-04 DIAGNOSIS — E8770 Fluid overload, unspecified: Secondary | ICD-10-CM | POA: Diagnosis present

## 2015-03-04 DIAGNOSIS — J449 Chronic obstructive pulmonary disease, unspecified: Secondary | ICD-10-CM | POA: Diagnosis present

## 2015-03-04 DIAGNOSIS — R0600 Dyspnea, unspecified: Secondary | ICD-10-CM

## 2015-03-04 LAB — CBC
HCT: 31.8 % — ABNORMAL LOW (ref 35.0–47.0)
Hemoglobin: 10.1 g/dL — ABNORMAL LOW (ref 12.0–16.0)
MCH: 30.3 pg (ref 26.0–34.0)
MCHC: 31.7 g/dL — ABNORMAL LOW (ref 32.0–36.0)
MCV: 95.4 fL (ref 80.0–100.0)
PLATELETS: 225 10*3/uL (ref 150–440)
RBC: 3.34 MIL/uL — AB (ref 3.80–5.20)
RDW: 14.3 % (ref 11.5–14.5)
WBC: 6.1 10*3/uL (ref 3.6–11.0)

## 2015-03-04 LAB — BASIC METABOLIC PANEL
ANION GAP: 8 (ref 5–15)
BUN: 29 mg/dL — AB (ref 6–20)
CALCIUM: 8.9 mg/dL (ref 8.9–10.3)
CHLORIDE: 98 mmol/L — AB (ref 101–111)
CO2: 34 mmol/L — ABNORMAL HIGH (ref 22–32)
CREATININE: 0.99 mg/dL (ref 0.44–1.00)
GFR calc Af Amer: 55 mL/min — ABNORMAL LOW (ref >60)
GFR, EST NON AFRICAN AMERICAN: 48 mL/min — AB (ref >60)
GLUCOSE: 134 mg/dL — AB (ref 65–99)
POTASSIUM: 4 mmol/L (ref 3.5–5.1)
Sodium: 140 mmol/L (ref 135–145)

## 2015-03-04 LAB — URINALYSIS COMPLETE WITH MICROSCOPIC (ARMC ONLY)
Bilirubin Urine: NEGATIVE
GLUCOSE, UA: NEGATIVE mg/dL
KETONES UR: NEGATIVE mg/dL
LEUKOCYTES UA: NEGATIVE
NITRITE: NEGATIVE
PROTEIN: 30 mg/dL — AB
SPECIFIC GRAVITY, URINE: 1.011 (ref 1.005–1.030)
pH: 8 (ref 5.0–8.0)

## 2015-03-04 LAB — TROPONIN I: Troponin I: <0.03 ng/mL (ref <0.031)

## 2015-03-04 LAB — MRSA PCR SCREENING: MRSA BY PCR: NEGATIVE

## 2015-03-04 MED ORDER — SODIUM CHLORIDE 0.9 % IV SOLN: 250.0000 mL | INTRAVENOUS | Status: DC | PRN

## 2015-03-04 MED ORDER — ENOXAPARIN SODIUM 30 MG/0.3ML ~~LOC~~ SOLN
30.0000 mg | SUBCUTANEOUS | Status: DC
  Administered 2015-03-04 – 2015-03-05 (×2): 30 mg via SUBCUTANEOUS
  Filled 2015-03-04 (×2): qty 0.3

## 2015-03-04 MED ORDER — SODIUM CHLORIDE 0.9 % IJ SOLN: 3.0000 mL | INTRAMUSCULAR | Status: DC | PRN

## 2015-03-04 MED ORDER — ENOXAPARIN SODIUM 40 MG/0.4ML ~~LOC~~ SOLN: 40.0000 mg | SUBCUTANEOUS | Status: DC

## 2015-03-04 MED ORDER — FUROSEMIDE 10 MG/ML IJ SOLN
40.0000 mg | Freq: Two times a day (BID) | INTRAMUSCULAR | Status: DC
  Administered 2015-03-04: 40 mg via INTRAVENOUS
  Filled 2015-03-04: qty 4

## 2015-03-04 MED ORDER — PAROXETINE HCL 10 MG PO TABS
10.0000 mg | ORAL_TABLET | Freq: Every day | ORAL | Status: DC
  Administered 2015-03-04 – 2015-03-06 (×3): 10 mg via ORAL
  Filled 2015-03-04 (×3): qty 1

## 2015-03-04 MED ORDER — ONDANSETRON HCL 4 MG/2ML IJ SOLN: 4.0000 mg | Freq: Four times a day (QID) | INTRAMUSCULAR | Status: DC | PRN

## 2015-03-04 MED ORDER — CARVEDILOL 3.125 MG PO TABS
3.1250 mg | ORAL_TABLET | Freq: Two times a day (BID) | ORAL | Status: DC
  Administered 2015-03-04 – 2015-03-06 (×4): 3.125 mg via ORAL
  Filled 2015-03-04 (×5): qty 1

## 2015-03-04 MED ORDER — LEVOTHYROXINE SODIUM 50 MCG PO TABS
50.0000 ug | ORAL_TABLET | Freq: Every day | ORAL | Status: DC
  Administered 2015-03-04 – 2015-03-06 (×3): 50 ug via ORAL
  Filled 2015-03-04 (×3): qty 1

## 2015-03-04 MED ORDER — HYDROXYCHLOROQUINE SULFATE 200 MG PO TABS
200.0000 mg | ORAL_TABLET | Freq: Every day | ORAL | Status: DC
  Administered 2015-03-04 – 2015-03-06 (×3): 200 mg via ORAL
  Filled 2015-03-04 (×3): qty 1

## 2015-03-04 MED ORDER — LIDOCAINE-EPINEPHRINE (PF) 1 %-1:200000 IJ SOLN
INTRAMUSCULAR | Status: AC
  Filled 2015-03-04: qty 30

## 2015-03-04 MED ORDER — SODIUM CHLORIDE 0.9 % IJ SOLN
3.0000 mL | Freq: Two times a day (BID) | INTRAMUSCULAR | Status: DC
Start: 2015-03-04 — End: 2015-03-06
  Administered 2015-03-04 – 2015-03-05 (×4): 3 mL via INTRAVENOUS

## 2015-03-04 MED ORDER — ACETAMINOPHEN 325 MG PO TABS
650.0000 mg | ORAL_TABLET | ORAL | Status: DC | PRN
  Administered 2015-03-05 – 2015-03-06 (×3): 650 mg via ORAL
  Filled 2015-03-04 (×3): qty 2

## 2015-03-04 MED ORDER — LIDOCAINE-EPINEPHRINE (PF) 2 %-1:200000 IJ SOLN
30.0000 mL | Freq: Once | INTRAMUSCULAR | Status: AC
  Administered 2015-03-04: 30 mL

## 2015-03-04 MED ORDER — PREDNISONE 20 MG PO TABS
20.0000 mg | ORAL_TABLET | Freq: Every day | ORAL | Status: DC
  Administered 2015-03-04: 20 mg via ORAL
  Filled 2015-03-04: qty 1

## 2015-03-04 MED ORDER — BACITRACIN ZINC 500 UNIT/GM EX OINT
TOPICAL_OINTMENT | Freq: Once | CUTANEOUS | Status: AC
  Administered 2015-03-04: 1 via TOPICAL
  Filled 2015-03-04: qty 0.9

## 2015-03-04 MED ORDER — FUROSEMIDE 10 MG/ML IJ SOLN
20.0000 mg | Freq: Two times a day (BID) | INTRAMUSCULAR | Status: DC
  Administered 2015-03-04 – 2015-03-05 (×2): 20 mg via INTRAVENOUS
  Filled 2015-03-04 (×2): qty 2

## 2015-03-04 MED ORDER — LOSARTAN POTASSIUM 50 MG PO TABS
50.0000 mg | ORAL_TABLET | Freq: Every day | ORAL | Status: DC
  Administered 2015-03-04 – 2015-03-06 (×3): 50 mg via ORAL
  Filled 2015-03-04 (×3): qty 1

## 2015-03-04 MED ORDER — LIDOCAINE-EPINEPHRINE (PF) 2 %-1:200000 IJ SOLN: 30.0000 mL | Freq: Once | INTRAMUSCULAR | Status: DC

## 2015-03-04 MED ORDER — ASPIRIN EC 81 MG PO TBEC
81.0000 mg | DELAYED_RELEASE_TABLET | Freq: Every day | ORAL | Status: DC
  Administered 2015-03-04 – 2015-03-06 (×3): 81 mg via ORAL
  Filled 2015-03-04 (×3): qty 1

## 2015-03-04 NOTE — Progress Notes (Signed)
Enoxaparin   Patient qualifies for Enoxaparin 30  mg SQ daily based on CrCl <30 ml/min per policy. Will change to Enoxaparin 30 mg SQ daily.  Duard Spiewak D. Filipe Greathouse, PharmD   

## 2015-03-04 NOTE — ED Notes (Signed)
Patient arrived via EMS from [facility] in [place] c/o of a fall which resulted in 2 lacerations, one on both lower legs. EMS Vitals BP 176/82; P 86, R 18, O2 92% on RA.

## 2015-03-04 NOTE — Progress Notes (Signed)
Bilateral skin tears to shins cleansed with saline and redressed. L leg had 15 stitches and R leg has 9 stitches. Scant-minimal serosanguineous drainage from both. Clean dressings (with 4x4 gauze and kerlex) in place, time, dated, and initialed.

## 2015-03-04 NOTE — H&P (Signed)
[facility] -  at [facility] - Closed   PATIENT NAME: Karen Bryan    MR#:  [id number]  DATE OF BIRTH:  05-25-1919  DATE OF ADMISSION:  03/04/2015  PRIMARY CARE PHYSICIAN: Leanna Sato, MD   REQUESTING/REFERRING PHYSICIAN:   CHIEF COMPLAINT:   Chief Complaint  Patient presents with  . Fall    HISTORY OF PRESENT ILLNESS: Karen Bryan  is a 81 y.o. female with a known history of COPD, congestive heart failure, chronic respiratory failure, bilateral pleural effusions presented to the emergency room for fall. Patient is a resident of [facility]. Patient does not remember how she landed on the floor. She fell onto the floor and she has deep laceration carts over the anterior shin and both lower extremities. The laceration wounds were sutured by the emergency room doctor. Patient awake and responds to all verbal commands. She is very hard of hearing. History was obtained from the patient after writing down questions on a piece of paper. No history of any head injury. Patient does not remember the event and probably passed out. No complaints of any chest pain. Has lower extremity swelling secondary to fluid retention. Patient is DO NOT RESUSCITATE by CODE STATUS.  PAST MEDICAL HISTORY:   Past Medical History  Diagnosis Date  . COPD (chronic obstructive pulmonary disease) (HCC)   . CHF (congestive heart failure) (HCC)   . Hypertension   . Bilateral pneumonia   . Chronic respiratory failure (HCC)   . Bilateral pleural effusion     PAST SURGICAL HISTORY:  Past Surgical History  Procedure Laterality Date  . Hip surgery      SOCIAL HISTORY:  Social History  Substance Use Topics  . Smoking status: Never Smoker   . Smokeless tobacco: Never Used  . Alcohol Use: No    FAMILY HISTORY:  Family History  Problem Relation Age of Onset  . Pneumonia Mother   . Arthritis Father     DRUG ALLERGIES:  Allergies  Allergen Reactions  . Tramadol Nausea Only     REVIEW OF SYSTEMS:   CONSTITUTIONAL: No fever, has weakness.  EYES: No blurred or double vision.  EARS, NOSE, AND THROAT: No tinnitus or ear pain.  RESPIRATORY: No cough, shortness of breath, wheezing or hemoptysis.  CARDIOVASCULAR: No chest pain, orthopnea, edema.  GASTROINTESTINAL: No nausea, vomiting, diarrhea or abdominal pain.  GENITOURINARY: No dysuria, hematuria.  ENDOCRINE: No polyuria, nocturia,  HEMATOLOGY: No anemia, easy bruising or bleeding SKIN: deep laceration wounds on both lower extremities.  MUSCULOSKELETAL: pain in the legs secondary to laceration from fall. NEUROLOGIC: No tingling, numbness, weakness.  PSYCHIATRY: No anxiety or depression.   MEDICATIONS AT HOME:  Prior to Admission medications   Medication Sig Start Date End Date Taking? Authorizing Provider  azithromycin (ZITHROMAX Z-PAK) 250 MG tablet Take 2 tablets (500 mg) on  Day 1,  followed by 1 tablet (250 mg) once daily on Days 2 through 5. 12/30/14   Sharman Cheek, MD  furosemide (LASIX) 20 MG tablet Take 60 mg by mouth daily.     Historical Provider, MD  hydroxychloroquine (PLAQUENIL) 200 MG tablet Take 200 mg by mouth daily.    Historical Provider, MD  levothyroxine (SYNTHROID, LEVOTHROID) 50 MCG tablet Take 50 mcg by mouth daily before breakfast.    Historical Provider, MD  losartan (COZAAR) 50 MG tablet Take 50 mg by mouth daily.    Historical Provider, MD  Menthol, Topical Analgesic, (BENGAY VANISHING SCENT EX) Apply 1 application topically  4 (four) times daily as needed.     Historical Provider, MD  PARoxetine (PAXIL) 10 MG tablet Take 10 mg by mouth daily.    Historical Provider, MD  predniSONE (DELTASONE) 20 MG tablet Take 1 tablet (20 mg total) by mouth daily. 12/30/14   Sharman Cheek, MD      PHYSICAL EXAMINATION:   VITAL SIGNS: Blood pressure 187/66, pulse 73, temperature 98.3 F (36.8 C), temperature source Oral, resp. rate 18, height 5\' 3"  (1.6 m), weight 62.596 kg (138 lb), SpO2  93 %.  GENERAL:  81 y.o.-year-old patient lying in the bed with no acute distress.  EYES: Pupils equal, round, reactive to light and accommodation. No scleral icterus. Extraocular muscles intact.  HEENT: Head atraumatic, normocephalic. Oropharynx and nasopharynx clear. Hard of hearing. NECK:  Supple, no jugular venous distention. No thyroid enlargement, no tenderness.  LUNGS: Normal breath sounds bilaterally, no wheezing, basal crepitations heard. No use of accessory muscles of respiration.  CARDIOVASCULAR: S1, S2 normal. No murmurs, rubs, or gallops.  ABDOMEN: Soft, nontender, nondistended. Bowel sounds present. No organomegaly or mass.  EXTREMITIES: pedal edema noted, laceration over the anterior shin in both lower extremities. NEUROLOGIC: Cranial nerves II through XII are intact. Hard of hearing.Muscle strength 5/5 in all extremities. Sensation intact.  PSYCHIATRIC: The patient is alert and oriented x 3.  SKIN: deep laceration over both lower extremities  LABORATORY PANEL:   CBC  Recent Labs Lab 03/04/15 0211  WBC 6.1  HGB 10.1*  HCT 31.8*  PLT 225  MCV 95.4  MCH 30.3  MCHC 31.7*  RDW 14.3   ------------------------------------------------------------------------------------------------------------------  Chemistries   Recent Labs Lab 03/04/15 0211  NA 140  K 4.0  CL 98*  CO2 34*  GLUCOSE 134*  BUN 29*  CREATININE 0.99  CALCIUM 8.9   ------------------------------------------------------------------------------------------------------------------ estimated creatinine clearance is 29.4 mL/min (by C-G formula based on Cr of 0.99). ------------------------------------------------------------------------------------------------------------------ No results for input(s): TSH, T4TOTAL, T3FREE, THYROIDAB in the last 72 hours.  Invalid input(s): FREET3   Coagulation profile No results for input(s): INR, PROTIME in the last 168  hours. ------------------------------------------------------------------------------------------------------------------- No results for input(s): DDIMER in the last 72 hours. -------------------------------------------------------------------------------------------------------------------  Cardiac Enzymes  Recent Labs Lab 03/04/15 0211  TROPONINI <0.03   ------------------------------------------------------------------------------------------------------------------ Invalid input(s): POCBNP  ---------------------------------------------------------------------------------------------------------------  Urinalysis    Component Value Date/Time   COLORURINE YELLOW* 03/04/2015 0605   APPEARANCEUR CLEAR* 03/04/2015 0605   LABSPEC 1.011 03/04/2015 0605   PHURINE 8.0 03/04/2015 0605   GLUCOSEU NEGATIVE 03/04/2015 0605   HGBUR 1+* 03/04/2015 0605   BILIRUBINUR NEGATIVE 03/04/2015 0605   KETONESUR NEGATIVE 03/04/2015 0605   PROTEINUR 30* 03/04/2015 0605   NITRITE NEGATIVE 03/04/2015 0605   LEUKOCYTESUR NEGATIVE 03/04/2015 0605     RADIOLOGY: Ct Head Wo Contrast  03/04/2015  CLINICAL DATA:  Larey Seat.  Lightheadedness. EXAM: CT HEAD WITHOUT CONTRAST TECHNIQUE: Contiguous axial images were obtained from the base of the skull through the vertex without intravenous contrast. COMPARISON:  09/29/2013 FINDINGS: No intracranial hemorrhage or extra-axial fluid collection. There is moderately severe generalized atrophy. There is hemispheric white matter hypodensity consistent with chronic small vessel disease. There is remote lacunar infarction in the right thalamus. No interval change from 09/29/2013. Bilateral mastoid effusions. Visible paranasal sinuses are clear. Calvarium and skullbase are intact. IMPRESSION: 1. Negative for acute intracranial traumatic injury. There is moderately severe generalized atrophy, chronic small vessel ischemic disease and remote right thalamus lacunar infarction. 2.  Bilateral mastoid effusions. These are of indeterminate chronicity but they  are new from 09/29/2013. Electronically Signed   By: Ellery Plunk M.D.   On: 03/04/2015 02:59   Dg Chest Port 1 View  03/04/2015  CLINICAL DATA:  Syncope. EXAM: PORTABLE CHEST 1 VIEW COMPARISON:  12/30/2014 FINDINGS: There is unchanged left hemidiaphragm elevation. There is multifocal ground-glass opacity, predominantly central in distribution. There also is moderate vascular prominence and mild interstitial thickening. The findings may represent congestive heart failure. No large effusions. IMPRESSION: Multifocal central and basilar patchy airspace opacities, suspicious for congestive heart failure with alveolar edema. Electronically Signed   By: Ellery Plunk M.D.   On: 03/04/2015 02:55   Dg Hip Unilat With Pelvis 2-3 Views Left  03/04/2015  CLINICAL DATA:  Left hip pain after fall EXAM: DG HIP (WITH OR WITHOUT PELVIS) 2-3V LEFT COMPARISON:  None. FINDINGS: There are intact appearances of the left total hip arthroplasty. No acute fracture or dislocation. Bony pelvis appears intact. Pubic symphysis and sacroiliac joints appear intact. IMPRESSION: Negative for acute fracture or dislocation. Electronically Signed   By: Ellery Plunk M.D.   On: 03/04/2015 04:39    EKG: Orders placed or performed during the hospital encounter of 03/04/15  . ED EKG  . ED EKG  . EKG 12-Lead  . EKG 12-Lead    IMPRESSION AND PLAN: 1. Fall 2. Syncope 3. Decompensated heart failure 4. Lower extremity laceration wound secondary to fall 5. Chronic respiratory failure 6. COPD Treatment plan 1. Diabetes patient with IV Lasix 40 mg every 12 hourly 2. Cycle troponin to rule out ischemia 3. Echocardiogram 4. Carafate ultrasound to rule out obstruction  All the records are reviewed and case discussed with ED provider. Management plans discussed with the patient, family and they are in agreement.  CODE STATUS:DNR    TOTAL  TIME TAKING CARE OF THIS PATIENT: 39 minutes.    Ihor Austin M.D on 03/04/2015 at 6:46 AM  Between 7am to 6pm - Pager - [phone]  After 6pm go to [email or web address] - password [email or web address] [facility]  [facility]  Office  [phone]  CC: Primary care physician; Leanna Sato, MD

## 2015-03-04 NOTE — Care Management (Signed)
Patient admitted from [facility].  Patient may require SNF at time of discharge.  CSW following

## 2015-03-04 NOTE — Progress Notes (Signed)
PT Cancellation Note  Patient Details Name: DAJANA GEHRIG MRN: [id number] DOB: Mar 29, 1920   Cancelled Treatment:    Reason Eval/Treat Not Completed: Other (comment). Pt currently being taken to ultrasound, not available for therapy. Will re-attempt, time permitting.   Shalayah Beagley 03/04/2015, 1:51 PM  Elizabeth Palau, PT, DPT [phone]

## 2015-03-04 NOTE — Progress Notes (Signed)
Patient ID: Karen Bryan, female   DOB: [birth year], 81 y.o.   MRN: [id number] [facility] PROGRESS NOTE  PCP: Leanna Sato, MD  HPI/Subjective: Patient stated that she had a fall because she is weak and she is 88. She is very difficult to communicate with secondary to hard of hearing and poor vision. Even when I was writing questions down for her she was not answering them correctly. She states that she has a weak heart.  Objective: Filed Vitals:   03/04/15 0800 03/04/15 0940  BP: 152/46 176/71  Pulse: 67 69  Temp:  98.1 F (36.7 C)  Resp:  20    Filed Weights   03/04/15 0139 03/04/15 0933  Weight: 62.596 kg (138 lb) 60.283 kg (132 lb 14.4 oz)    ROS: Review of Systems  Unable to perform ROS  secondary to difficulty communicating with her because of poor vision and hearing Exam: Physical Exam  HENT:  Nose: No mucosal edema.  Mouth/Throat: No oropharyngeal exudate or posterior oropharyngeal edema.  Eyes: Conjunctivae and lids are normal. Pupils are equal, round, and reactive to light.  Neck: No JVD present. Carotid bruit is not present. No edema present. No thyroid mass and no thyromegaly present.  Cardiovascular: S1 normal and S2 normal.  Exam reveals no gallop.   Murmur heard.  Systolic murmur is present with a grade of 2/6  Pulses:      Dorsalis pedis pulses are 2+ on the right side, and 2+ on the left side.  Respiratory: No respiratory distress. She has no wheezes. She has no rhonchi. She has rales in the right lower field and the left lower field.  GI: Soft. Bowel sounds are normal. There is no tenderness.  Musculoskeletal:       Right ankle: She exhibits swelling.       Left ankle: She exhibits swelling.  Lymphadenopathy:    She has no cervical adenopathy.  Neurological: She is alert. No cranial nerve deficit.  Skin: Skin is warm. Nails show no clubbing.  Bilateral lower extremity wounds covered with dressing. Required suturing in the ER.   Psychiatric: She has a normal mood and affect.    Data Reviewed: Basic Metabolic Panel:  Recent Labs Lab 03/04/15 0211  NA 140  K 4.0  CL 98*  CO2 34*  GLUCOSE 134*  BUN 29*  CREATININE 0.99  CALCIUM 8.9   CBC:  Recent Labs Lab 03/04/15 0211  WBC 6.1  HGB 10.1*  HCT 31.8*  MCV 95.4  PLT 225   Cardiac Enzymes:  Recent Labs Lab 03/04/15 0211 03/04/15 0947  TROPONINI <0.03 <0.03   BNP (last 3 results)  Recent Labs  08/10/14 1645 08/18/14 0128 09/30/14 1338  BNP 251.0* 252.0* 275.0*    Recent Results (from the past 240 hour(s))  MRSA PCR Screening     Status: None   Collection Time: 03/04/15 11:52 AM  Result Value Ref Range Status   MRSA by PCR NEGATIVE NEGATIVE Final    Comment:        The GeneXpert MRSA Assay (FDA approved for NASAL specimens only), is one component of a comprehensive MRSA colonization surveillance program. It is not intended to diagnose MRSA infection nor to guide or monitor treatment for MRSA infections.      Studies: Ct Head Wo Contrast  03/04/2015  CLINICAL DATA:  Larey Seat.  Lightheadedness. EXAM: CT HEAD WITHOUT CONTRAST TECHNIQUE: Contiguous axial images were obtained from the base of the skull through the vertex without  intravenous contrast. COMPARISON:  09/29/2013 FINDINGS: No intracranial hemorrhage or extra-axial fluid collection. There is moderately severe generalized atrophy. There is hemispheric white matter hypodensity consistent with chronic small vessel disease. There is remote lacunar infarction in the right thalamus. No interval change from 09/29/2013. Bilateral mastoid effusions. Visible paranasal sinuses are clear. Calvarium and skullbase are intact. IMPRESSION: 1. Negative for acute intracranial traumatic injury. There is moderately severe generalized atrophy, chronic small vessel ischemic disease and remote right thalamus lacunar infarction. 2. Bilateral mastoid effusions. These are of indeterminate chronicity but  they are new from 09/29/2013. Electronically Signed   By: Ellery Plunk M.D.   On: 03/04/2015 02:59   US Carotid Bilateral  03/04/2015  CLINICAL DATA:  81 year old female status post fall EXAM: BILATERAL CAROTID DUPLEX ULTRASOUND TECHNIQUE: Wallace Cullens scale imaging, color Doppler and duplex ultrasound were performed of bilateral carotid and vertebral arteries in the neck. COMPARISON:  Head CT 03/04/2015 FINDINGS: Criteria: Quantification of carotid stenosis is based on velocity parameters that correlate the residual internal carotid diameter with NASCET-based stenosis levels, using the diameter of the distal internal carotid lumen as the denominator for stenosis measurement. The following velocity measurements were obtained: RIGHT ICA:  126/19 cm/sec CCA:  86/6 cm/sec SYSTOLIC ICA/CCA RATIO:  1.5 DIASTOLIC ICA/CCA RATIO:  3.1 ECA:  131 cm/sec LEFT ICA:  71/11 cm/sec CCA:  94/9 cm/sec SYSTOLIC ICA/CCA RATIO:  0.8 DIASTOLIC ICA/CCA RATIO:  1.2 ECA:  129 cm/sec RIGHT CAROTID ARTERY: Heterogeneous atherosclerotic plaque in the carotid bifurcation extending into the proximal internal carotid artery. By peak systolic velocity criteria the stenosis falls in the 50- 69% diameter range. RIGHT VERTEBRAL ARTERY:  Patent with normal antegrade flow. LEFT CAROTID ARTERY: Mild focal heterogeneous atherosclerotic plaque in the carotid bifurcation and proximal internal carotid artery. The plaque is irregular. By peak systolic velocity criteria, the estimated stenosis remains less than 50%. LEFT VERTEBRAL ARTERY:  Patent with normal antegrade flow. IMPRESSION: 1. Moderate (50- 69%) stenosis proximal right internal carotid artery secondary to heterogeneous atherosclerotic plaque. 2. Mild (1-49%) stenosis proximal left internal carotid artery secondary to irregular heterogeneous atherosclerotic plaque. 3. Vertebral arteries are patent with normal antegrade flow. Signed, Sterling Big, MD [facility] Electronically Signed   By: Malachy Moan M.D.   On: 03/04/2015 15:22   Dg Chest Port 1 View  03/04/2015  CLINICAL DATA:  Syncope. EXAM: PORTABLE CHEST 1 VIEW COMPARISON:  12/30/2014 FINDINGS: There is unchanged left hemidiaphragm elevation. There is multifocal ground-glass opacity, predominantly central in distribution. There also is moderate vascular prominence and mild interstitial thickening. The findings may represent congestive heart failure. No large effusions. IMPRESSION: Multifocal central and basilar patchy airspace opacities, suspicious for congestive heart failure with alveolar edema. Electronically Signed   By: Ellery Plunk M.D.   On: 03/04/2015 02:55   Dg Hip Unilat With Pelvis 2-3 Views Left  03/04/2015  CLINICAL DATA:  Left hip pain after fall EXAM: DG HIP (WITH OR WITHOUT PELVIS) 2-3V LEFT COMPARISON:  None. FINDINGS: There are intact appearances of the left total hip arthroplasty. No acute fracture or dislocation. Bony pelvis appears intact. Pubic symphysis and sacroiliac joints appear intact. IMPRESSION: Negative for acute fracture or dislocation. Electronically Signed   By: Ellery Plunk M.D.   On: 03/04/2015 04:39    Scheduled Meds: . aspirin EC  81 mg Oral Daily  . carvedilol  3.125 mg Oral BID WC  . enoxaparin (LOVENOX) injection  30 mg Subcutaneous Q24H  .  furosemide  40 mg Intravenous Q12H  . hydroxychloroquine  200 mg Oral Daily  . levothyroxine  50 mcg Oral QAC breakfast  . losartan  50 mg Oral Daily  . PARoxetine  10 mg Oral Daily  . predniSONE  20 mg Oral Daily  . sodium chloride  3 mL Intravenous Q12H    Assessment/Plan:  1. Acute on chronic congestive heart failure. Echocardiogram to determine ejection fraction. Patient on IV Lasix, low-dose Coreg and losartan 2. Fall versus syncope- difficult history because the patient is hard of hearing and cannot see very well. Unclear if this is a syncope or not. 3. Carotid  stenosis right carotid artery. Continue aspirin will need follow-up as outpatient 4. Chronic respiratory failure on oxygen 24 7 5. Hypothyroidism continue levothyroxine 6. Weakness- physical therapy evaluation  Code Status:     Code Status Orders        Start     Ordered   03/04/15 0932  Do not attempt resuscitation (DNR)   Continuous    Question Answer Comment  In the event of cardiac or respiratory ARREST Do not call a "code blue"   In the event of cardiac or respiratory ARREST Do not perform Intubation, CPR, defibrillation or ACLS   In the event of cardiac or respiratory ARREST Use medication by any route, position, wound care, and other measures to relive pain and suffering. May use oxygen, suction and manual treatment of airway obstruction as needed for comfort.      03/04/15 0931     Family Communication: Left message for sun Disposition Plan: To be determined  Time spent: 35 minutes  Alford Highland  [facility]

## 2015-03-04 NOTE — Progress Notes (Signed)
MD notified that UA showed rare bacteria in urine, RN asked about antibiotics. Dr. Renae Gloss to review labwork when he is able.

## 2015-03-04 NOTE — ED Provider Notes (Signed)
[facility] Emergency Department Provider Note  ____________________________________________  Time seen: 2:00 AM  I have reviewed the triage vital signs and the nursing notes.   HISTORY  Chief Complaint Fall      HPI Karen Bryan is a 81 y.o. female presents with unwitnessed fall from [facility] with bilateral lower extremity laceration. Patient states that she does not recall passing out or falling. Patient states she was sitting on the bed and then next 16 oh she was on the floor.     Past Medical History  Diagnosis Date  . COPD (chronic obstructive pulmonary disease) (HCC)   . CHF (congestive heart failure) (HCC)   . Hypertension   . Bilateral pneumonia   . Chronic respiratory failure (HCC)   . Bilateral pleural effusion     Patient Active Problem List   Diagnosis Date Noted  . Cough 10/03/2014  . Dyspnea 10/03/2014  . ILD (interstitial lung disease) (HCC) 10/03/2014  . COPD exacerbation (HCC) 09/26/2014  . Hard of hearing 09/26/2014  . Hypertensive urgency 08/10/2014  . Acute on chronic diastolic heart failure (HCC) 08/10/2014  . Hypertension, essential 08/10/2014  . Rheumatoid arthritis (HCC) 08/10/2014  . COPD (chronic obstructive pulmonary disease) (HCC) 08/10/2014    Past Surgical History  Procedure Laterality Date  . Hip surgery      Current Outpatient Rx  Name  Route  Sig  Dispense  Refill  . azithromycin (ZITHROMAX Z-PAK) 250 MG tablet      Take 2 tablets (500 mg) on  Day 1,  followed by 1 tablet (250 mg) once daily on Days 2 through 5.   6 each   0   . furosemide (LASIX) 20 MG tablet   Oral   Take 60 mg by mouth daily.          . hydroxychloroquine (PLAQUENIL) 200 MG tablet   Oral   Take 200 mg by mouth daily.         Marland Kitchen levothyroxine (SYNTHROID, LEVOTHROID) 50 MCG tablet   Oral   Take 50 mcg by mouth daily before breakfast.         . losartan (COZAAR) 50 MG tablet   Oral    Take 50 mg by mouth daily.         . Menthol, Topical Analgesic, (BENGAY VANISHING SCENT EX)   Apply externally   Apply 1 application topically 4 (four) times daily as needed.          Marland Kitchen PARoxetine (PAXIL) 10 MG tablet   Oral   Take 10 mg by mouth daily.         . predniSONE (DELTASONE) 20 MG tablet   Oral   Take 1 tablet (20 mg total) by mouth daily.   8 tablet   0     Allergies Tramadol  Family History  Problem Relation Age of Onset  . Pneumonia Mother   . Arthritis Father     Social History Social History  Substance Use Topics  . Smoking status: Never Smoker   . Smokeless tobacco: Never Used  . Alcohol Use: No    Review of Systems  Constitutional: Negative for fever. Eyes: Negative for visual changes. ENT: Negative for sore throat. Cardiovascular: Negative for chest pain. Respiratory: Negative for shortness of breath. Gastrointestinal: Negative for abdominal pain, vomiting and diarrhea. Genitourinary: Negative for dysuria. Musculoskeletal: Negative for back pain. Skin: Negative for rash. Positive for bilateral looks really laceration Neurological: Negative for headaches,  focal weakness or numbness.   10-point ROS otherwise negative.  ____________________________________________   PHYSICAL EXAM:  VITAL SIGNS: ED Triage Vitals  Enc Vitals Group     BP 03/04/15 0139 170/52 mmHg     Pulse Rate 03/04/15 0139 73     Resp 03/04/15 0139 18     Temp 03/04/15 0139 98.3 F (36.8 C)     Temp Source 03/04/15 0139 Oral     SpO2 03/04/15 0139 92 %     Weight 03/04/15 0139 138 lb (62.596 kg)     Height 03/04/15 0139 5\' 3"  (1.6 m)     Head Cir --      Peak Flow --      Pain Score 03/04/15 0142 10     Pain Loc --      Pain Edu? --      Excl. in GC? --     Constitutional: Alert and oriented. Well appearing and in no distress. Eyes: Conjunctivae are normal. PERRL. Normal extraocular movements. ENT   Head: Normocephalic and atraumatic.   Nose:  No congestion/rhinnorhea.   Mouth/Throat: Mucous membranes are moist.   Neck: No stridor. Hematological/Lymphatic/Immunilogical: No cervical lymphadenopathy. Cardiovascular: Normal rate, regular rhythm. Normal and symmetric distal pulses are present in all extremities. Grade 2 systolic ejection murmur Respiratory: Normal respiratory effort without tachypnea nor retractions. Breath sounds are clear and equal bilaterally. No wheezes/rales/rhonchi. Gastrointestinal: Soft and nontender. No distention. There is no CVA tenderness. Genitourinary: deferred Musculoskeletal: Nontender with normal range of motion in all extremities. No joint effusions.  No lower extremity tenderness nor edema. Neurologic:  Normal speech and language. No gross focal neurologic deficits are appreciated. Speech is normal.  Skin:  Skin is warm, 3 inch linear left lower extremity laceration, 2 inch right lower extremity linear laceration Psychiatric: Mood and affect are normal. Speech and behavior are normal. Patient exhibits appropriate insight and judgment.  ____________________________________________    LABS (pertinent positives/negatives)  Labs Reviewed  BASIC METABOLIC PANEL - Abnormal; Notable for the following:    Chloride 98 (*)    CO2 34 (*)    Glucose, Bld 134 (*)    BUN 29 (*)    GFR calc non Af Amer 48 (*)    GFR calc Af Amer 55 (*)    All other components within normal limits  CBC - Abnormal; Notable for the following:    RBC 3.34 (*)    Hemoglobin 10.1 (*)    HCT 31.8 (*)    MCHC 31.7 (*)    All other components within normal limits  TROPONIN I  URINALYSIS COMPLETEWITH MICROSCOPIC ([facility] ONLY)     ____________________________________________   EKG   RADIOLOGY  DG HIP UNILAT WITH PELVIS 2-3 VIEWS LEFT (Final result) Result time: 03/04/15 04:39:58   Final result by Rad Results In Interface (03/04/15 04:39:58)   Narrative:   CLINICAL DATA: Left hip pain after fall  EXAM: DG  HIP (WITH OR WITHOUT PELVIS) 2-3V LEFT  COMPARISON: None.  FINDINGS: There are intact appearances of the left total hip arthroplasty. No acute fracture or dislocation. Bony pelvis appears intact. Pubic symphysis and sacroiliac joints appear intact.  IMPRESSION: Negative for acute fracture or dislocation.   Electronically Signed By: 03/06/15 M.D. On: 03/04/2015 04:39          DG Chest Port 1 View (Final result) Result time: 03/04/15 02:55:24   Final result by Rad Results In Interface (03/04/15 02:55:24)   Narrative:   CLINICAL DATA: Syncope.  EXAM: PORTABLE CHEST 1 VIEW  COMPARISON: 12/30/2014  FINDINGS: There is unchanged left hemidiaphragm elevation. There is multifocal ground-glass opacity, predominantly central in distribution. There also is moderate vascular prominence and mild interstitial thickening. The findings may represent congestive heart failure. No large effusions.  IMPRESSION: Multifocal central and basilar patchy airspace opacities, suspicious for congestive heart failure with alveolar edema.   Electronically Signed By: Ellery Plunk M.D. On: 03/04/2015 02:55          CT Head Wo Contrast (Final result) Result time: 03/04/15 02:59:04   Final result by Rad Results In Interface (03/04/15 02:59:04)   Narrative:   CLINICAL DATA: Larey Seat. Lightheadedness.  EXAM: CT HEAD WITHOUT CONTRAST  TECHNIQUE: Contiguous axial images were obtained from the base of the skull through the vertex without intravenous contrast.  COMPARISON: 09/29/2013  FINDINGS: No intracranial hemorrhage or extra-axial fluid collection. There is moderately severe generalized atrophy. There is hemispheric white matter hypodensity consistent with chronic small vessel disease. There is remote lacunar infarction in the right thalamus. No interval change from 09/29/2013.  Bilateral mastoid effusions. Visible paranasal sinuses are  clear. Calvarium and skullbase are intact.  IMPRESSION: 1. Negative for acute intracranial traumatic injury. There is moderately severe generalized atrophy, chronic small vessel ischemic disease and remote right thalamus lacunar infarction. 2. Bilateral mastoid effusions. These are of indeterminate chronicity but they are new from 09/29/2013.   Electronically Signed By: Ellery Plunk M.D. On: 03/04/2015 02:59       Procedure note: LACERATION REPAIR Performed by: Darci Current Authorized by: Darci Current Consent: Verbal consent obtained. Risks and benefits: risks, benefits and alternatives were discussed Consent given by: patient Patient identity confirmed: provided demographic data Prepped and Draped in normal sterile fashion Wound explored  Laceration Location: bilateral lower leg Laceration Length: 3 inches right leg and oriented his left leg No Foreign Bodies seen or palpated  Anesthesia: local infiltration  Local anesthetic: lidocaine 1% with epinephrine   Anesthetic total: 34ml  Irrigation method: syringe Amount of cleaning: standard  Skin closure: 5 O nylon  Number of sutures: 9 right leg and 15 left leg Technique: simple interrupted  Patient tolerance: Patient tolerated the procedure well with no immediate complications.    INITIAL IMPRESSION / ASSESSMENT AND PLAN / ED COURSE  Pertinent labs & imaging results that were available during my care of the patient were reviewed by me and considered in my medical decision making (see chart for details).  Given possibility syncopal episode, pulmonary edema in the setting of a systolic ejection murmur patient will be admitted to the hospital  ____________________________________________   FINAL CLINICAL IMPRESSION(S) / ED DIAGNOSES  Final diagnoses:  Syncope, unspecified syncope type  Acute pulmonary edema (HCC)      Darci Current, MD 03/04/15 [phone]

## 2015-03-04 NOTE — Progress Notes (Signed)
*  PRELIMINARY RESULTS* Echocardiogram 2D Echocardiogram has been performed.  Karen Bryan Hege 03/04/2015, 2:01 PM

## 2015-03-04 NOTE — Progress Notes (Addendum)
Pt. admitted to unit from ED, (report from [facility]), [phone]. Oriented to room, call bell, Ascom phones and staff. Pt is very HOH, using word doc on computer to type and communicate with patient. Bed in low position. Fall safety plan reviewed, yellow non-skid socks in place, bed alarm on. Full assessment to Epic, skin assessed with [facility] RN. Telemetry box applied [id number] and verified with tele clerk. Will continue to monitor.

## 2015-03-05 LAB — BASIC METABOLIC PANEL
ANION GAP: 5 (ref 5–15)
BUN: 31 mg/dL — ABNORMAL HIGH (ref 6–20)
CO2: 36 mmol/L — AB (ref 22–32)
Calcium: 8.6 mg/dL — ABNORMAL LOW (ref 8.9–10.3)
Chloride: 96 mmol/L — ABNORMAL LOW (ref 101–111)
Creatinine, Ser: 0.9 mg/dL (ref 0.44–1.00)
GFR, EST NON AFRICAN AMERICAN: 53 mL/min — AB (ref >60)
GLUCOSE: 118 mg/dL — AB (ref 65–99)
POTASSIUM: 4 mmol/L (ref 3.5–5.1)
Sodium: 137 mmol/L (ref 135–145)

## 2015-03-05 MED ORDER — FUROSEMIDE 10 MG/ML IJ SOLN
40.0000 mg | Freq: Once | INTRAMUSCULAR | Status: AC
  Administered 2015-03-05: 40 mg via INTRAVENOUS
  Filled 2015-03-05: qty 4

## 2015-03-05 MED ORDER — FUROSEMIDE 10 MG/ML IJ SOLN: 40.0000 mg | Freq: Two times a day (BID) | INTRAMUSCULAR | Status: DC

## 2015-03-05 MED ORDER — FUROSEMIDE 40 MG PO TABS
60.0000 mg | ORAL_TABLET | Freq: Every day | ORAL | Status: DC
  Administered 2015-03-06: 60 mg via ORAL
  Filled 2015-03-05: qty 1

## 2015-03-05 MED ORDER — AMLODIPINE BESYLATE 5 MG PO TABS
2.5000 mg | ORAL_TABLET | Freq: Every day | ORAL | Status: DC
  Administered 2015-03-05 – 2015-03-06 (×2): 2.5 mg via ORAL
  Filled 2015-03-05 (×3): qty 1

## 2015-03-05 MED ORDER — ENSURE ENLIVE PO LIQD
237.0000 mL | Freq: Every day | ORAL | Status: DC
  Administered 2015-03-05: 237 mL via ORAL

## 2015-03-05 MED ORDER — LORAZEPAM 0.5 MG PO TABS
0.5000 mg | ORAL_TABLET | Freq: Once | ORAL | Status: AC
  Administered 2015-03-05: 0.5 mg via ORAL
  Filled 2015-03-05: qty 1

## 2015-03-05 NOTE — Progress Notes (Signed)
Initial Nutrition Assessment    INTERVENTION:   Meals and Snacks: Cater to patient preferences Medical Food Supplement Therapy: recommend Ensure Enlive po daily, each supplement provides 350 kcal and 20 grams of protein  NUTRITION DIAGNOSIS:   No nutrition diagnosis at this time  GOAL:   Patient will meet greater than or equal to 90% of their needs  MONITOR:    (Energy Intake, Anthropometrics, Digestive System, Electrolyte/Renal Profile)  REASON FOR ASSESSMENT:   Malnutrition Screening Tool    ASSESSMENT:    Pt admitted with fall, weakness, acute on chronic CHF; pt very hard of hearing  Past Medical History  Diagnosis Date  . COPD (chronic obstructive pulmonary disease) (HCC)   . CHF (congestive heart failure) (HCC)   . Hypertension   . Bilateral pneumonia   . Chronic respiratory failure (HCC)   . Bilateral pleural effusion      Diet Order:  Diet 2 gram sodium Room service appropriate?: Yes; Fluid consistency:: Thin   Energy Intake: pt ate 100% at breakfast this AM, 25% at lunch and 25% at dinner yesterday; appetite good at present  Skin:   (skin tear, no pressure ulcers)  Electrolyte and Renal Profile:  Recent Labs Lab 03/04/15 0211 03/05/15 0500  BUN 29* 31*  CREATININE 0.99 0.90  NA 140 137  K 4.0 4.0   Glucose Profile: No results for input(s): GLUCAP in the last 72 hours. Nutritional Anemia Profile:  CBC Latest Ref Rng 03/04/2015 12/30/2014 09/30/2014  WBC 3.6 - 11.0 K/uL 6.1 4.9 6.7  Hemoglobin 12.0 - 16.0 g/dL 10.1(L) 10.0(L) 11.9(L)  Hematocrit 35.0 - 47.0 % 31.8(L) 31.2(L) 36.5  Platelets 150 - 440 K/uL 225 189 203    Protein Profile: No results for input(s): ALBUMIN in the last 168 hours. Meds: lasix   Height:   Ht Readings from Last 1 Encounters:  03/04/15 5' (1.524 m)    Weight: weight relatively stable per weight encounters over past 6 months or so, 3.7% wt change (down) in past 2 months  Wt Readings from Last 1 Encounters:   03/05/15 130 lb 14.4 oz (59.376 kg)    Wt Readings from Last 10 Encounters:  03/05/15 130 lb 14.4 oz (59.376 kg)  01/03/15 135 lb 12 oz (61.576 kg)  12/30/14 140 lb 10.5 oz (63.8 kg)  10/28/14 130 lb (58.968 kg)  10/03/14 128 lb (58.06 kg)  09/30/14 128 lb (58.06 kg)  09/26/14 126 lb (57.153 kg)  08/18/14 138 lb (62.596 kg)  08/17/14 166 lb 5 oz (75.439 kg)  08/14/14 126 lb 3.2 oz (57.244 kg)    BMI:  Body mass index is 25.56 kg/(m^2).  LOW Care Level  Romelle Starcher MS, [place], LDN [phone] Pager

## 2015-03-05 NOTE — Progress Notes (Signed)
Patient ID: Karen Bryan, female   DOB: [birth year], 81 y.o.   MRN: [id number] [facility] PROGRESS NOTE  PCP: Leanna Sato, MD  HPI/Subjective: Patient feeling a little bit better today. Breathing a little bit better.  Objective: Filed Vitals:   03/05/15 0441 03/05/15 1355  BP: 168/50 157/50  Pulse: 63 58  Temp: 98.3 F (36.8 C) 97.8 F (36.6 C)  Resp: 18 18    Filed Weights   03/04/15 0139 03/04/15 0933 03/05/15 0642  Weight: 62.596 kg (138 lb) 60.283 kg (132 lb 14.4 oz) 59.376 kg (130 lb 14.4 oz)    ROS: Review of Systems  Unable to perform ROS Respiratory: Positive for cough and sputum production.   Cardiovascular: Negative for chest pain.   Limited secondary to heart hearing Exam: Physical Exam  HENT:  Nose: No mucosal edema.  Mouth/Throat: No oropharyngeal exudate or posterior oropharyngeal edema.  Eyes: Conjunctivae and lids are normal. Pupils are equal, round, and reactive to light.  Neck: No JVD present. Carotid bruit is not present. No edema present. No thyroid mass and no thyromegaly present.  Cardiovascular: S1 normal and S2 normal.  Exam reveals no gallop.   Murmur heard.  Systolic murmur is present with a grade of 2/6  Pulses:      Dorsalis pedis pulses are 2+ on the right side, and 2+ on the left side.  Respiratory: No respiratory distress. She has no wheezes. She has no rhonchi. She has rales in the right lower field and the left lower field.  GI: Soft. Bowel sounds are normal. There is no tenderness.  Musculoskeletal:       Right ankle: She exhibits swelling.       Left ankle: She exhibits swelling.  Lymphadenopathy:    She has no cervical adenopathy.  Neurological: She is alert. No cranial nerve deficit.  Skin: Skin is warm. Nails show no clubbing.  Bilateral lower extremity wounds covered with dressing. Required suturing in the ER.  Psychiatric: She has a normal mood and affect.    Data Reviewed: Basic Metabolic  Panel:  Recent Labs Lab 03/04/15 0211 03/05/15 0500  NA 140 137  K 4.0 4.0  CL 98* 96*  CO2 34* 36*  GLUCOSE 134* 118*  BUN 29* 31*  CREATININE 0.99 0.90  CALCIUM 8.9 8.6*   CBC:  Recent Labs Lab 03/04/15 0211  WBC 6.1  HGB 10.1*  HCT 31.8*  MCV 95.4  PLT 225   Cardiac Enzymes:  Recent Labs Lab 03/04/15 0211 03/04/15 0947 03/04/15 1528  TROPONINI <0.03 <0.03 <0.03   BNP (last 3 results)  Recent Labs  08/10/14 1645 08/18/14 0128 09/30/14 1338  BNP 251.0* 252.0* 275.0*    Recent Results (from the past 240 hour(s))  MRSA PCR Screening     Status: None   Collection Time: 03/04/15 11:52 AM  Result Value Ref Range Status   MRSA by PCR NEGATIVE NEGATIVE Final    Comment:        The GeneXpert MRSA Assay (FDA approved for NASAL specimens only), is one component of a comprehensive MRSA colonization surveillance program. It is not intended to diagnose MRSA infection nor to guide or monitor treatment for MRSA infections.      Studies: Ct Head Wo Contrast  03/04/2015  CLINICAL DATA:  Larey Seat.  Lightheadedness. EXAM: CT HEAD WITHOUT CONTRAST TECHNIQUE: Contiguous axial images were obtained from the base of the skull through the vertex without intravenous contrast. COMPARISON:  09/29/2013 FINDINGS: No intracranial hemorrhage  or extra-axial fluid collection. There is moderately severe generalized atrophy. There is hemispheric white matter hypodensity consistent with chronic small vessel disease. There is remote lacunar infarction in the right thalamus. No interval change from 09/29/2013. Bilateral mastoid effusions. Visible paranasal sinuses are clear. Calvarium and skullbase are intact. IMPRESSION: 1. Negative for acute intracranial traumatic injury. There is moderately severe generalized atrophy, chronic small vessel ischemic disease and remote right thalamus lacunar infarction. 2. Bilateral mastoid effusions. These are of indeterminate chronicity but they are new  from 09/29/2013. Electronically Signed   By: Ellery Plunk M.D.   On: 03/04/2015 02:59   US Carotid Bilateral  03/04/2015  CLINICAL DATA:  81 year old female status post fall EXAM: BILATERAL CAROTID DUPLEX ULTRASOUND TECHNIQUE: Wallace Cullens scale imaging, color Doppler and duplex ultrasound were performed of bilateral carotid and vertebral arteries in the neck. COMPARISON:  Head CT 03/04/2015 FINDINGS: Criteria: Quantification of carotid stenosis is based on velocity parameters that correlate the residual internal carotid diameter with NASCET-based stenosis levels, using the diameter of the distal internal carotid lumen as the denominator for stenosis measurement. The following velocity measurements were obtained: RIGHT ICA:  126/19 cm/sec CCA:  86/6 cm/sec SYSTOLIC ICA/CCA RATIO:  1.5 DIASTOLIC ICA/CCA RATIO:  3.1 ECA:  131 cm/sec LEFT ICA:  71/11 cm/sec CCA:  94/9 cm/sec SYSTOLIC ICA/CCA RATIO:  0.8 DIASTOLIC ICA/CCA RATIO:  1.2 ECA:  129 cm/sec RIGHT CAROTID ARTERY: Heterogeneous atherosclerotic plaque in the carotid bifurcation extending into the proximal internal carotid artery. By peak systolic velocity criteria the stenosis falls in the 50- 69% diameter range. RIGHT VERTEBRAL ARTERY:  Patent with normal antegrade flow. LEFT CAROTID ARTERY: Mild focal heterogeneous atherosclerotic plaque in the carotid bifurcation and proximal internal carotid artery. The plaque is irregular. By peak systolic velocity criteria, the estimated stenosis remains less than 50%. LEFT VERTEBRAL ARTERY:  Patent with normal antegrade flow. IMPRESSION: 1. Moderate (50- 69%) stenosis proximal right internal carotid artery secondary to heterogeneous atherosclerotic plaque. 2. Mild (1-49%) stenosis proximal left internal carotid artery secondary to irregular heterogeneous atherosclerotic plaque. 3. Vertebral arteries are patent with normal antegrade flow. Signed, Sterling Big, MD [facility]  [facility] Electronically Signed   By: Malachy Moan M.D.   On: 03/04/2015 15:22   Dg Chest Port 1 View  03/04/2015  CLINICAL DATA:  Syncope. EXAM: PORTABLE CHEST 1 VIEW COMPARISON:  12/30/2014 FINDINGS: There is unchanged left hemidiaphragm elevation. There is multifocal ground-glass opacity, predominantly central in distribution. There also is moderate vascular prominence and mild interstitial thickening. The findings may represent congestive heart failure. No large effusions. IMPRESSION: Multifocal central and basilar patchy airspace opacities, suspicious for congestive heart failure with alveolar edema. Electronically Signed   By: Ellery Plunk M.D.   On: 03/04/2015 02:55   Dg Hip Unilat With Pelvis 2-3 Views Left  03/04/2015  CLINICAL DATA:  Left hip pain after fall EXAM: DG HIP (WITH OR WITHOUT PELVIS) 2-3V LEFT COMPARISON:  None. FINDINGS: There are intact appearances of the left total hip arthroplasty. No acute fracture or dislocation. Bony pelvis appears intact. Pubic symphysis and sacroiliac joints appear intact. IMPRESSION: Negative for acute fracture or dislocation. Electronically Signed   By: Ellery Plunk M.D.   On: 03/04/2015 04:39    Scheduled Meds: . amLODipine  2.5 mg Oral Daily  . aspirin EC  81 mg Oral Daily  . carvedilol  3.125 mg Oral BID WC  . enoxaparin (LOVENOX) injection  30 mg Subcutaneous Q24H  . feeding  supplement (ENSURE ENLIVE)  237 mL Oral Q1500  . furosemide  40 mg Intravenous BID  . hydroxychloroquine  200 mg Oral Daily  . levothyroxine  50 mcg Oral QAC breakfast  . losartan  50 mg Oral Daily  . PARoxetine  10 mg Oral Daily  . sodium chloride  3 mL Intravenous Q12H    Assessment/Plan:  1. Acute on chronic congestive heart failure. Echocardiogram to determine ejection fraction. Continue IV Lasix today and tomorrow morning, low-dose Coreg and losartan 2. Fall versus syncope- difficult history because the patient is hard of hearing and  cannot see very well. Unclear if this is a syncope or not. 3. Carotid stenosis right carotid artery. Continue aspirin will need follow-up as outpatient 4. Chronic respiratory failure on oxygen 24/7 5. Hypothyroidism continue levothyroxine 6. Weakness- physical therapy evaluation recommended nursing home facility will take her back 7. And speak with the son, the patient has a doctor there is appointment with Dr. Lavenia Atlas for joint injection. They would like to take her to this appointment. 8. Echocardiogram could not rule out vegetation versus calcification on the cordi. Since the patient is afebrile and has a normal white count I don't think I will work this up further. 9. Sutures on the lower extremity- need to come out in 7-10 days  Code Status:     Code Status Orders        Start     Ordered   03/04/15 0932  Do not attempt resuscitation (DNR)   Continuous    Question Answer Comment  In the event of cardiac or respiratory ARREST Do not call a "code blue"   In the event of cardiac or respiratory ARREST Do not perform Intubation, CPR, defibrillation or ACLS   In the event of cardiac or respiratory ARREST Use medication by any route, position, wound care, and other measures to relive pain and suffering. May use oxygen, suction and manual treatment of airway obstruction as needed for comfort.      03/04/15 0931     Family Communication: Spoke with son on the phone Disposition Plan: Likely back to her facility tomorrow  Time spent: 22 minutes  Alford Highland  [facility]

## 2015-03-05 NOTE — Evaluation (Signed)
Physical Therapy Evaluation Patient Details Name: Karen Bryan MRN: [id number] DOB: [birth year] Today's Date: 03/05/2015   History of Present Illness  Pt is admitted for fall at ALF. Pt with history CHF, chronic respiratory failure, and COPD.   Clinical Impression  Pt is a pleasant 81 year old female who was admitted for fall at ALF.  Most of history obtained from chart review as pt communicates best with written information. Pt performs bed mobility with cga and transfers with min assist and RW. Pt unable to ambulate at this time secondary to unable to successfully communicate.  Pt demonstrates deficits with strength/endurance/mobility. Would benefit from skilled PT to address above deficits and promote optimal return to PLOF      Follow Up Recommendations SNF (until further mobility assessment complete)    Equipment Recommendations  None recommended by PT    Recommendations for Other Services       Precautions / Restrictions Precautions Precautions: Fall Restrictions Weight Bearing Restrictions: No      Mobility  Bed Mobility Overal bed mobility: Needs Assistance             General bed mobility comments: Pt found sitting at EOB, pt assisted to supine with cga and then + 2 and min assist for scooting up towards HOB.   Transfers Overall transfer level: Needs assistance Equipment used: Rolling [place] (2 wheeled) Transfers: Sit to/from Stand Sit to Stand: Min assist         General transfer comment: sit<>Stand with rw and min assist. Pt with forward flexed posture. Further assessment difficult to perform secondary to communication difficulties.   Ambulation/Gait             General Gait Details: tried to relay to patient to walk, however pt is very HOH and pt could not understand to walk  Stairs            Wheelchair Mobility    Modified Rankin (Stroke Patients Only)       Balance Overall balance assessment: History of Falls;Needs  assistance Sitting-balance support: Feet unsupported;Bilateral upper extremity supported Sitting balance-Leahy Scale: Good     Standing balance support: Bilateral upper extremity supported Standing balance-Leahy Scale: Fair                               Pertinent Vitals/Pain Pain Assessment: No/denies pain    Home Living Family/patient expects to be discharged to:: Skilled nursing facility                      Prior Function Level of Independence: Independent with assistive device(s) (per chart review, was fairly mobile at ALF)               Hand Dominance        Extremity/Trunk Assessment   Upper Extremity Assessment: Overall WFL for tasks assessed           Lower Extremity Assessment: Generalized weakness (grossly 3/5; difficult to assess secondary to [facility])         Communication   Communication: HOH (need to write on paper to communicate)  Cognition Arousal/Alertness: Awake/alert Behavior During Therapy: WFL for tasks assessed/performed Overall Cognitive Status: Within Functional Limits for tasks assessed                      General Comments General comments (skin integrity, edema, etc.): pt eating breakfast at time of evaluation  Exercises        Assessment/Plan    PT Assessment Patient needs continued PT services  PT Diagnosis Difficulty walking;Generalized weakness   PT Problem List Decreased strength;Decreased activity tolerance;Decreased knowledge of use of DME  PT Treatment Interventions Gait training;Therapeutic exercise   PT Goals (Current goals can be found in the Care Plan section) Acute Rehab PT Goals Patient Stated Goal: to get in bed PT Goal Formulation: Patient unable to participate in goal setting Time For Goal Achievement: 03/19/15 Potential to Achieve Goals: Good    Frequency Min 2X/week   Barriers to discharge        Co-evaluation               End of Session Equipment Utilized  During Treatment: Gait belt;Oxygen Activity Tolerance: Patient tolerated treatment well Patient left: in bed;with bed alarm set Nurse Communication: Mobility status         Time: 6945-0388 PT Time Calculation (min) (ACUTE ONLY): 10 min   Charges:   PT Evaluation $Initial PT Evaluation Tier I: 1 Procedure     PT G Codes:        Andriy Sherk March 23, 2015, 11:27 AM  Elizabeth Palau, PT, DPT [phone]

## 2015-03-05 NOTE — Clinical Social Work Note (Addendum)
Clinical Social Work Assessment  Patient Details  Name: Karen Bryan MRN: [id number] Date of Birth: Mar 10, 1920  Date of referral:  03/05/15               Reason for consult:  Facility Placement (From [facility])                Permission sought to share information with:  Facility Medical sales representative, Family Supports (son and daughter) Permission granted to share information::  Yes, Verbal Permission Granted  Name::        Agency::     Relationship::     Contact Information:     Housing/Transportation Living arrangements for the past 2 months:  Assisted Dealer of Information:  Patient, Adult Children, Medical Team, Facility Patient Interpreter Needed:  None Criminal Activity/Legal Involvement Pertinent to Current Situation/Hospitalization:  No - Comment as needed Significant Relationships:  Adult Children, Community Support Lives with:  Facility Resident Do you feel safe going back to the place where you live?  Yes Need for family participation in patient care:  Yes (Comment)  Care giving concerns:  None at this time   Office manager / plan:  Visual merchandiser (CSW) consult, patient is from ALF with the possibility of needing SNF for STR .   Patient was alert and oriented min. engaged in conversation with CSW due to patient is hard of hearing.  (Information provided by her ALF).  CSW introduced self and explained role of CSW department Patient currently lives at [facility] where she has lived for the past year. .   Patient's support system is her son Karen Bryan [phone] and Karen Bryan [phone]  Patient ok for CSW to talk with her son and dau, however unable to reach them by phone.  Call to [facility] spoke with Karen Bryan the Coordinator.  Patient's RN Karen Bryan came to visit patient yesterday.  They are ok with taking patient back at her current level as per Karen Bryan " we are already doing everything for her".  Per Karen Bryan she spoke with patient's  family.  They are out of town but will come to the hospital today as soon they get in town.      CSW will complete FL2, and talk with family about patient going to SNF or returning to ALF with home health.  [facility] for home health needs.     Employment status:  Disabled (Comment on whether or not currently receiving Disability) Insurance information:  Teacher, English as a foreign language, Medicaid In [place] PT Recommendations:  Skilled Nursing Facility Information / Referral to community resources:  Skilled Nursing Facility (possible return to ALF)  Patient/Family's Response to care:  Patient was pleasant, directed CSW to talk with her son & daughter.  Patient/Family's Understanding of and Emotional Response to Diagnosis, Current Treatment, and Prognosis:  CSW will discuss disposition options with family per patient's request when they arrive.  Emotional Assessment Appearance:  Appears stated age Attitude/Demeanor/Rapport:    Affect (typically observed):  Accepting, Pleasant, Calm Orientation:  Oriented to Self, Oriented to Place Alcohol / Substance use:  Never Used Psych involvement (Current and /or in the community):  No (Comment)  Discharge Needs  Concerns to be addressed:  Discharge Planning Concerns, Care Coordination Readmission within the last 30 days:  No Current discharge risk:  Chronically ill, Dependent with Mobility Barriers to Discharge:  Continued Medical Work up   [employer], LCSW 03/05/2015, 9:59 AM

## 2015-03-05 NOTE — Progress Notes (Signed)
Call to patient's son [name] [phone] he is out of town and will return tomorrow.  Per son he is in agreement with patient returning to ALF with home health PT.   His concern is that patient has an appointment tomorrow 10:45 at [facility] for a cortisone shot. Per son if she misses this appointment it will be 3 months before she can get another one.  States he has spoken to patient's RN and asked to talk with MD to share his concerns.  Wanted to see if it was possible for patient to get the shot while in the hospital.  CSW contact RN reference follow up on patient's son request.   CSW will continue to follow patient and assist with discharge back to [facility]  Sammuel Hines. LCSWA, MSW Clinical Social Work Department 12:38 PM

## 2015-03-05 NOTE — [place] FL2 (Signed)
[place] MEDICAID FL2 LEVEL OF CARE SCREENING TOOL     IDENTIFICATION  Patient Name: Karen Bryan Birthdate: 04-27-1919 Sex: female Admission Date (Current Location): 03/04/2015  [facility] and [place] Number:  Air cabin crew)  ([id number]) Facility and Address:  [facility], [address], [place], [place] [address]      Provider Number: [id number]  Attending Physician Name and Address:  Alford Highland, MD  Relative Name and Phone Number:       Current Level of Care: Hospital Recommended Level of Care: Assisted Living Facility Prior Approval Number:    Date Approved/Denied:   PASRR Number:    Discharge Plan: Domiciliary (Rest home)    Current Diagnoses: Patient Active Problem List   Diagnosis Date Noted  . Fall 03/04/2015  . Fluid excess 03/04/2015  . CHF (congestive heart failure) (HCC) 03/04/2015  . Cough 10/03/2014  . Dyspnea 10/03/2014  . ILD (interstitial lung disease) (HCC) 10/03/2014  . COPD exacerbation (HCC) 09/26/2014  . Hard of hearing 09/26/2014  . Hypertensive urgency 08/10/2014  . Acute on chronic diastolic heart failure (HCC) 08/10/2014  . Hypertension, essential 08/10/2014  . Rheumatoid arthritis (HCC) 08/10/2014  . COPD (chronic obstructive pulmonary disease) (HCC) 08/10/2014    Orientation ACTIVITIES/SOCIAL BLADDER RESPIRATION    Self, Place, Situation    Incontinent (at times) O2 (As needed) (2L)  BEHAVIORAL SYMPTOMS/MOOD NEUROLOGICAL BOWEL NUTRITION STATUS      Continent Diet (no sodium added)  PHYSICIAN VISITS COMMUNICATION OF NEEDS Height & Weight Skin    Verbally 5' (152.4 cm) 138 lbs. Skin abrasions, Other (Comment) (stitches on R & L leg)          AMBULATORY STATUS RESPIRATION    Assist extensive O2 (As needed) (2L)      Personal Care Assistance Level of Assistance  Bathing, Feeding, Dressing Bathing Assistance: Maximum assistance Feeding assistance: Limited assistance Dressing Assistance: Maximum  assistance      Functional Limitations Info  Sight, Hearing, Speech Sight Info: Adequate Hearing Info: Impaired Speech Info: Adequate       SPECIAL CARE FACTORS FREQUENCY  PT (By licensed PT)                   Additional Factors Info  Code Status, Allergies Code Status Info:  (DNR) Allergies Info:  (Tramadol)           Current Medications (03/05/2015):  This is the current hospital active medication list Current Facility-Administered Medications  Medication Dose Route Frequency Provider Last Rate Last Dose  . 0.9 %  sodium chloride infusion  250 mL Intravenous PRN Ihor Austin, MD      . acetaminophen (TYLENOL) tablet 650 mg  650 mg Oral Q4H PRN Ihor Austin, MD      . aspirin EC tablet 81 mg  81 mg Oral Daily Ihor Austin, MD   81 mg at 03/05/15 6256  . carvedilol (COREG) tablet 3.125 mg  3.125 mg Oral BID WC Ihor Austin, MD   3.125 mg at 03/05/15 3893  . enoxaparin (LOVENOX) injection 30 mg  30 mg Subcutaneous Q24H Pavan Pyreddy, MD   30 mg at 03/04/15 2100  . furosemide (LASIX) injection 20 mg  20 mg Intravenous BID Alford Highland, MD   20 mg at 03/05/15 7342  . hydroxychloroquine (PLAQUENIL) tablet 200 mg  200 mg Oral Daily Ihor Austin, MD   200 mg at 03/05/15 8768  . levothyroxine (SYNTHROID, LEVOTHROID) tablet 50 mcg  50 mcg Oral QAC breakfast Ihor Austin, MD  50 mcg at 03/05/15 0932  . losartan (COZAAR) tablet 50 mg  50 mg Oral Daily Ihor Austin, MD   50 mg at 03/05/15 6712  . ondansetron (ZOFRAN) injection 4 mg  4 mg Intravenous Q6H PRN Pavan Pyreddy, MD      . PARoxetine (PAXIL) tablet 10 mg  10 mg Oral Daily Ihor Austin, MD   10 mg at 03/05/15 4580  . sodium chloride 0.9 % injection 3 mL  3 mL Intravenous Q12H Ihor Austin, MD   3 mL at 03/05/15 9983  . sodium chloride 0.9 % injection 3 mL  3 mL Intravenous PRN Ihor Austin, MD         Discharge Medications: Please see discharge summary for a list of discharge medications.  Relevant  Imaging Results:  Relevant Lab Results:  Recent Labs    Additional Information  ([id number])  Soundra Pilon, LCSW

## 2015-03-05 NOTE — Care Management (Signed)
Physical therapy has initially recommended skilled nursing but discussed that patient's participation may be related to hearing deficits.  She does not have her hearing aids.  CSW has spoken with assisted living staff and informed that facility has relayed that patient can return to facility.  Anticipating home health nursing and physical therapy.  Patient has chronic home 02.

## 2015-03-05 NOTE — Progress Notes (Signed)
Pt son spoke with RN this AM with concerns regarding her scheduled cortisone shot tomorrow. Per son, if she misses tomorrow it will be 73mo before she can have her next one. Son requesting an update from physician to address this issue. RN took patients contact number and gave to Dr. Renae Gloss who will call son when he is able.

## 2015-03-05 NOTE — Consult Note (Signed)
WOC wound consult note Reason for Consult:Patient had fall in SNF with laceration to bilateral lower legs, pretibial area.  Sutures were applied in the ED.  Request for topical therapy.  Wound type:Trauma  Pressure Ulcer POA: N/A Measurement:6 cm linear abrasions, sutured to bilateral lower legs.  Periwound edema and erythema present.  Ecchymosis to bilateral lower legs.   Wound MWU:[id number] Drainage (amount, consistency, odor) Minimal serous weeping Periwound:Erythema, bruising and edema Dressing procedure/placement/frequency:Keep suture line clean and dry.  Cover with dry dressing.  Change daily.  Will not follow at this time.  Please re-consult if needed.  Maple Hudson RN BSN CWON Pager [phone]

## 2015-03-06 LAB — BASIC METABOLIC PANEL
Anion gap: 6 (ref 5–15)
BUN: 38 mg/dL — ABNORMAL HIGH (ref 6–20)
CALCIUM: 8.7 mg/dL — AB (ref 8.9–10.3)
CHLORIDE: 95 mmol/L — AB (ref 101–111)
CO2: 35 mmol/L — ABNORMAL HIGH (ref 22–32)
CREATININE: 1.09 mg/dL — AB (ref 0.44–1.00)
GFR calc Af Amer: 49 mL/min — ABNORMAL LOW (ref >60)
GFR calc non Af Amer: 42 mL/min — ABNORMAL LOW (ref >60)
Glucose, Bld: 110 mg/dL — ABNORMAL HIGH (ref 65–99)
Potassium: 4.2 mmol/L (ref 3.5–5.1)
SODIUM: 136 mmol/L (ref 135–145)

## 2015-03-06 NOTE — Care Management (Signed)
Referral to [facility] with [facility] for nursing and physical therapy. Patient to return to [facility]. Attending left message for son informing of discharge.  There was question regarding whether patient will be able to keep appointment with [facility] today at 10:30.  Informed attending this would depend on whether facility transport can accommodate.  Patient will have to have portable 02 tank.

## 2015-03-06 NOTE — Discharge Summary (Signed)
[facility] [place] at [facility]   PATIENT NAME: Karen Bryan    MR#:  [id number]  DATE OF BIRTH:  Aug 30, 1919  DATE OF ADMISSION:  03/04/2015 ADMITTING PHYSICIAN: Alford Highland, MD  DATE OF DISCHARGE: 03/06/2015  PRIMARY CARE PHYSICIAN: Leanna Sato, MD    ADMISSION DIAGNOSIS:  CHF (congestive heart failure) (HCC) [I50.9] Acute pulmonary edema (HCC) [J81.0] Fall [W19.XXXA] Syncope, unspecified syncope type [R55]  DISCHARGE DIAGNOSIS:  Principal Problem:   Fall Active Problems:   Fluid excess   CHF (congestive heart failure) (HCC)   SECONDARY DIAGNOSIS:   Past Medical History  Diagnosis Date  . COPD (chronic obstructive pulmonary disease) (HCC)   . CHF (congestive heart failure) (HCC)   . Hypertension   . Bilateral pneumonia   . Chronic respiratory failure (HCC)   . Bilateral pleural effusion     HOSPITAL COURSE:   1. Acute on chronic diastolic congestive heart failure- the patient was diuresis with IV Lasix during the hospital course and switched over to her usual regimen upon discharge back to her facility. Patient not complaining of any shortness of breath. At the right base does have a little rhonchi. 2. Chronic respiratory failure on oxygen 24 7 3. Fall versus syncope- unclear etiology of her fall. The patient states that she is old and doesn't walk well. 4. Laceration bilateral lower extremities requiring suturing- wound looks good. Sutures must be removed in 7 days. Can either do this with home health or primary care physician. 5. Essential hypertension- systolic value is high and diastolic value is low. I'd rather have her blood pressure little bit higher so that when she stands up and walks it doesn't drop down too much. 6. Hypothyroidism unspecified- continue levothyroxine 7. Arthritis-Patient follows with Dr. Lavenia Atlas and has an appointment today at 10:30. Hopefully we can get the patient over there. Logistics of patient  being on oxygen may be the limiting factor. Unable to contact son this morning at the time of discharge. 8. Carotid stenosis on sonogram- needs to be followed up yearly. With falls I'm trying to keep the patient off aspirin at this time. 9. Echocardiogram- the cardiologist read it as he could not rule out a vegetation but it's likely calcification on the cordae tendonae. Without fever or white count I will not work this up further.  DISCHARGE CONDITIONS:   Satisfactory  CONSULTS OBTAINED:  None  DRUG ALLERGIES:   Allergies  Allergen Reactions  . Tramadol Nausea Only    DISCHARGE MEDICATIONS:   Current Discharge Medication List    CONTINUE these medications which have NOT CHANGED   Details  amLODipine (NORVASC) 2.5 MG tablet Take 2.5 mg by mouth daily.    ENSURE (ENSURE) Take 240 mLs by mouth daily.    furosemide (LASIX) 20 MG tablet Take 60-80 mg by mouth daily. Take 3 tablets by mouth daily in the morning and 1 tablet by mouth daily at 12 noon    hydroxychloroquine (PLAQUENIL) 200 MG tablet Take 200 mg by mouth daily.    hydroxypropyl methylcellulose / hypromellose (ISOPTO TEARS / GONIOVISC) 2.5 % ophthalmic solution Place 1 drop into both eyes daily as needed for dry eyes. Place 1 drop into each eye every daily as needed for dry eyes    levothyroxine (SYNTHROID, LEVOTHROID) 50 MCG tablet Take 50 mcg by mouth daily before breakfast.    losartan (COZAAR) 50 MG tablet Take 50 mg by mouth daily.    Menthol, Topical Analgesic, (BENGAY VANISHING SCENT  EX) Apply 1 application topically 4 (four) times daily as needed.     PARoxetine (PAXIL) 10 MG tablet Take 10 mg by mouth daily.         DISCHARGE INSTRUCTIONS:   Follow-up PMD one week Follow-up home health  If you experience worsening of your admission symptoms, develop shortness of breath, life threatening emergency, suicidal or homicidal thoughts you must seek medical attention immediately by calling 911 or calling  your MD immediately  if symptoms less severe.  You Must read complete instructions/literature along with all the possible adverse reactions/side effects for all the Medicines you take and that have been prescribed to you. Take any new Medicines after you have completely understood and accept all the possible adverse reactions/side effects.   Please note  You were cared for by a hospitalist during your hospital stay. If you have any questions about your discharge medications or the care you received while you were in the hospital after you are discharged, you can call the unit and asked to speak with the hospitalist on call if the hospitalist that took care of you is not available. Once you are discharged, your primary care physician will handle any further medical issues. Please note that NO REFILLS for any discharge medications will be authorized once you are discharged, as it is imperative that you return to your primary care physician (or establish a relationship with a primary care physician if you do not have one) for your aftercare needs so that they can reassess your need for medications and monitor your lab values.    Today   CHIEF COMPLAINT:   Chief Complaint  Patient presents with  . Fall    HISTORY OF PRESENT ILLNESS:  Karen Bryan  is a 81 y.o. female with a known history of CHF presented with a fall requiring sutures of the lower extremities.   VITAL SIGNS:  Blood pressure 168/36, pulse 57, temperature 98.6 F (37 C), temperature source Oral, resp. rate 20, height 5' (1.524 m), weight 60.238 kg (132 lb 12.8 oz), SpO2 100 %.    PHYSICAL EXAMINATION:  GENERAL:  81 y.o.-year-old patient lying in the bed with no acute distress.  EYES: Pupils equal, round, reactive to light and accommodation. No scleral icterus. Extraocular muscles intact.  HEENT: Head atraumatic, normocephalic. Oropharynx and nasopharynx clear.  NECK:  Supple, no jugular venous distention. No thyroid  enlargement, no tenderness.  LUNGS: Normal breath sounds bilaterally, no wheezing, rales, positive rhonchi right base. No use of accessory muscles of respiration.  CARDIOVASCULAR: S1, S2 normal. 2/6 systolic  murmur,no  rubs, or gallops.  ABDOMEN: Soft, non-tender, non-distended. Bowel sounds present. No organomegaly or mass.  EXTREMITIES:  2+ edema,  no cyanosis, or clubbing.  NEUROLOGIC: Patient hard of hearing  PSYCHIATRIC: The patient is alert and oriented x 3.  SKIN: Sutures intact bilateral lower extremity no signs of infection  DATA REVIEW:   CBC  Recent Labs Lab 03/04/15 0211  WBC 6.1  HGB 10.1*  HCT 31.8*  PLT 225    Chemistries   Recent Labs Lab 03/06/15 0455  NA 136  K 4.2  CL 95*  CO2 35*  GLUCOSE 110*  BUN 38*  CREATININE 1.09*  CALCIUM 8.7*    Cardiac Enzymes  Recent Labs Lab 03/04/15 1528  TROPONINI <0.03    Microbiology Results  Results for orders placed or performed during the hospital encounter of 03/04/15  MRSA PCR Screening     Status: None   Collection Time:  03/04/15 11:52 AM  Result Value Ref Range Status   MRSA by PCR NEGATIVE NEGATIVE Final    Comment:        The GeneXpert MRSA Assay (FDA approved for NASAL specimens only), is one component of a comprehensive MRSA colonization surveillance program. It is not intended to diagnose MRSA infection nor to guide or monitor treatment for MRSA infections.     RADIOLOGY:  US Carotid Bilateral  03/04/2015  CLINICAL DATA:  81 year old female status post fall EXAM: BILATERAL CAROTID DUPLEX ULTRASOUND TECHNIQUE: Wallace Cullens scale imaging, color Doppler and duplex ultrasound were performed of bilateral carotid and vertebral arteries in the neck. COMPARISON:  Head CT 03/04/2015 FINDINGS: Criteria: Quantification of carotid stenosis is based on velocity parameters that correlate the residual internal carotid diameter with NASCET-based stenosis levels, using the diameter of the distal internal  carotid lumen as the denominator for stenosis measurement. The following velocity measurements were obtained: RIGHT ICA:  126/19 cm/sec CCA:  86/6 cm/sec SYSTOLIC ICA/CCA RATIO:  1.5 DIASTOLIC ICA/CCA RATIO:  3.1 ECA:  131 cm/sec LEFT ICA:  71/11 cm/sec CCA:  94/9 cm/sec SYSTOLIC ICA/CCA RATIO:  0.8 DIASTOLIC ICA/CCA RATIO:  1.2 ECA:  129 cm/sec RIGHT CAROTID ARTERY: Heterogeneous atherosclerotic plaque in the carotid bifurcation extending into the proximal internal carotid artery. By peak systolic velocity criteria the stenosis falls in the 50- 69% diameter range. RIGHT VERTEBRAL ARTERY:  Patent with normal antegrade flow. LEFT CAROTID ARTERY: Mild focal heterogeneous atherosclerotic plaque in the carotid bifurcation and proximal internal carotid artery. The plaque is irregular. By peak systolic velocity criteria, the estimated stenosis remains less than 50%. LEFT VERTEBRAL ARTERY:  Patent with normal antegrade flow. IMPRESSION: 1. Moderate (50- 69%) stenosis proximal right internal carotid artery secondary to heterogeneous atherosclerotic plaque. 2. Mild (1-49%) stenosis proximal left internal carotid artery secondary to irregular heterogeneous atherosclerotic plaque. 3. Vertebral arteries are patent with normal antegrade flow. Signed, Sterling Big, MD [facility] Electronically Signed   By: Malachy Moan M.D.   On: 03/04/2015 15:22    Management plans discussed with the patient,  and she is in agreement.Spoke with son yesterday afternoon and left a message for him this morning.  CODE STATUS:     Code Status Orders        Start     Ordered   03/04/15 0932  Do not attempt resuscitation (DNR)   Continuous    Question Answer Comment  In the event of cardiac or respiratory ARREST Do not call a "code blue"   In the event of cardiac or respiratory ARREST Do not perform Intubation, CPR, defibrillation or ACLS   In the event of cardiac  or respiratory ARREST Use medication by any route, position, wound care, and other measures to relive pain and suffering. May use oxygen, suction and manual treatment of airway obstruction as needed for comfort.      03/04/15 0931      TOTAL TIME TAKING CARE OF THIS PATIENT: 35  minutes.    Alford Highland M.D on 03/06/2015 at 8:21 AM  Between 7am to 6pm - Pager - [phone]  After 6pm go to [email or web address] - password [email or web address]  [facility]  Office  [phone]  CC: Primary care physician; Leanna Sato, MD

## 2015-03-06 NOTE — Progress Notes (Signed)
Discharge instructions explained to pts caretaker, Karen Bryan, iv and tele removed/ transported off unit by wheelchair.

## 2015-03-06 NOTE — Care Management Important Message (Signed)
Important Message  Patient Details  Name: Karen Bryan MRN: [id number] Date of Birth: 18-Sep-1919   Medicare Important Message Given:  Yes    [name] A Makinze Jani 03/06/2015, 9:56 AM

## 2015-03-06 NOTE — Progress Notes (Signed)
Patient is medically stable for D/C back to [facility] today. RN Tammy from [facility] she will pick patient up today and will bring portable oxygen tank and clothes for patient. Clinical Education officer, museum (CSW) prepared D/C packet and faxed D/C Summary and FL2 to [employer]. CSW met with patient and made her aware of above. CSW contacted patient's son Garnet Koyanagi and made him aware of above. Per son patient's appointment with Dr. Cristi Loron has been rescheulded for Monday. RN is aware of above. Please reconsult if future social work needs arise. CSW signing off.   Blima Rich, [place] [phone]

## 2015-03-06 NOTE — Progress Notes (Signed)
Initial appointment made at the [facility] on March 24, 2015 at 11:00am. Thank you.

## 2015-03-06 NOTE — NC FL2 (Signed)
[place] MEDICAID FL2 LEVEL OF CARE SCREENING TOOL     IDENTIFICATION  Patient Name: Karen Bryan Birthdate: July 06, 1919 Sex: female Admission Date (Current Location): 03/04/2015  [facility] and [place] Number:  Air cabin crew)  ([phone] [id number]) Facility and Address:  [facility], [address], [place], [place] [address]      Provider Number: [id number]  Attending Physician Name and Address:  Alford Highland, MD  Relative Name and Phone Number:       Current Level of Care: Hospital Recommended Level of Care: Assisted Living Facility Prior Approval Number:    Date Approved/Denied:   PASRR Number:    Discharge Plan: Domiciliary (Rest home)    Current Diagnoses: Patient Active Problem List   Diagnosis Date Noted  . Fall 03/04/2015  . Fluid excess 03/04/2015  . CHF (congestive heart failure) (HCC) 03/04/2015  . Cough 10/03/2014  . Dyspnea 10/03/2014  . ILD (interstitial lung disease) (HCC) 10/03/2014  . COPD exacerbation (HCC) 09/26/2014  . Hard of hearing 09/26/2014  . Hypertensive urgency 08/10/2014  . Acute on chronic diastolic heart failure (HCC) 08/10/2014  . Hypertension, essential 08/10/2014  . Rheumatoid arthritis (HCC) 08/10/2014  . COPD (chronic obstructive pulmonary disease) (HCC) 08/10/2014    Orientation ACTIVITIES/SOCIAL BLADDER RESPIRATION    Self, Place, Situation    Incontinent (at times) O2 (As needed) (2L)  BEHAVIORAL SYMPTOMS/MOOD NEUROLOGICAL BOWEL NUTRITION STATUS      Continent Diet (no sodium added)  PHYSICIAN VISITS COMMUNICATION OF NEEDS Height & Weight Skin    Verbally 5' (152.4 cm) 138 lbs. Skin abrasions, Other (Comment) (stitches on R & L leg)          AMBULATORY STATUS RESPIRATION    Assist extensive O2 (As needed) (2L)      Personal Care Assistance Level of Assistance  Bathing, Feeding, Dressing Bathing Assistance: Maximum assistance Feeding assistance: Limited assistance Dressing Assistance: Maximum  assistance      Functional Limitations Info  Sight, Hearing, Speech Sight Info: Adequate Hearing Info: Impaired Speech Info: Adequate       SPECIAL CARE FACTORS FREQUENCY  PT (By licensed PT)                   Additional Factors Info  Code Status, Allergies Code Status Info:  (DNR) Allergies Info:  (Tramadol)           Current Medications (03/06/2015):  This is the current hospital active medication list Current Facility-Administered Medications  Medication Dose Route Frequency Provider Last Rate Last Dose  . 0.9 %  sodium chloride infusion  250 mL Intravenous PRN Ihor Austin, MD      . acetaminophen (TYLENOL) tablet 650 mg  650 mg Oral Q4H PRN Ihor Austin, MD   650 mg at 03/06/15 0859  . amLODipine (NORVASC) tablet 2.5 mg  2.5 mg Oral Daily Alford Highland, MD   2.5 mg at 03/06/15 0859  . aspirin EC tablet 81 mg  81 mg Oral Daily Ihor Austin, MD   81 mg at 03/06/15 0859  . carvedilol (COREG) tablet 3.125 mg  3.125 mg Oral BID WC Ihor Austin, MD   3.125 mg at 03/06/15 0859  . enoxaparin (LOVENOX) injection 30 mg  30 mg Subcutaneous Q24H Ihor Austin, MD   30 mg at 03/05/15 2219  . feeding supplement (ENSURE ENLIVE) (ENSURE ENLIVE) liquid 237 mL  237 mL Oral Q1500 Alford Highland, MD   237 mL at 03/05/15 1600  . furosemide (LASIX) tablet 60 mg  60 mg  Oral Daily Alford Highland, MD   60 mg at 03/06/15 6759  . hydroxychloroquine (PLAQUENIL) tablet 200 mg  200 mg Oral Daily Ihor Austin, MD   200 mg at 03/06/15 0858  . levothyroxine (SYNTHROID, LEVOTHROID) tablet 50 mcg  50 mcg Oral QAC breakfast Ihor Austin, MD   50 mcg at 03/06/15 0859  . losartan (COZAAR) tablet 50 mg  50 mg Oral Daily Ihor Austin, MD   50 mg at 03/06/15 0858  . ondansetron (ZOFRAN) injection 4 mg  4 mg Intravenous Q6H PRN Pavan Pyreddy, MD      . PARoxetine (PAXIL) tablet 10 mg  10 mg Oral Daily Ihor Austin, MD   10 mg at 03/06/15 0859  . sodium chloride 0.9 % injection 3 mL  3 mL  Intravenous Q12H Ihor Austin, MD   3 mL at 03/05/15 2220  . sodium chloride 0.9 % injection 3 mL  3 mL Intravenous PRN Ihor Austin, MD         Discharge Medications: Please see discharge summary for a list of discharge medications.  DISCHARGE MEDICATIONS:   Current Discharge Medication List    CONTINUE these medications which have NOT CHANGED   Details  amLODipine (NORVASC) 2.5 MG tablet Take 2.5 mg by mouth daily.    ENSURE (ENSURE) Take 240 mLs by mouth daily.    furosemide (LASIX) 20 MG tablet Take 60-80 mg by mouth daily. Take 3 tablets by mouth daily in the morning and 1 tablet by mouth daily at 12 noon    hydroxychloroquine (PLAQUENIL) 200 MG tablet Take 200 mg by mouth daily.    hydroxypropyl methylcellulose / hypromellose (ISOPTO TEARS / GONIOVISC) 2.5 % ophthalmic solution Place 1 drop into both eyes daily as needed for dry eyes. Place 1 drop into each eye every daily as needed for dry eyes    levothyroxine (SYNTHROID, LEVOTHROID) 50 MCG tablet Take 50 mcg by mouth daily before breakfast.    losartan (COZAAR) 50 MG tablet Take 50 mg by mouth daily.    Menthol, Topical Analgesic, (BENGAY VANISHING SCENT EX) Apply 1 application topically 4 (four) times daily as needed.     PARoxetine (PAXIL) 10 MG tablet Take 10 mg by mouth daily.              Relevant Imaging Results:  Relevant Lab Results:  Recent Labs    Additional Information  (FM:[id number])  Haig Prophet, LCSW

## 2015-03-06 NOTE — Discharge Instructions (Addendum)
[facility] appointment on March 24, 2015 at 11:00am with Karen Kindred, FNP. Please call [phone] to reschedule.   Heart Failure Heart failure is a condition in which the heart has trouble pumping blood. This means your heart does not pump blood efficiently for your body to work well. In some cases of heart failure, fluid may back up into your lungs or you may have swelling (edema) in your lower legs. Heart failure is usually a long-term (chronic) condition. It is important for you to take good care of yourself and follow your health care provider's treatment plan. CAUSES  Some health conditions can cause heart failure. Those health conditions include:  High blood pressure (hypertension). Hypertension causes the heart muscle to work harder than normal. When pressure in the blood vessels is high, the heart needs to pump (contract) with more force in order to circulate blood throughout the body. High blood pressure eventually causes the heart to become stiff and weak.  Coronary artery disease (CAD). CAD is the buildup of cholesterol and fat (plaque) in the arteries of the heart. The blockage in the arteries deprives the heart muscle of oxygen and blood. This can cause chest pain and may lead to a heart attack. High blood pressure can also contribute to CAD.  Heart attack (myocardial infarction). A heart attack occurs when one or more arteries in the heart become blocked. The loss of oxygen damages the muscle tissue of the heart. When this happens, part of the heart muscle dies. The injured tissue does not contract as well and weakens the heart's ability to pump blood.  Abnormal heart valves. When the heart valves do not open and close properly, it can cause heart failure. This makes the heart muscle pump harder to keep the blood flowing.  Heart muscle disease (cardiomyopathy or myocarditis). Heart muscle disease is damage to the heart muscle from a variety of causes. These can include  drug or alcohol abuse, infections, or unknown reasons. These can increase the risk of heart failure.  Lung disease. Lung disease makes the heart work harder because the lungs do not work properly. This can cause a strain on the heart, leading it to fail.  Diabetes. Diabetes increases the risk of heart failure. High blood sugar contributes to high fat (lipid) levels in the blood. Diabetes can also cause slow damage to tiny blood vessels that carry important nutrients to the heart muscle. When the heart does not get enough oxygen and food, it can cause the heart to become weak and stiff. This leads to a heart that does not contract efficiently.  Other conditions can contribute to heart failure. These include abnormal heart rhythms, thyroid problems, and low blood counts (anemia). Certain unhealthy behaviors can increase the risk of heart failure, including:  Being overweight.  Smoking or chewing tobacco.  Eating foods high in fat and cholesterol.  Abusing illicit drugs or alcohol.  Lacking physical activity. SYMPTOMS  Heart failure symptoms may vary and can be hard to detect. Symptoms may include:  Shortness of breath with activity, such as climbing stairs.  Persistent cough.  Swelling of the feet, ankles, legs, or abdomen.  Unexplained weight gain.  Difficulty breathing when lying flat (orthopnea).  Waking from sleep because of the need to sit up and get more air.  Rapid heartbeat.  Fatigue and loss of energy.  Feeling light-headed, dizzy, or close to fainting.  Loss of appetite.  Nausea.  Increased urination during the night (nocturia). DIAGNOSIS  A diagnosis of  heart failure is based on your history, symptoms, physical examination, and diagnostic tests. Diagnostic tests for heart failure may include:  Echocardiography.  Electrocardiography.  Chest X-ray.  Blood tests.  Exercise stress test.  Cardiac angiography.  Radionuclide scans. TREATMENT  Treatment  is aimed at managing the symptoms of heart failure. Medicines, behavioral changes, or surgical intervention may be necessary to treat heart failure.  Medicines to help treat heart failure may include:  Angiotensin-converting enzyme (ACE) inhibitors. This type of medicine blocks the effects of a blood protein called angiotensin-converting enzyme. ACE inhibitors relax (dilate) the blood vessels and help lower blood pressure.  Angiotensin receptor blockers (ARBs). This type of medicine blocks the actions of a blood protein called angiotensin. Angiotensin receptor blockers dilate the blood vessels and help lower blood pressure.  Water pills (diuretics). Diuretics cause the kidneys to remove salt and water from the blood. The extra fluid is removed through urination. This loss of extra fluid lowers the volume of blood the heart pumps.  Beta blockers. These prevent the heart from beating too fast and improve heart muscle strength.  Digitalis. This increases the force of the heartbeat.  Healthy behavior changes include:  Obtaining and maintaining a healthy weight.  Stopping smoking or chewing tobacco.  Eating heart-healthy foods.  Limiting or avoiding alcohol.  Stopping illicit drug use.  Physical activity as directed by your health care provider.  Surgical treatment for heart failure may include:  A procedure to open blocked arteries, repair damaged heart valves, or remove damaged heart muscle tissue.  A pacemaker to improve heart muscle function and control certain abnormal heart rhythms.  An internal cardioverter defibrillator to treat certain serious abnormal heart rhythms.  A left ventricular assist device (LVAD) to assist the pumping ability of the heart. HOME CARE INSTRUCTIONS   Take medicines only as directed by your health care provider. Medicines are important in reducing the workload of your heart, slowing the progression of heart failure, and improving your symptoms.  Do  not stop taking your medicine unless directed by your health care provider.  Do not skip any dose of medicine.  Refill your prescriptions before you run out of medicine. Your medicines are needed every day.  Engage in moderate physical activity if directed by your health care provider. Moderate physical activity can benefit some people. The elderly and people with severe heart failure should consult with a health care provider for physical activity recommendations.  Eat heart-healthy foods. Food choices should be free of trans fat and low in saturated fat, cholesterol, and salt (sodium). Healthy choices include fresh or frozen fruits and vegetables, fish, lean meats, legumes, fat-free or low-fat dairy products, and whole grain or high fiber foods. Talk to a dietitian to learn more about heart-healthy foods.  Limit sodium if directed by your health care provider. Sodium restriction may reduce symptoms of heart failure in some people. Talk to a dietitian to learn more about heart-healthy seasonings.  Use healthy cooking methods. Healthy cooking methods include roasting, grilling, broiling, baking, poaching, steaming, or stir-frying. Talk to a dietitian to learn more about healthy cooking methods.  Limit fluids if directed by your health care provider. Fluid restriction may reduce symptoms of heart failure in some people.  Weigh yourself every day. Daily weights are important in the early recognition of excess fluid. You should weigh yourself every morning after you urinate and before you eat breakfast. Wear the same amount of clothing each time you weigh yourself. Record your daily weight. Provide  your health care provider with your weight record.  Monitor and record your blood pressure if directed by your health care provider.  Check your pulse if directed by your health care provider.  Lose weight if directed by your health care provider. Weight loss may reduce symptoms of heart failure in some  people.  Stop smoking or chewing tobacco. Nicotine makes your heart work harder by causing your blood vessels to constrict. Do not use nicotine gum or patches before talking to your health care provider.  Keep all follow-up visits as directed by your health care provider. This is important.  Limit alcohol intake to no more than 1 drink per day for nonpregnant women and 2 drinks per day for men. One drink equals 12 ounces of beer, 5 ounces of wine, or 1 ounces of hard liquor. Drinking more than that is harmful to your heart. Tell your health care provider if you drink alcohol several times a week. Talk with your health care provider about whether alcohol is safe for you. If your heart has already been damaged by alcohol or you have severe heart failure, drinking alcohol should be stopped completely.  Stop illicit drug use.  Stay up-to-date with immunizations. It is especially important to prevent respiratory infections through current pneumococcal and influenza immunizations.  Manage other health conditions such as hypertension, diabetes, thyroid disease, or abnormal heart rhythms as directed by your health care provider.  Learn to manage stress.  Plan rest periods when fatigued.  Learn strategies to manage high temperatures. If the weather is extremely hot:  Avoid vigorous physical activity.  Use air conditioning or fans or seek a cooler location.  Avoid caffeine and alcohol.  Wear loose-fitting, lightweight, and light-colored clothing.  Learn strategies to manage cold temperatures. If the weather is extremely cold:  Avoid vigorous physical activity.  Layer clothes.  Wear mittens or gloves, a hat, and a scarf when going outside.  Avoid alcohol.  Obtain ongoing education and support as needed.  Participate in or seek rehabilitation as needed to maintain or improve independence and quality of life. SEEK MEDICAL CARE IF:   You have a rapid weight gain.  You have increasing  shortness of breath that is unusual for you.  You are unable to participate in your usual physical activities.  You tire easily.  You cough more than normal, especially with physical activity.  You have any or more swelling in areas such as your hands, feet, ankles, or abdomen.  You are unable to sleep because it is hard to breathe.  You feel like your heart is beating fast (palpitations).  You become dizzy or light-headed upon standing up. SEEK IMMEDIATE MEDICAL CARE IF:   You have difficulty breathing.  There is a change in mental status such as decreased alertness or difficulty with concentration.  You have a pain or discomfort in your chest.  You have an episode of fainting (syncope). MAKE SURE YOU:   Understand these instructions.  Will watch your condition.  Will get help right away if you are not doing well or get worse.   This information is not intended to replace advice given to you by your health care provider. Make sure you discuss any questions you have with your health care provider.   Document Released: 03/22/2005 Document Revised: 08/06/2014 Document Reviewed: 04/21/2012 Elsevier Interactive Patient Education [employer].

## 2015-03-24 ENCOUNTER — Telehealth: Payer: Self-pay | Admitting: Family

## 2015-03-24 ENCOUNTER — Ambulatory Visit: Payer: Medicare Other | Admitting: Family

## 2015-03-24 NOTE — Telephone Encounter (Signed)
Patient did not show for her initial appointment at the [facility]. Will attempt to reschedule.

## 2015-04-28 ENCOUNTER — Ambulatory Visit (INDEPENDENT_AMBULATORY_CARE_PROVIDER_SITE_OTHER): Payer: Medicare Other | Admitting: Cardiovascular Disease

## 2015-04-28 ENCOUNTER — Encounter: Payer: Self-pay | Admitting: Cardiovascular Disease

## 2015-04-28 VITALS — BP 148/70 | HR 62 | Ht 60.0 in | Wt 130.0 lb

## 2015-04-28 DIAGNOSIS — J449 Chronic obstructive pulmonary disease, unspecified: Secondary | ICD-10-CM

## 2015-04-28 DIAGNOSIS — H9193 Unspecified hearing loss, bilateral: Secondary | ICD-10-CM

## 2015-04-28 DIAGNOSIS — R011 Cardiac murmur, unspecified: Secondary | ICD-10-CM

## 2015-04-28 DIAGNOSIS — W19XXXS Unspecified fall, sequela: Secondary | ICD-10-CM | POA: Diagnosis not present

## 2015-04-28 DIAGNOSIS — I5033 Acute on chronic diastolic (congestive) heart failure: Secondary | ICD-10-CM

## 2015-04-28 DIAGNOSIS — I10 Essential (primary) hypertension: Secondary | ICD-10-CM | POA: Diagnosis not present

## 2015-04-28 NOTE — Progress Notes (Signed)
Patient ID: Karen Bryan, female    DOB: [birth year], 82 y.o.   MRN: [id number]  HPI Comments: 82 y.o. female with history of chronic diastolic CHF, chronic respiratory failure on 2 L home oxygen but never smoked, recurrent PNA with possible aspiration, and RA presenting for follow-up of her acute on chronic diastolic CHF She has a history of Chronic lung disease, Suspected pulmonary fibrosis/interstitial lung disease Bilateral interstitial thickening on CXR, chronic issue  She is hard of hearing, currently does not have hearing aids We have to write everything for her to read about her condition   she has some leg edema, recent falls, lacerations to her left leg below the knee Swelling is worse on the left than the right Caretaker reports that she sits in a recliner all day with her legs up Unable to tolerate compression hose as legs are tender She denies any significant shortness of breath She continues on Lasix 60 mg daily  Recent echocardiogram reviewed with her from November 2016 showing normal ejection fraction, normal right ventricular systolic pressure, and mitral valve chordae noted, no significant regurgitation or stenosis  EKG on today's visit shows normal sinus rhythm with rate 62 bpm, no significant ST or T-wave changes, unable to exclude LVH  Other past medical history reviewed  Recent CT scan showing bronchitis and bronchiectasis as well as three-vessel coronary artery disease Recent lab work showing hematocrit 31, creatinine 1.3  Hospitalization May 2016 for COPD flare, possible diastolic CHF Emergency room visit September 26 for hypoxia, chest x-ray showing bronchitis and bronchiectasis    Allergies  Allergen Reactions  . Tramadol Nausea Only    Current Outpatient Prescriptions on File Prior to Visit  Medication Sig Dispense Refill  . ENSURE (ENSURE) Take 240 mLs by mouth daily.    . furosemide (LASIX) 20 MG tablet Take 60-80 mg by mouth daily. Take 3 tablets  by mouth daily in the morning and 1 tablet by mouth daily at 12 noon    . hydroxychloroquine (PLAQUENIL) 200 MG tablet Take 200 mg by mouth daily.    . hydroxypropyl methylcellulose / hypromellose (ISOPTO TEARS / GONIOVISC) 2.5 % ophthalmic solution Place 1 drop into both eyes daily as needed for dry eyes. Place 1 drop into each eye every daily as needed for dry eyes    . levothyroxine (SYNTHROID, LEVOTHROID) 50 MCG tablet Take 50 mcg by mouth daily before breakfast.    . losartan (COZAAR) 50 MG tablet Take 50 mg by mouth daily.    . Menthol, Topical Analgesic, (BENGAY VANISHING SCENT EX) Apply 1 application topically 4 (four) times daily as needed.     Marland Kitchen PARoxetine (PAXIL) 10 MG tablet Take 10 mg by mouth daily.     No current facility-administered medications on file prior to visit.    Past Medical History  Diagnosis Date  . COPD (chronic obstructive pulmonary disease) (HCC)   . CHF (congestive heart failure) (HCC)   . Hypertension   . Bilateral pneumonia   . Chronic respiratory failure (HCC)   . Bilateral pleural effusion     Past Surgical History  Procedure Laterality Date  . Hip surgery      Social History  reports that she has never smoked. She has never used smokeless tobacco. She reports that she does not drink alcohol or use illicit drugs.  Family History family history includes Arthritis in her father; Pneumonia in her mother.   Review of Systems  Unable to perform ROS Constitutional: Negative.  Respiratory: Negative.   Cardiovascular: Negative.   Gastrointestinal: Negative.   Endocrine: Negative.   Musculoskeletal: Negative.   Neurological: Negative.   Hematological: Negative.   Psychiatric/Behavioral: Negative.   All other systems reviewed and are negative.    BP 148/70 mmHg  Pulse 62  Ht 5' (1.524 m)  Wt 130 lb (58.968 kg)  BMI 25.39 kg/m2  LMP  (LMP Unknown)  Physical Exam  Constitutional: She is oriented to person, place, and time. She appears  well-nourished.  Elderly woman presenting in a wheelchair, very hard of hearing, have to write everything on paper  HENT:  Head: Normocephalic.  Nose: Nose normal.  Mouth/Throat: Oropharynx is clear and moist.  Eyes: Conjunctivae are normal.  Neck: Normal range of motion. Neck supple. No JVD present.  Cardiovascular: Normal rate, regular rhythm, normal heart sounds and intact distal pulses.  Exam reveals no gallop and no friction rub.   No murmur heard. Trace edema from the mid shin to the ankle on the right, trace edema with regions of erythema, laceration, one region of blistering of the left lower extremity  Pulmonary/Chest: Effort normal. No respiratory distress. She has decreased breath sounds. She has rales. She exhibits no tenderness.  Abdominal: Soft. Bowel sounds are normal. She exhibits no distension. There is no tenderness.  Musculoskeletal: Normal range of motion. She exhibits no edema or tenderness.  Lymphadenopathy:    She has no cervical adenopathy.  Neurological: She is alert and oriented to person, place, and time. Coordination normal.  Skin: Skin is warm and dry. No rash noted. No erythema.  Psychiatric: Her behavior is normal.

## 2015-04-28 NOTE — Assessment & Plan Note (Signed)
Leg edema likely from venous insufficiency Stable renal function, we'll continue Lasix 60 mg daily

## 2015-04-28 NOTE — Assessment & Plan Note (Signed)
Long time spent with her today, writing down medical issues for discussion with her Unable to communicate verbally, need to write everything More than 25 minutes spent with her in clinic today

## 2015-04-28 NOTE — Assessment & Plan Note (Signed)
Stable respiratory status, no recent COPD exacerbation Clinical exam suggestive of pulmonary fibrosis

## 2015-04-28 NOTE — Assessment & Plan Note (Signed)
Recent falls with a large laceration to her left lower extremity Appeared to be healing

## 2015-04-28 NOTE — Patient Instructions (Addendum)
You are doing well. No medication changes were made.  Leg elevation if possible  Please call us if you have new issues that need to be addressed before your next appt.  Your physician wants you to follow-up in: 6 months.  You will receive a reminder letter in the mail two months in advance. If you don't receive a letter, please call our office to schedule the follow-up appointment.

## 2015-04-28 NOTE — Assessment & Plan Note (Signed)
Blood pressure is well controlled on today's visit. No changes made to the medications. Blood pressure at the living facility is 130 systolic

## 2015-06-07 ENCOUNTER — Emergency Department: Payer: Medicare Other

## 2015-06-07 ENCOUNTER — Encounter: Payer: Self-pay | Admitting: Emergency Medicine

## 2015-06-07 ENCOUNTER — Inpatient Hospital Stay
Admission: EM | Admit: 2015-06-07 | Discharge: 2015-06-09 | DRG: 641 | Disposition: A | Discharge status: Home Health | Payer: Medicare Other | Attending: Internal Medicine | Admitting: Internal Medicine

## 2015-06-07 DIAGNOSIS — I5032 Chronic diastolic (congestive) heart failure: Secondary | ICD-10-CM | POA: Diagnosis present

## 2015-06-07 DIAGNOSIS — L089 Local infection of the skin and subcutaneous tissue, unspecified: Secondary | ICD-10-CM | POA: Diagnosis present

## 2015-06-07 DIAGNOSIS — Z66 Do not resuscitate: Secondary | ICD-10-CM | POA: Diagnosis present

## 2015-06-07 DIAGNOSIS — M069 Rheumatoid arthritis, unspecified: Secondary | ICD-10-CM | POA: Diagnosis present

## 2015-06-07 DIAGNOSIS — N189 Chronic kidney disease, unspecified: Secondary | ICD-10-CM | POA: Diagnosis present

## 2015-06-07 DIAGNOSIS — E871 Hypo-osmolality and hyponatremia: Secondary | ICD-10-CM | POA: Diagnosis present

## 2015-06-07 DIAGNOSIS — I10 Essential (primary) hypertension: Secondary | ICD-10-CM

## 2015-06-07 DIAGNOSIS — R05 Cough: Secondary | ICD-10-CM | POA: Diagnosis not present

## 2015-06-07 DIAGNOSIS — K5900 Constipation, unspecified: Secondary | ICD-10-CM | POA: Diagnosis present

## 2015-06-07 DIAGNOSIS — E875 Hyperkalemia: Secondary | ICD-10-CM | POA: Diagnosis present

## 2015-06-07 DIAGNOSIS — Z79899 Other long term (current) drug therapy: Secondary | ICD-10-CM

## 2015-06-07 DIAGNOSIS — E039 Hypothyroidism, unspecified: Secondary | ICD-10-CM | POA: Diagnosis present

## 2015-06-07 DIAGNOSIS — R059 Cough, unspecified: Secondary | ICD-10-CM

## 2015-06-07 DIAGNOSIS — I130 Hypertensive heart and chronic kidney disease with heart failure and stage 1 through stage 4 chronic kidney disease, or unspecified chronic kidney disease: Secondary | ICD-10-CM | POA: Diagnosis present

## 2015-06-07 DIAGNOSIS — J9610 Chronic respiratory failure, unspecified whether with hypoxia or hypercapnia: Secondary | ICD-10-CM | POA: Diagnosis present

## 2015-06-07 DIAGNOSIS — N179 Acute kidney failure, unspecified: Secondary | ICD-10-CM | POA: Diagnosis present

## 2015-06-07 DIAGNOSIS — E860 Dehydration: Secondary | ICD-10-CM | POA: Diagnosis present

## 2015-06-07 DIAGNOSIS — J449 Chronic obstructive pulmonary disease, unspecified: Secondary | ICD-10-CM | POA: Diagnosis present

## 2015-06-07 DIAGNOSIS — Z8261 Family history of arthritis: Secondary | ICD-10-CM | POA: Diagnosis not present

## 2015-06-07 DIAGNOSIS — I509 Heart failure, unspecified: Secondary | ICD-10-CM

## 2015-06-07 LAB — URINALYSIS COMPLETE WITH MICROSCOPIC (ARMC ONLY)
BILIRUBIN URINE: NEGATIVE
GLUCOSE, UA: NEGATIVE mg/dL
Hgb urine dipstick: NEGATIVE
KETONES UR: NEGATIVE mg/dL
Leukocytes, UA: NEGATIVE
NITRITE: NEGATIVE
Protein, ur: NEGATIVE mg/dL
Specific Gravity, Urine: 1.004 — ABNORMAL LOW (ref 1.005–1.030)
WBC UA: NONE SEEN WBC/hpf (ref 0–5)
pH: 6 (ref 5.0–8.0)

## 2015-06-07 LAB — CBC
HEMATOCRIT: 33.4 % — AB (ref 35.0–47.0)
HEMOGLOBIN: 11.3 g/dL — AB (ref 12.0–16.0)
MCH: 31.3 pg (ref 26.0–34.0)
MCHC: 33.8 g/dL (ref 32.0–36.0)
MCV: 92.6 fL (ref 80.0–100.0)
Platelets: 269 10*3/uL (ref 150–440)
RBC: 3.61 MIL/uL — ABNORMAL LOW (ref 3.80–5.20)
RDW: 14.9 % — ABNORMAL HIGH (ref 11.5–14.5)
WBC: 7.5 10*3/uL (ref 3.6–11.0)

## 2015-06-07 LAB — COMPREHENSIVE METABOLIC PANEL
ALBUMIN: 4 g/dL (ref 3.5–5.0)
ALK PHOS: 85 U/L (ref 38–126)
ALT: 7 U/L — ABNORMAL LOW (ref 14–54)
ANION GAP: 11 (ref 5–15)
AST: 14 U/L — ABNORMAL LOW (ref 15–41)
BILIRUBIN TOTAL: 0.4 mg/dL (ref 0.3–1.2)
BUN: 44 mg/dL — ABNORMAL HIGH (ref 6–20)
CALCIUM: 8.8 mg/dL — AB (ref 8.9–10.3)
CO2: 24 mmol/L (ref 22–32)
Chloride: 96 mmol/L — ABNORMAL LOW (ref 101–111)
Creatinine, Ser: 1.54 mg/dL — ABNORMAL HIGH (ref 0.44–1.00)
GFR calc non Af Amer: 28 mL/min — ABNORMAL LOW (ref >60)
GFR, EST AFRICAN AMERICAN: 32 mL/min — AB (ref >60)
GLUCOSE: 121 mg/dL — AB (ref 65–99)
POTASSIUM: 5.8 mmol/L — AB (ref 3.5–5.1)
Sodium: 131 mmol/L — ABNORMAL LOW (ref 135–145)
TOTAL PROTEIN: 7.6 g/dL (ref 6.5–8.1)

## 2015-06-07 LAB — RAPID INFLUENZA A&B ANTIGENS (ARMC ONLY): INFLUENZA B (ARMC): NEGATIVE

## 2015-06-07 LAB — LIPASE, BLOOD: Lipase: 29 U/L (ref 11–51)

## 2015-06-07 LAB — MRSA PCR SCREENING: MRSA by PCR: NEGATIVE

## 2015-06-07 LAB — TROPONIN I

## 2015-06-07 LAB — RAPID INFLUENZA A&B ANTIGENS: Influenza A (ARMC): NEGATIVE

## 2015-06-07 MED ORDER — SODIUM CHLORIDE 0.9% FLUSH
3.0000 mL | Freq: Two times a day (BID) | INTRAVENOUS | Status: DC
  Administered 2015-06-07 – 2015-06-09 (×4): 3 mL via INTRAVENOUS

## 2015-06-07 MED ORDER — LEVOTHYROXINE SODIUM 50 MCG PO TABS
50.0000 ug | ORAL_TABLET | Freq: Every day | ORAL | Status: DC
  Administered 2015-06-08 – 2015-06-09 (×2): 50 ug via ORAL
  Filled 2015-06-07 (×2): qty 1

## 2015-06-07 MED ORDER — SODIUM BICARBONATE 8.4 % IV SOLN
50.0000 meq | Freq: Once | INTRAVENOUS | Status: AC
  Administered 2015-06-07: 50 meq via INTRAVENOUS
  Filled 2015-06-07: qty 50

## 2015-06-07 MED ORDER — MAGNESIUM SULFATE (LAXATIVE) PO GRAN: 1.0000 g | GRANULES | Freq: Every day | ORAL | Status: DC

## 2015-06-07 MED ORDER — MUSCLE RUB 10-15 % EX CREA
TOPICAL_CREAM | CUTANEOUS | Status: DC | PRN
  Filled 2015-06-07: qty 85

## 2015-06-07 MED ORDER — HEPARIN SODIUM (PORCINE) 5000 UNIT/ML IJ SOLN
5000.0000 [IU] | Freq: Three times a day (TID) | INTRAMUSCULAR | Status: DC
  Administered 2015-06-07 – 2015-06-09 (×5): 5000 [IU] via SUBCUTANEOUS
  Filled 2015-06-07 (×5): qty 1

## 2015-06-07 MED ORDER — SODIUM POLYSTYRENE SULFONATE 15 GM/60ML PO SUSP
30.0000 g | Freq: Once | ORAL | Status: AC
  Administered 2015-06-07: 30 g via ORAL
  Filled 2015-06-07: qty 120

## 2015-06-07 MED ORDER — MENTHOL (TOPICAL ANALGESIC) 2.5 % EX GEL: Freq: Four times a day (QID) | CUTANEOUS | Status: DC

## 2015-06-07 MED ORDER — SODIUM CHLORIDE 0.9 % IV BOLUS (SEPSIS)
500.0000 mL | Freq: Once | INTRAVENOUS | Status: AC
  Administered 2015-06-07: 500 mL via INTRAVENOUS

## 2015-06-07 MED ORDER — HYDRALAZINE HCL 20 MG/ML IJ SOLN: 10.0000 mg | Freq: Four times a day (QID) | INTRAMUSCULAR | Status: DC | PRN

## 2015-06-07 MED ORDER — MAGNESIUM SULFATE (LAXATIVE) PO GRAN
1.0000 g | GRANULES | Freq: Every day | ORAL | Status: DC | PRN
  Filled 2015-06-07: qty 454

## 2015-06-07 MED ORDER — SPIRONOLACTONE 25 MG PO TABS: 25.0000 mg | ORAL_TABLET | Freq: Every day | ORAL | Status: DC

## 2015-06-07 MED ORDER — AMLODIPINE BESYLATE 5 MG PO TABS
2.5000 mg | ORAL_TABLET | Freq: Every day | ORAL | Status: DC
  Administered 2015-06-08 – 2015-06-09 (×2): 2.5 mg via ORAL
  Filled 2015-06-07 (×2): qty 1

## 2015-06-07 MED ORDER — LOSARTAN POTASSIUM 50 MG PO TABS
50.0000 mg | ORAL_TABLET | Freq: Every day | ORAL | Status: DC
  Administered 2015-06-08 – 2015-06-09 (×2): 50 mg via ORAL
  Filled 2015-06-07 (×2): qty 1

## 2015-06-07 MED ORDER — PAROXETINE HCL 10 MG PO TABS
5.0000 mg | ORAL_TABLET | Freq: Every day | ORAL | Status: DC
  Administered 2015-06-08 – 2015-06-09 (×2): 5 mg via ORAL
  Filled 2015-06-07 (×3): qty 0.5

## 2015-06-07 MED ORDER — ACETAMINOPHEN 325 MG PO TABS
650.0000 mg | ORAL_TABLET | Freq: Four times a day (QID) | ORAL | Status: DC | PRN
  Administered 2015-06-07: 650 mg via ORAL
  Filled 2015-06-07 (×2): qty 2

## 2015-06-07 MED ORDER — CALCIUM GLUCONATE 10 % IV SOLN
1.0000 g | Freq: Once | INTRAVENOUS | Status: AC
  Administered 2015-06-07: 1 g via INTRAVENOUS
  Filled 2015-06-07: qty 10

## 2015-06-07 MED ORDER — ACETAMINOPHEN 500 MG PO TABS
500.0000 mg | ORAL_TABLET | Freq: Three times a day (TID) | ORAL | Status: DC
  Administered 2015-06-07 – 2015-06-09 (×5): 500 mg via ORAL
  Filled 2015-06-07 (×5): qty 1

## 2015-06-07 MED ORDER — HYDROXYCHLOROQUINE SULFATE 200 MG PO TABS
200.0000 mg | ORAL_TABLET | Freq: Every day | ORAL | Status: DC
  Administered 2015-06-08 – 2015-06-09 (×2): 200 mg via ORAL
  Filled 2015-06-07 (×2): qty 1

## 2015-06-07 NOTE — Progress Notes (Signed)
Patient admitted to unit. Oriented to room, call bell, and staff. Bed in lowest position. Fall safety plan reviewed. Full assessment to Epic. Skin assessment verified with --Jennette Dubin RN---. Telemetry box verification with tele monitor clerk and Maddy NT- Box#: --[id number]. Will continue to monitor. Admission questions completed - patient unsure about flu/PNA vaccines, wants [place] to ask family when the arrive. Will inform next shift if they do not get here prior to this RN leaving.

## 2015-06-07 NOTE — Progress Notes (Signed)
Patient requesting tylenol for chronic arthritic pain. Dr. Elisabeth Pigeon notified. Order for 650 mg PO tylenol q6h for pain

## 2015-06-07 NOTE — H&P (Signed)
[facility] [place] [facility]   PATIENT NAME: Karen Bryan    MR#:  [id number]  DATE OF BIRTH:  01-04-1920  DATE OF ADMISSION:  06/07/2015  PRIMARY CARE PHYSICIAN: Leanna Sato, MD   REQUESTING/REFERRING PHYSICIAN: Scotty Court  CHIEF COMPLAINT:   Chief Complaint  Patient presents with  . Respiratory Distress    HISTORY OF PRESENT ILLNESS: Karen Bryan  is a 82 y.o. female with a known history of COPD, CHF, hypertension, bilateral pneumonia, chronic respiratory failure required 2 dL oxygen holder, bilateral pleural effusion- lives in [facility], and at baseline walks with a Hackler, has some hearing deficit but no dementia. Today she was not feeling so well and so she spoke to the nurse at her facility she checked her blood pressure it was on the higher side and her having this cardiac history see decided to send her to the emergency room for that. In ER she was noted to have slight worsening in her renal function and elevated potassium, with elevated blood pressure so she is given his admission to hospitalist team. She is already given Kayexalate by ER physician for high potassium. Patient denies any complain.  PAST MEDICAL HISTORY:   Past Medical History  Diagnosis Date  . COPD (chronic obstructive pulmonary disease) (HCC)   . CHF (congestive heart failure) (HCC)   . Hypertension   . Bilateral pneumonia   . Chronic respiratory failure (HCC)   . Bilateral pleural effusion     PAST SURGICAL HISTORY: Past Surgical History  Procedure Laterality Date  . Hip surgery      SOCIAL HISTORY:  Social History  Substance Use Topics  . Smoking status: Never Smoker   . Smokeless tobacco: Never Used  . Alcohol Use: No    FAMILY HISTORY:  Family History  Problem Relation Age of Onset  . Pneumonia Mother   . Arthritis Father     DRUG ALLERGIES:  Allergies  Allergen Reactions  . Tramadol Nausea Only    REVIEW OF SYSTEMS:    CONSTITUTIONAL: No fever, positive for fatigue or weakness.  EYES: No blurred or double vision.  EARS, NOSE, AND THROAT: No tinnitus or ear pain.  RESPIRATORY: No cough, shortness of breath, wheezing or hemoptysis.  CARDIOVASCULAR: No chest pain, orthopnea, edema.  GASTROINTESTINAL: No nausea, vomiting, diarrhea or abdominal pain.  GENITOURINARY: No dysuria, hematuria.  ENDOCRINE: No polyuria, nocturia,  HEMATOLOGY: No anemia, easy bruising or bleeding SKIN: No rash or lesion. MUSCULOSKELETAL: No joint pain or arthritis.   NEUROLOGIC: No tingling, numbness, weakness.  PSYCHIATRY: No anxiety or depression.   MEDICATIONS AT HOME:  Prior to Admission medications   Medication Sig Start Date End Date Taking? Authorizing Provider  acetaminophen (TYLENOL) 500 MG tablet Take 500 mg by mouth 3 (three) times daily. *Not to exceed 3gm in 24 hours*   Yes Historical Provider, MD  amLODipine (NORVASC) 2.5 MG tablet Take 2.5 mg by mouth daily.   Yes Historical Provider, MD  Emollient (CETAPHIL MOISTURIZING EX) Apply 1 application topically 2 (two) times daily. Apply cream to bilateral legs until resolved.   Yes Historical Provider, MD  ENSURE (ENSURE) Take 240 mLs by mouth daily.   Yes Historical Provider, MD  furosemide (LASIX) 20 MG tablet Take 20-60 mg by mouth See admin instructions. Take 3 tablets (60mg ) by mouth daily in the morning at 8am and 1 tablet by mouth daily at 12 noon.   Yes Historical Provider, MD  hydroxychloroquine (PLAQUENIL) 200  MG tablet Take 200 mg by mouth daily.   Yes Historical Provider, MD  hydroxypropyl methylcellulose / hypromellose (ISOPTO TEARS / GONIOVISC) 2.5 % ophthalmic solution Place 1 drop into both eyes daily as needed for dry eyes.    Yes Historical Provider, MD  levothyroxine (SYNTHROID, LEVOTHROID) 50 MCG tablet Take 50 mcg by mouth daily before breakfast.   Yes Historical Provider, MD  losartan (COZAAR) 50 MG tablet Take 50 mg by mouth daily.   Yes Historical  Provider, MD  magnesium sulfate (EPSOM SALT) GRAN Take 1 g by mouth daily. Use 1 cup to soak foot.   Yes Historical Provider, MD  Menthol, Topical Analgesic, (BENGAY VANISHING SCENT EX) Apply 1 application topically 4 (four) times daily.    Yes Historical Provider, MD  PARoxetine (PAXIL) 10 MG tablet Take 5 mg by mouth daily. X 30 days. Then stop. 05/26/15 06/24/15 Yes Historical Provider, MD  spironolactone (ALDACTONE) 25 MG tablet Take 25 mg by mouth daily.   Yes Historical Provider, MD      PHYSICAL EXAMINATION:   VITAL SIGNS: Blood pressure 206/91, pulse 75, temperature 97.8 F (36.6 C), temperature source Oral, resp. rate 18, height 5' (1.524 m), weight 63.504 kg (140 lb), SpO2 100 %.  GENERAL:  82 y.o.-year-old patient lying in the bed with no acute distress.  EYES: Pupils equal, round, reactive to light and accommodation. No scleral icterus. Extraocular muscles intact.  HEENT: Head atraumatic, normocephalic. Oropharynx and nasopharynx clear. Hearing deficit, but able to hear properly if I speak directly in her right ear. NECK:  Supple, no jugular venous distention. No thyroid enlargement, no tenderness.  LUNGS: Normal breath sounds bilaterally, no wheezing, rales,rhonchi or crepitation. No use of accessory muscles of respiration.  CARDIOVASCULAR: S1, S2 normal. Systolic  Murmurs present .  ABDOMEN: Soft, nontender, nondistended. Bowel sounds present. No organomegaly or mass.  EXTREMITIES: No pedal edema, cyanosis, or clubbing. Deformities on her arms on fingers because of arthritis. NEUROLOGIC: Cranial nerves II through XII are intact. Muscle strength 4/5 in all extremities. Sensation intact. Gait not checked.  PSYCHIATRIC: The patient is alert and oriented x 3.  SKIN: No obvious rash, lesion, or ulcer.   LABORATORY PANEL:   CBC  Recent Labs Lab 06/07/15 1450  WBC 7.5  HGB 11.3*  HCT 33.4*  PLT 269  MCV 92.6  MCH 31.3  MCHC 33.8  RDW 14.9*    ------------------------------------------------------------------------------------------------------------------  Chemistries   Recent Labs Lab 06/07/15 1302  NA 131*  K 5.8*  CL 96*  CO2 24  GLUCOSE 121*  BUN 44*  CREATININE 1.54*  CALCIUM 8.8*  AST 14*  ALT 7*  ALKPHOS 85  BILITOT 0.4   ------------------------------------------------------------------------------------------------------------------ estimated creatinine clearance is 19 mL/min (by C-G formula based on Cr of 1.54). ------------------------------------------------------------------------------------------------------------------ No results for input(s): TSH, T4TOTAL, T3FREE, THYROIDAB in the last 72 hours.  Invalid input(s): FREET3   Coagulation profile No results for input(s): INR, PROTIME in the last 168 hours. ------------------------------------------------------------------------------------------------------------------- No results for input(s): DDIMER in the last 72 hours. -------------------------------------------------------------------------------------------------------------------  Cardiac Enzymes  Recent Labs Lab 06/07/15 1302  TROPONINI <0.03   ------------------------------------------------------------------------------------------------------------------ Invalid input(s): POCBNP  ---------------------------------------------------------------------------------------------------------------  Urinalysis    Component Value Date/Time   COLORURINE STRAW* 06/07/2015 1249   APPEARANCEUR CLEAR* 06/07/2015 1249   LABSPEC 1.004* 06/07/2015 1249   PHURINE 6.0 06/07/2015 1249   GLUCOSEU NEGATIVE 06/07/2015 1249   HGBUR NEGATIVE 06/07/2015 1249   BILIRUBINUR NEGATIVE 06/07/2015 1249   KETONESUR NEGATIVE 06/07/2015 1249   PROTEINUR  NEGATIVE 06/07/2015 1249   NITRITE NEGATIVE 06/07/2015 1249   LEUKOCYTESUR NEGATIVE 06/07/2015 1249     RADIOLOGY: Dg Chest 2 View  06/07/2015   CLINICAL DATA:  Fever and congestion for 1 week, history of chronic lung disease EXAM: CHEST  2 VIEW COMPARISON:  03/04/2015, 12/30/2014 FINDINGS: Heart size upper normal and stable. Stable mild elevation of the left diaphragm. Mild diffuse interstitial prominence similar to 12/30/2014. IMPRESSION: Mild interstitial lung disease, chronic. Electronically Signed   By: Esperanza Heir M.D.   On: 06/07/2015 13:37    IMPRESSION AND PLAN:  * Hyperkalemia  Acute on chronic renal failure    Patient is on multiple diuretics and on spironolactone, I will hold it for now.  I will fluids and Kayexalate given by ER physician, continue monitoring. Follow tomorrow morning.  * Uncontrolled hypertension  She also received 1-2 L of bolus by ER physician for her slight worsening in renal function.  I will hold her furosemide and spironolactone, continue amlodipine.  I will give IV hydralazine as needed to control the blood pressure.  * Hypothyroidism  Continue levothyroxine.  * Chronic diastolic congestive heart failure  Currently no exacerbation symptoms, continue watching for that as we are holding diuretics.   * Rheumatoid arthritis  Continue home medications. No acute exacerbation.    All the records are reviewed and case discussed with ED provider. Management plans discussed with the patient, family and they are in agreement.  CODE STATUS:DO NOT RESUSCITATE  Code Status History    Date Active Date Inactive Code Status Order ID Comments User Context   03/04/2015  9:31 AM 03/06/2015  3:44 PM DNR [id number]  Ihor Austin, MD Inpatient   08/13/2014 10:12 AM 08/14/2014  5:12 PM DNR [id number]  Auburn Bilberry, MD Inpatient   08/10/2014  8:18 PM 08/13/2014 10:12 AM Full Code [id number]  Wyatt Haste, MD ED    Questions for Most Recent Historical Code Status (Order [id number])    Question Answer Comment   In the event of cardiac or respiratory ARREST Do not call a "code blue"    In the event of cardiac  or respiratory ARREST Do not perform Intubation, CPR, defibrillation or ACLS    In the event of cardiac or respiratory ARREST Use medication by any route, position, wound care, and other measures to relive pain and suffering. May use oxygen, suction and manual treatment of airway obstruction as needed for comfort.        TOTAL TIME TAKING CARE OF THIS PATIENT: 50 minutes.    Altamese Dilling M.D on 06/07/2015   Between 7am to 6pm - Pager - [phone]  After 6pm go to [email or web address] - password [email or web address] [facility]  [facility]  Office  [phone]  CC: Primary care physician; Leanna Sato, MD   Note: This dictation was prepared with Dragon dictation along with smaller phrase technology. Any transcriptional errors that result from this process are unintentional.

## 2015-06-07 NOTE — ED Provider Notes (Signed)
[facility] Emergency Department Provider Note  ____________________________________________  Time seen: 12:40 PM  I have reviewed the triage vital signs and the nursing notes.   HISTORY  Chief Complaint Respiratory Distress Level 5 caveat:  Portions of the history and physical were unable to be obtained due to the patient's acute illness, and the patient is a poor historian    HPI Karen Bryan is a 82 [facility] and fever over the past week. Patient also reports being fatigued. Denies chest pain or shortness of breath. Complains of constipation.     Past Medical History  Diagnosis Date  . COPD (chronic obstructive pulmonary disease) (HCC)   . CHF (congestive heart failure) (HCC)   . Hypertension   . Bilateral pneumonia   . Chronic respiratory failure (HCC)   . Bilateral pleural effusion      Patient Active Problem List   Diagnosis Date Noted  . Fall 03/04/2015  . Fluid excess 03/04/2015  . CHF (congestive heart failure) (HCC) 03/04/2015  . Cough 10/03/2014  . Dyspnea 10/03/2014  . ILD (interstitial lung disease) (HCC) 10/03/2014  . COPD exacerbation (HCC) 09/26/2014  . Hard of hearing 09/26/2014  . Hypertensive urgency 08/10/2014  . Acute on chronic diastolic heart failure (HCC) 08/10/2014  . Hypertension, essential 08/10/2014  . Rheumatoid arthritis (HCC) 08/10/2014  . COPD (chronic obstructive pulmonary disease) (HCC) 08/10/2014     Past Surgical History  Procedure Laterality Date  . Hip surgery       Current Outpatient Rx  Name  Route  Sig  Dispense  Refill  . ENSURE (ENSURE)   Oral   Take 240 mLs by mouth daily.         . furosemide (LASIX) 20 MG tablet   Oral   Take 60-80 mg by mouth daily. Take 3 tablets by mouth daily in the morning and 1 tablet by mouth daily at 12 noon         . hydroxychloroquine (PLAQUENIL) 200 MG tablet   Oral    Take 200 mg by mouth daily.         . hydroxypropyl methylcellulose / hypromellose (ISOPTO TEARS / GONIOVISC) 2.5 % ophthalmic solution   Both Eyes   Place 1 drop into both eyes daily as needed for dry eyes. Place 1 drop into each eye every daily as needed for dry eyes         . levothyroxine (SYNTHROID, LEVOTHROID) 50 MCG tablet   Oral   Take 50 mcg by mouth daily before breakfast.         . losartan (COZAAR) 50 MG tablet   Oral   Take 50 mg by mouth daily.         . Menthol, Topical Analgesic, (BENGAY VANISHING SCENT EX)   Apply externally   Apply 1 application topically 4 (four) times daily as needed.          Marland Kitchen PARoxetine (PAXIL) 10 MG tablet   Oral   Take 10 mg by mouth daily.         Marland Kitchen spironolactone (ALDACTONE) 25 MG tablet   Oral   Take 25 mg by mouth daily.            Allergies Tramadol   Family History  Problem Relation Age of Onset  . Pneumonia Mother   . Arthritis Father     Social History Social History  Substance Use Topics  .  Smoking status: Never Smoker   . Smokeless tobacco: Never Used  . Alcohol Use: No    Review of Systems  Constitutional:   No fever or chills. No weight changes Eyes:   No blurry vision or double vision.  ENT:   Positive sore throat. Positive rhinorrhea Cardiovascular:   No chest pain. Respiratory:   No dyspnea positive cough. Gastrointestinal:   Negative for abdominal pain, vomiting and diarrhea.  Positive constipation, unsure of last bowel movement. Genitourinary:   Negative for dysuria or difficulty urinating. Musculoskeletal:   Negative for back pain. No joint swelling or pain. Skin:   Negative for rash. Neurological:   Negative for headaches, focal weakness or numbness. Psychiatric:  No anxiety or depression.   Endocrine:  No changes in energy or sleep difficulty.  10-point ROS otherwise negative.  ____________________________________________   PHYSICAL EXAM:  VITAL SIGNS: ED Triage Vitals   Enc Vitals Group     BP 06/07/15 1249 157/66 mmHg     Pulse Rate 06/07/15 1249 71     Resp 06/07/15 1249 18     Temp 06/07/15 1249 97.8 F (36.6 C)     Temp Source 06/07/15 1249 Oral     SpO2 06/07/15 1236 79 %     Weight 06/07/15 1249 140 lb (63.504 kg)     Height 06/07/15 1249 5' (1.524 m)     Head Cir --      Peak Flow --      Pain Score --      Pain Loc --      Pain Edu? --      Excl. in GC? --     Vital signs reviewed, nursing assessments reviewed.   Constitutional:   Alert and oriented. Well appearing and in no distress. Eyes:   No scleral icterus. No conjunctival pallor. PERRL. EOMI ENT   Head:   Normocephalic and atraumatic.   Nose:   No rhinnorhea. No septal hematoma   Mouth/Throat:   Dry mucous membranes, no pharyngeal erythema. No peritonsillar mass.    Neck:   No stridor. No SubQ emphysema. No meningismus. Hematological/Lymphatic/Immunilogical:   No cervical lymphadenopathy. Cardiovascular:   RRR. Symmetric bilateral radial and DP pulses.  No murmurs.  Respiratory:   Diminished air entry diffusely, faint crackles on inspiration diffusely. No wheezing. Gastrointestinal:   Soft and nontender. Non distended. There is no CVA tenderness.  No rebound, rigidity, or guarding. Genitourinary:   deferred Musculoskeletal:   Nontender with normal range of motion in all extremities. No joint effusions.  No lower extremity tenderness.  No edema. Neurologic:   Normal speech and language.  CN 2-10 normal. Motor grossly intact. No gross focal neurologic deficits are appreciated.  Skin:    Skin is warm, dry and intact. No rash noted.  No petechiae, purpura, or bullae. Poor skin turgor Psychiatric:   Mood and affect are normal. ____________________________________________    LABS (pertinent positives/negatives) (all labs ordered are listed, but only abnormal results are displayed) Labs Reviewed  COMPREHENSIVE METABOLIC PANEL - Abnormal; Notable for the following:     Sodium 131 (*)    Potassium 5.8 (*)    Chloride 96 (*)    Glucose, Bld 121 (*)    BUN 44 (*)    Creatinine, Ser 1.54 (*)    Calcium 8.8 (*)    AST 14 (*)    ALT 7 (*)    GFR calc non Af Amer 28 (*)    GFR calc Af Amer 32 (*)  All other components within normal limits  URINALYSIS COMPLETEWITH MICROSCOPIC ([facility] ONLY) - Abnormal; Notable for the following:    Color, Urine STRAW (*)    APPearance CLEAR (*)    Specific Gravity, Urine 1.004 (*)    Bacteria, UA RARE (*)    Squamous Epithelial / LPF 0-5 (*)    All other components within normal limits  RAPID INFLUENZA A&B ANTIGENS ([facility] ONLY)  URINE CULTURE  CULTURE, BLOOD (ROUTINE X 2)  CULTURE, BLOOD (ROUTINE X 2)  LIPASE, BLOOD  TROPONIN I  BLOOD GAS, VENOUS  CBC WITH DIFFERENTIAL/PLATELET  CBC   ____________________________________________   EKG  Interpreted by me Sinus rhythm rate of 69, normal axis. First-degree AV block with a PR interval of about 240 ms. Normal QRS and ST segments and T waves. One PAC on the strip.  ____________________________________________    RADIOLOGY  Chest x-ray consistent with chronic interstitial lung disease, no acute findings.  ____________________________________________   PROCEDURES CRITICAL CARE Performed by: Scotty Court, Kalie Cabral   Total critical care time: 35 minutes  Critical care time was exclusive of separately billable procedures and treating other patients.  Critical care was necessary to treat or prevent imminent or life-threatening deterioration.  Critical care was time spent personally by me on the following activities: development of treatment plan with patient and/or surrogate as well as nursing, discussions with consultants, evaluation of patient's response to treatment, examination of patient, obtaining history from patient or surrogate, ordering and performing treatments and interventions, ordering and review of laboratory studies, ordering and review of  radiographic studies, pulse oximetry and re-evaluation of patient's condition.    PERIPHERAL IV INSERTION BY PHYSICIAN Indication, need for better IV access, multiple failed attempts by nursing staff. Technique, right arm and antecubital fossa prepped with chlorhexidine. Under continuous real-time ultrasound visualization, right antecubital fossa was cannulized with a 20-gauge catheter. Good draw back of dark venous blood and flush. No complications. She tolerated well, EBL 0  ____________________________________________   INITIAL IMPRESSION / ASSESSMENT AND PLAN / ED COURSE  Pertinent labs & imaging results that were available during my care of the patient were reviewed by me and considered in my medical decision making (see chart for details).  Patient presents with severe acute on chronic hypoxic respiratory failure. She appears clinically dehydrated as well despite her congestive heart failure. We'll check labs and chest x-ray and influenza.  ----------------------------------------- 3:19 PM on 06/07/2015 -----------------------------------------  Labs reveal acute renal insufficiency with hyperkalemia hypernatremia and elevated BUN as well CONSISTENT with dehydration. We'll give Kayexalate calcium and bicarbonate. IV fluids, but with her chronic lung disease, CHF this will be tenuous. We'll plan for admission for fluid management and because of her acute hypoxic respiratory failure that was at 79% on her usual 2 L nasal cannula. Currently improved on 4 L.       ____________________________________________   FINAL CLINICAL IMPRESSION(S) / ED DIAGNOSES  Final diagnoses:  Chronic congestive heart failure, unspecified congestive heart failure type (HCC)  Cough  Dehydration  Hyperkalemia  Hyponatremia      Sharman Cheek, MD 06/07/15 1550

## 2015-06-07 NOTE — ED Notes (Signed)
Pt arrived by EMS from [facility]. Per staff pt has had congestion and fever for a week. Pt also states that she has not had a BM in "long time" but unsure how long it has been.

## 2015-06-08 LAB — BASIC METABOLIC PANEL
Anion gap: 6 (ref 5–15)
BUN: 37 mg/dL — ABNORMAL HIGH (ref 6–20)
CALCIUM: 8.8 mg/dL — AB (ref 8.9–10.3)
CO2: 31 mmol/L (ref 22–32)
CREATININE: 1.17 mg/dL — AB (ref 0.44–1.00)
Chloride: 100 mmol/L — ABNORMAL LOW (ref 101–111)
GFR, EST AFRICAN AMERICAN: 45 mL/min — AB (ref >60)
GFR, EST NON AFRICAN AMERICAN: 39 mL/min — AB (ref >60)
Glucose, Bld: 97 mg/dL (ref 65–99)
Potassium: 4.5 mmol/L (ref 3.5–5.1)
SODIUM: 137 mmol/L (ref 135–145)

## 2015-06-08 LAB — BLOOD CULTURE ID PANEL (REFLEXED)
ACINETOBACTER BAUMANNII: NOT DETECTED
CANDIDA ALBICANS: NOT DETECTED
CANDIDA GLABRATA: NOT DETECTED
CANDIDA KRUSEI: NOT DETECTED
Candida parapsilosis: NOT DETECTED
Candida tropicalis: NOT DETECTED
Carbapenem resistance: NOT DETECTED
ENTEROBACTER CLOACAE COMPLEX: NOT DETECTED
ESCHERICHIA COLI: NOT DETECTED
Enterobacteriaceae species: NOT DETECTED
Enterococcus species: NOT DETECTED
HAEMOPHILUS INFLUENZAE: NOT DETECTED
KLEBSIELLA OXYTOCA: NOT DETECTED
Klebsiella pneumoniae: NOT DETECTED
Listeria monocytogenes: NOT DETECTED
Methicillin resistance: NOT DETECTED
NEISSERIA MENINGITIDIS: NOT DETECTED
Proteus species: NOT DETECTED
Pseudomonas aeruginosa: NOT DETECTED
STAPHYLOCOCCUS SPECIES: DETECTED — AB
STREPTOCOCCUS AGALACTIAE: NOT DETECTED
STREPTOCOCCUS PNEUMONIAE: NOT DETECTED
STREPTOCOCCUS PYOGENES: NOT DETECTED
STREPTOCOCCUS SPECIES: NOT DETECTED
Serratia marcescens: NOT DETECTED
Staphylococcus aureus (BCID): DETECTED — AB
Vancomycin resistance: NOT DETECTED

## 2015-06-08 LAB — CBC
HCT: 29.5 % — ABNORMAL LOW (ref 35.0–47.0)
Hemoglobin: 9.8 g/dL — ABNORMAL LOW (ref 12.0–16.0)
MCH: 30.7 pg (ref 26.0–34.0)
MCHC: 33.2 g/dL (ref 32.0–36.0)
MCV: 92.5 fL (ref 80.0–100.0)
PLATELETS: 220 10*3/uL (ref 150–440)
RBC: 3.19 MIL/uL — AB (ref 3.80–5.20)
RDW: 14.6 % — ABNORMAL HIGH (ref 11.5–14.5)
WBC: 4.8 10*3/uL (ref 3.6–11.0)

## 2015-06-08 MED ORDER — CEFAZOLIN SODIUM 1-5 GM-% IV SOLN
1.0000 g | Freq: Two times a day (BID) | INTRAVENOUS | Status: DC
  Administered 2015-06-08 – 2015-06-09 (×3): 1 g via INTRAVENOUS
  Filled 2015-06-08 (×5): qty 50

## 2015-06-08 NOTE — Progress Notes (Addendum)
Patient ID: [name], female   DOB: [birth year], 82 y.o.   MRN: [id number] [facility] [place] [facility]   PATIENT NAME: Karen Bryan    MR#:  [id number]  DATE OF BIRTH:  1919/08/03  SUBJECTIVE:   Came in with weakness and found to have elevated potassium REVIEW OF SYSTEMS:   Review of Systems  Unable to perform ROS: age   Tolerating Diet:yes Tolerating PT: pending  DRUG ALLERGIES:   Allergies  Allergen Reactions  . Tramadol Nausea Only    VITALS:  Blood pressure 183/50, pulse 73, temperature 97.6 F (36.4 C), temperature source Oral, resp. rate 18, height 5' (1.524 m), weight 63.504 kg (140 lb), SpO2 94 %.  PHYSICAL EXAMINATION:   Physical Exam  GENERAL:  82 y.o.-year-old patient lying in the bed with no acute distress.  EYES: Pupils equal, round, reactive to light and accommodation. No scleral icterus. Extraocular muscles intact.  HEENT: Head atraumatic, normocephalic. Oropharynx and nasopharynx clear.  NECK:  Supple, no jugular venous distention. No thyroid enlargement, no tenderness.  LUNGS: Normal breath sounds bilaterally, no wheezing, rales, rhonchi. No use of accessory muscles of respiration.  CARDIOVASCULAR: S1, S2 normal. No murmurs, rubs, or gallops.  ABDOMEN: Soft, nontender, nondistended. Bowel sounds present. No organomegaly or mass.  EXTREMITIES: No cyanosis, clubbing or edema b/l.    NEUROLOGIC: Cranial nerves II through XII are intact. No focal Motor or sensory deficits b/l.   PSYCHIATRIC:  patient is alert and oriented x 3.  SKIN: No obvious rash, lesion, or ulcer.   LABORATORY PANEL:  CBC  Recent Labs Lab 06/08/15 0557  WBC 4.8  HGB 9.8*  HCT 29.5*  PLT 220    Chemistries   Recent Labs Lab 06/07/15 1302 06/08/15 0557  NA 131* 137  K 5.8* 4.5  CL 96* 100*  CO2 24 31  GLUCOSE 121* 97  BUN 44* 37*  CREATININE 1.54* 1.17*  CALCIUM 8.8* 8.8*  AST 14*  --   ALT 7*  --   ALKPHOS 85  --   BILITOT  0.4  --    Cardiac Enzymes  Recent Labs Lab 06/07/15 1302  TROPONINI <0.03   RADIOLOGY:  Dg Chest 2 View  06/07/2015  CLINICAL DATA:  Fever and congestion for 1 week, history of chronic lung disease EXAM: CHEST  2 VIEW COMPARISON:  03/04/2015, 12/30/2014 FINDINGS: Heart size upper normal and stable. Stable mild elevation of the left diaphragm. Mild diffuse interstitial prominence similar to 12/30/2014. IMPRESSION: Mild interstitial lung disease, chronic. Electronically Signed   By: Esperanza Heir M.D.   On: 06/07/2015 13:37   ASSESSMENT AND PLAN:  82 y.o. female with a known history of COPD, CHF, hypertension, bilateral pneumonia, chronic respiratory failure required 2 dL oxygen holder, bilateral pleural effusion- lives in [facility], and at baseline walks with a Maltz, has some hearing deficit but no dementia  * Hyperkalemia Acute on chronic renal failure Came in with potassium of 5.8. Potassium is down to 4.5. Patient was clinically dehydrated as well. Patient is on multiple diuretics and on spironolactone, I will hold it for now.  * Uncontrolled hypertension She also received 1-2 L of bolus by ER physician for her slight worsening in renal function. will hold her furosemide and spironolactone, continue amlodipine.  will give IV hydralazine as needed to control the blood pressure.  * Hypothyroidism Continue levothyroxine.  * Chronic diastolic congestive heart failure Currently no exacerbation symptoms, continue watching for  that as we are holding diuretics.  * Rheumatoid arthritis Continue home medications. No acute exacerbation.  *1 out of 2 positive blood cultures staph aureus. No rash or cellulitis noted. However patient has several skin scabs likely due to previous injury. Given staph aureus positive discussed with pharmacy will start her on cefazolin 1 g twice a day and monitor clinically.  Case discussed with Care Management/Social  Worker. Management plans discussed with the patient, family and they are in agreement.  CODE STATUS: DO NOT RESUSCITATE  DVT Prophylaxis: Lovenox  TOTAL TIME TAKING CARE OF THIS PATIENT: 35 minutes.  >50% time spent on counselling and coordination of care  POSSIBLE D/C IN one to 2 DAYS, DEPENDING ON CLINICAL CONDITION.  Note: This dictation was prepared with Dragon dictation along with smaller phrase technology. Any transcriptional errors that result from this process are unintentional.  Kairon Shock M.D on 06/08/2015 at 2:47 PM  Between 7am to 6pm - Pager - [phone]  After 6pm go to [email or web address] - password [email or web address] [facility]  [facility]  Office  [phone]  CC: Primary care physician; Leanna Sato, MD

## 2015-06-08 NOTE — Progress Notes (Signed)
Tele - rare PAC;s noted on telemetry.

## 2015-06-08 NOTE — Progress Notes (Addendum)
Pharmacy Antibiotic Note  Karen Bryan is a 82 y.o. female admitted on 06/07/2015 with hyperkalemia found to have 1/2 blood cultures positive for staph aureus mecA negative on BCID.    Plan: Per disucssion with MD Allena Katz, will hold off on ABX for now as patient seems to be clinically stable, afebrile with WBC of 4.8. MD Allena Katz will continue to watch and assess for need of ABX.   03/05 1020: MD Allena Katz would like to initiate ABX therapy. Recommend cefazolin 2g IV q8hrs per BCID protocol due to staph aureus mecA negative, however, due to renal function will decrease dose.  Initiate cefazolin 1g q12hr based on CrCl 25 ml/min.  Pharmacy will continue to monitor.   Height: 5' (152.4 cm) Weight: 140 lb (63.504 kg) IBW/kg (Calculated) : 45.5  Temp (24hrs), Avg:97.9 F (36.6 C), Min:97.4 F (36.3 C), Max:98.2 F (36.8 C)   Recent Labs Lab 06/07/15 1302 06/07/15 1450 06/08/15 0557  WBC  --  7.5 4.8  CREATININE 1.54*  --   --     Estimated Creatinine Clearance: 19 mL/min (by C-G formula based on Cr of 1.54).    Allergies  Allergen Reactions  . Tramadol Nausea Only    Antimicrobials this admission: Anti-infectives    Start     Dose/Rate Route Frequency Ordered Stop   06/07/15 1815  hydroxychloroquine (PLAQUENIL) tablet 200 mg     200 mg Oral Daily 06/07/15 1800        Microbiology results: Results for orders placed or performed during the hospital encounter of 06/07/15  Rapid Influenza A&B Antigens ([facility] only)     Status: None   Collection Time: 06/07/15  1:02 PM  Result Value Ref Range Status   Influenza A ([facility]) NEGATIVE NEGATIVE Final   Influenza B ([facility]) NEGATIVE NEGATIVE Final  Culture, blood (routine x 2)     Status: None (Preliminary result)   Collection Time: 06/07/15  2:50 PM  Result Value Ref Range Status   Specimen Description BLOOD RIGHT ANTECUBITAL  Final   Special Requests   Final    BOTTLES DRAWN AEROBIC AND ANAEROBIC  AER 5CC ANA 3CC   Culture NO GROWTH <  24 HOURS  Final   Report Status PENDING  Incomplete  Culture, blood (routine x 2)     Status: None (Preliminary result)   Collection Time: 06/07/15  6:19 PM  Result Value Ref Range Status   Specimen Description BLOOD LEFT ANTECUBITAL  Final   Special Requests BOTTLES DRAWN AEROBIC AND ANAEROBIC  1CC  Final   Culture  Setup Time   Final    GRAM POSITIVE COCCI IN CLUSTERS AEROBIC BOTTLE ONLY CRITICAL RESULT CALLED TO, READ BACK BY AND VERIFIED WITH: Roque Cash 06/08/2015 0739 BY J RAZZAKSUAREZ,MT    Culture   Final    GRAM POSITIVE COCCI IN CLUSTERS AEROBIC BOTTLE ONLY IDENTIFIED AS STAPHYLOCOCCUS AUREUS SUSCEPTIBILITIES TO FOLLOW    Report Status PENDING  Incomplete  Blood Culture ID Panel (Reflexed)     Status: Abnormal   Collection Time: 06/07/15  6:19 PM  Result Value Ref Range Status   Enterococcus species NOT DETECTED NOT DETECTED Final   Vancomycin resistance NOT DETECTED NOT DETECTED Final   Listeria monocytogenes NOT DETECTED NOT DETECTED Final   Staphylococcus species DETECTED (A) NOT DETECTED Corrected    Comment: CRITICAL RESULT CALLED TO, READ BACK BY AND VERIFIED WITH: Roque Cash 06/08/2015 0739 BY J RAZZAKSUAREZ,MT CORRECTED ON 03/05 AT 0746: PREVIOUSLY REPORTED AS DETECTED  Staphylococcus aureus DETECTED (A) NOT DETECTED Final    Comment: CRITICAL RESULT CALLED TO, READ BACK BY AND VERIFIED WITH: Roque Cash 06/08/2015 0739 BY J RAZZAKSUAREZ,MT    Methicillin resistance NOT DETECTED NOT DETECTED Final   Streptococcus species NOT DETECTED NOT DETECTED Final   Streptococcus agalactiae NOT DETECTED NOT DETECTED Final   Streptococcus pneumoniae NOT DETECTED NOT DETECTED Final   Streptococcus pyogenes NOT DETECTED NOT DETECTED Final   Acinetobacter baumannii NOT DETECTED NOT DETECTED Final   Enterobacteriaceae species NOT DETECTED NOT DETECTED Final   Enterobacter cloacae complex NOT DETECTED NOT DETECTED Final   Escherichia coli NOT DETECTED NOT  DETECTED Final   Klebsiella oxytoca NOT DETECTED NOT DETECTED Final   Klebsiella pneumoniae NOT DETECTED NOT DETECTED Final   Proteus species NOT DETECTED NOT DETECTED Final   Serratia marcescens NOT DETECTED NOT DETECTED Final   Carbapenem resistance NOT DETECTED NOT DETECTED Final   Haemophilus influenzae NOT DETECTED NOT DETECTED Final   Neisseria meningitidis NOT DETECTED NOT DETECTED Final   Pseudomonas aeruginosa NOT DETECTED NOT DETECTED Final   Candida albicans NOT DETECTED NOT DETECTED Final   Candida glabrata NOT DETECTED NOT DETECTED Final   Candida krusei NOT DETECTED NOT DETECTED Final   Candida parapsilosis NOT DETECTED NOT DETECTED Final   Candida tropicalis NOT DETECTED NOT DETECTED Final  MRSA PCR Screening     Status: None   Collection Time: 06/07/15  6:25 PM  Result Value Ref Range Status   MRSA by PCR NEGATIVE NEGATIVE Final    Comment:        The GeneXpert MRSA Assay (FDA approved for NASAL specimens only), is one component of a comprehensive MRSA colonization surveillance program. It is not intended to diagnose MRSA infection nor to guide or monitor treatment for MRSA infections.      Thank you for allowing pharmacy to be a part of this patient's care.  Cy Blamer 06/08/2015 7:46 AM

## 2015-06-08 NOTE — Clinical Social Work Note (Signed)
Clinical Social Work Assessment  Patient Details  Name: Karen Bryan MRN: [id number] Date of Birth: June 01, 1919  Date of referral:  06/08/15               Reason for consult:  Facility Placement                Permission sought to share information with:  Family Supports Permission granted to share information::  Yes, Verbal Permission Granted  Name::      Nusaybah Ivie (son) Raphael Espe (daughter))  Agency::     Relationship::   (Daughter Sherri and Son Joseph)  Contact Information:   Pandora Leiter Garnet Koyanagi [phone] Daughter Sherri [phone])  Housing/Transportation Living arrangements for the past 2 months:  [place] of Information:  Adult Children, Patient Patient Interpreter Needed:  None Criminal Activity/Legal Involvement Pertinent to Current Situation/Hospitalization:  No - Comment as needed Significant Relationships:  Adult Children Lives with:  Facility Resident Do you feel safe going back to the place where you live?  Yes Need for family participation in patient care:  Yes (Comment)  Care giving concerns:  Patient is a resident at [facility]   Social Worker assessment / plan:  Holiday representative (CSW) met with patient. Patient was alert and oriented and laying in the bed. CSW introduced self and explained role of CSW department. Patient son Garnet Koyanagi reports family is a current resident at [facility]. The plan is for patient to return once discharged. Patient and family are agreeable to going back to [facility]. Patient has been a resident at [facility] for a year and reports being satisfied with living there.   Employment status:    Nurse, adult, Medicaid In [place] PT Recommendations:  Not assessed at this time Information / Referral to community resources:     Patient/Family's Response to care: Patient and family is happy with care at facility and would like to return once  discharged.  Patient/Family's Understanding of and Emotional Response to Diagnosis, Current Treatment, and Prognosis: Patient amily aware of admitting diagnosis, current treatment and continue medical workup.  Emotional Assessment Appearance:  Appears stated age Attitude/Demeanor/Rapport:    Affect (typically observed):  Calm, Appropriate, Pleasant Orientation:  Oriented to Self Alcohol / Substance use:    Psych involvement (Current and /or in the community):  No (Comment)  Discharge Needs  Concerns to be addressed:  Cognitive Concerns Readmission within the last 30 days:    Current discharge risk:    Barriers to Discharge:  Continued Medical Work up   [facility]	[place], LCSW 06/08/2015, 4:29 PM

## 2015-06-09 LAB — URINE CULTURE

## 2015-06-09 MED ORDER — AMLODIPINE BESYLATE 5 MG PO TABS: 5.0000 mg | ORAL_TABLET | Freq: Every day | ORAL | Status: DC

## 2015-06-09 MED ORDER — FUROSEMIDE 20 MG PO TABS: 20.0000 mg | ORAL_TABLET | Freq: Every day | ORAL | Status: DC

## 2015-06-09 MED ORDER — CEPHALEXIN 500 MG PO CAPS
500.0000 mg | ORAL_CAPSULE | Freq: Two times a day (BID) | ORAL | Status: DC
  Administered 2015-06-09: 500 mg via ORAL
  Filled 2015-06-09: qty 1

## 2015-06-09 MED ORDER — CEPHALEXIN 500 MG PO CAPS: 500.0000 mg | ORAL_CAPSULE | Freq: Two times a day (BID) | ORAL | Status: DC

## 2015-06-09 MED ORDER — ENOXAPARIN SODIUM 40 MG/0.4ML ~~LOC~~ SOLN: 40.0000 mg | SUBCUTANEOUS | Status: DC

## 2015-06-09 MED ORDER — FUROSEMIDE 20 MG PO TABS: 20.0000 mg | ORAL_TABLET | ORAL | Status: DC

## 2015-06-09 NOTE — Progress Notes (Signed)
Clinical Social Worker was informed that patient will be medically ready to discharge to [facility]. Patient and her family are in a agreement with plan. CSW called Liborio Nixon- Admissions at [facility] and Tammy at [facility] to confirm that patient's bed is ready. All discharge information faxed to manually to [facility] [phone] and added to discharge package. DNR added to discharge packet.   Call to patient's son- Floraine Buechler, to inform him that patient would discharge to [facility] will transport patient at [facility]. RN aware of above  Woodroe Mode, MSW, LCSW-A Clinical Social Work Department [phone]

## 2015-06-09 NOTE — Care Management (Signed)
Order now present for home health SN and PT.  Found that patient has home 02 at the facility and is currently followed by a home health agency.  Facility not sure which agency.  This CM referred patient to "[facility]" in December.  Left message with agency to see if patient is currently open.  Found that patient is currently followed by [facility] PT and OT.  Notified [place] of the admission and discharge.  Obtained home health order for resumption of SN PT and OT

## 2015-06-09 NOTE — Discharge Summary (Addendum)
[facility] - [facility] at [facility]   PATIENT NAME: Karen Bryan    MR#:  [id number]  DATE OF BIRTH:  Apr 28, 1919  DATE OF ADMISSION:  06/07/2015 ADMITTING PHYSICIAN: Altamese Dilling, MD  DATE OF DISCHARGE: 06/09/15  PRIMARY CARE PHYSICIAN: Leanna Sato, MD    ADMISSION DIAGNOSIS:  Cough [R05] Dehydration [E86.0] Hyperkalemia [E87.5] Hyponatremia [E87.1] Chronic congestive heart failure, unspecified congestive heart failure type (HCC) [I50.9]  DISCHARGE DIAGNOSIS:  Acute hyperkalemia resolved likely due to polypharmacy. One out of 2 positive blood culture with staph aureus likely due to skin infection/scabs Dehydration resolved  SECONDARY DIAGNOSIS:   Past Medical History  Diagnosis Date  . COPD (chronic obstructive pulmonary disease) (HCC)   . CHF (congestive heart failure) (HCC)   . Hypertension   . Bilateral pneumonia   . Chronic respiratory failure (HCC)   . Bilateral pleural effusion     HOSPITAL COURSE:  82 y.o. female with a known history of COPD, CHF, hypertension, bilateral pneumonia, chronic respiratory failure required 2 dL oxygen holder, bilateral pleural effusion- lives in [facility], and at baseline walks with a [place], has some hearing deficit but no dementia  * Hyperkalemia Acute on chronic renal failure Came in with potassium of 5.8. Potassium is down to 4.5. Patient was clinically dehydrated as well. Patient is on multiple diuretics and on spironolactone will hold it for now. Patient's Lasix has been turned down to 20 mg by mouth qod. Her spinal electrode has been discontinued as no indication for her to be on it. Her echo reviewed from last year EF of 60-65% not sure why she was on spironolactone  * Uncontrolled hypertension She also received 1-2 L of bolus by ER physician for her slight worsening in renal function. will hold her spironolactone, continue amlodipine at 5 mg daily. Continue  losartan. And low-dose Lasix will give IV hydralazine as needed to control the blood pressure.  * Hypothyroidism Continue levothyroxine.  * Chronic diastolic congestive heart failure Currently no exacerbation symptoms -Patient is euvolemic. Her Lasix is changed to 20 mg qod. She was taking very high doses of Lasix at home not sure why.  * Rheumatoid arthritis Continue home medications. No acute exacerbation.  *1 out of 2 positive blood cultures staph aureus. No rash or cellulitis noted. However patient has several skin scabs likely due to previous injury. Given staph aureus positive discussed with pharmacy will start her on cefazolin 1 g twice a day and monitor clinically. -Changed to by mouth Keflex for 7 days. Patient is clinically stable.  We'll have physical therapy see patient. She will be discharged back to [facility].   CONSULTS OBTAINED:     DRUG ALLERGIES:   Allergies  Allergen Reactions  . Tramadol Nausea Only    DISCHARGE MEDICATIONS:   Current Discharge Medication List    START taking these medications   Details  cephALEXin (KEFLEX) 500 MG capsule Take 1 capsule (500 mg total) by mouth every 12 (twelve) hours. Qty: 12 capsule, Refills: 0      CONTINUE these medications which have CHANGED   Details  amLODipine (NORVASC) 5 MG tablet Take 1 tablet (5 mg total) by mouth daily. Qty: 30 tablet, Refills: 2    furosemide (LASIX) 20 MG tablet Take 1 tablet (20 mg total) by mouth every other day. Qty: 30 tablet, Refills: 1      CONTINUE these medications which have NOT CHANGED   Details  acetaminophen (TYLENOL) 500  MG tablet Take 500 mg by mouth 3 (three) times daily. *Not to exceed 3gm in 24 hours*    Emollient (CETAPHIL MOISTURIZING EX) Apply 1 application topically 2 (two) times daily. Apply cream to bilateral legs until resolved.    ENSURE (ENSURE) Take 240 mLs by mouth daily.    hydroxychloroquine (PLAQUENIL) 200 MG tablet Take 200 mg  by mouth daily.    hydroxypropyl methylcellulose / hypromellose (ISOPTO TEARS / GONIOVISC) 2.5 % ophthalmic solution Place 1 drop into both eyes daily as needed for dry eyes.     levothyroxine (SYNTHROID, LEVOTHROID) 50 MCG tablet Take 50 mcg by mouth daily before breakfast.    losartan (COZAAR) 50 MG tablet Take 50 mg by mouth daily.    magnesium sulfate (EPSOM SALT) GRAN Take 1 g by mouth daily. Use 1 cup to soak foot.    Menthol, Topical Analgesic, (BENGAY VANISHING SCENT EX) Apply 1 application topically 4 (four) times daily.     PARoxetine (PAXIL) 10 MG tablet Take 5 mg by mouth daily. X 30 days. Then stop.      STOP taking these medications     spironolactone (ALDACTONE) 25 MG tablet         If you experience worsening of your admission symptoms, develop shortness of breath, life threatening emergency, suicidal or homicidal thoughts you must seek medical attention immediately by calling 911 or calling your MD immediately  if symptoms less severe.  You Must read complete instructions/literature along with all the possible adverse reactions/side effects for all the Medicines you take and that have been prescribed to you. Take any new Medicines after you have completely understood and accept all the possible adverse reactions/side effects.   Please note  You were cared for by a hospitalist during your hospital stay. If you have any questions about your discharge medications or the care you received while you were in the hospital after you are discharged, you can call the unit and asked to speak with the hospitalist on call if the hospitalist that took care of you is not available. Once you are discharged, your primary care physician will handle any further medical issues. Please note that NO REFILLS for any discharge medications will be authorized once you are discharged, as it is imperative that you return to your primary care physician (or establish a relationship with a primary care  physician if you do not have one) for your aftercare needs so that they can reassess your need for medications and monitor your lab values. Today   SUBJECTIVE   Weight heart on hearing. Complains of weakness. She is sitting out in the recliner chair.  VITAL SIGNS:  Blood pressure 172/45, pulse 71, temperature 98.3 F (36.8 C), temperature source Oral, resp. rate 18, height 5' (1.524 m), weight 63.504 kg (140 lb), SpO2 98 %.  I/O:    Intake/Output Summary (Last 24 hours) at 06/09/15 1158 Last data filed at 06/09/15 1030  Gross per 24 hour  Intake    960 ml  Output   1425 ml  Net   -465 ml    PHYSICAL EXAMINATION:  GENERAL:  82 y.o.-year-old patient lying in the bed with no acute distress.  EYES: Pupils equal, round, reactive to light and accommodation. No scleral icterus. Extraocular muscles intact.  HEENT: Head atraumatic, normocephalic. Oropharynx and nasopharynx clear.  NECK:  Supple, no jugular venous distention. No thyroid enlargement, no tenderness.  LUNGS: Normal breath sounds bilaterally, no wheezing, rales,rhonchi or crepitation. No use of accessory  muscles of respiration.  CARDIOVASCULAR: S1, S2 normal. No murmurs, rubs, or gallops.  ABDOMEN: Soft, non-tender, non-distended. Bowel sounds present. No organomegaly or mass.  EXTREMITIES: No pedal edema, cyanosis, or clubbing.  NEUROLOGIC: Cranial nerves II through XII are intact. Muscle strength 5/5 in all extremities. Sensation intact. Gait not checked.  PSYCHIATRIC: The patient is alert and oriented x 3.  SKIN: No obvious rash, lesion, or ulcer.   DATA REVIEW:   CBC   Recent Labs Lab 06/08/15 0557  WBC 4.8  HGB 9.8*  HCT 29.5*  PLT 220    Chemistries   Recent Labs Lab 06/07/15 1302 06/08/15 0557  NA 131* 137  K 5.8* 4.5  CL 96* 100*  CO2 24 31  GLUCOSE 121* 97  BUN 44* 37*  CREATININE 1.54* 1.17*  CALCIUM 8.8* 8.8*  AST 14*  --   ALT 7*  --   ALKPHOS 85  --   BILITOT 0.4  --     Microbiology  Results   Recent Results (from the past 240 hour(s))  Urine culture     Status: None   Collection Time: 06/07/15 12:49 PM  Result Value Ref Range Status   Specimen Description URINE, RANDOM  Final   Special Requests NONE  Final   Culture MULTIPLE SPECIES PRESENT, SUGGEST RECOLLECTION  Final   Report Status 06/09/2015 FINAL  Final  Rapid Influenza A&B Antigens ([facility] only)     Status: None   Collection Time: 06/07/15  1:02 PM  Result Value Ref Range Status   Influenza A ([facility]) NEGATIVE NEGATIVE Final   Influenza B ([facility]) NEGATIVE NEGATIVE Final  Culture, blood (routine x 2)     Status: None (Preliminary result)   Collection Time: 06/07/15  2:50 PM  Result Value Ref Range Status   Specimen Description BLOOD RIGHT ANTECUBITAL  Final   Special Requests   Final    BOTTLES DRAWN AEROBIC AND ANAEROBIC  AER 5CC ANA 3CC   Culture NO GROWTH 2 DAYS  Final   Report Status PENDING  Incomplete  Culture, blood (routine x 2)     Status: None (Preliminary result)   Collection Time: 06/07/15  6:19 PM  Result Value Ref Range Status   Specimen Description BLOOD LEFT ANTECUBITAL  Final   Special Requests BOTTLES DRAWN AEROBIC AND ANAEROBIC  1CC  Final   Culture  Setup Time   Final    GRAM POSITIVE COCCI IN CLUSTERS AEROBIC BOTTLE ONLY CRITICAL RESULT CALLED TO, READ BACK BY AND VERIFIED WITH: Roque Cash 06/08/2015 0739 BY J RAZZAKSUAREZ,MT    Culture   Final    STAPHYLOCOCCUS AUREUS AEROBIC BOTTLE ONLY SUSCEPTIBILITIES TO FOLLOW    Report Status PENDING  Incomplete  Blood Culture ID Panel (Reflexed)     Status: Abnormal   Collection Time: 06/07/15  6:19 PM  Result Value Ref Range Status   Enterococcus species NOT DETECTED NOT DETECTED Final   Vancomycin resistance NOT DETECTED NOT DETECTED Final   Listeria monocytogenes NOT DETECTED NOT DETECTED Final   Staphylococcus species DETECTED (A) NOT DETECTED Corrected    Comment: CRITICAL RESULT CALLED TO, READ BACK BY AND VERIFIED  WITH: Roque Cash 06/08/2015 0739 BY J RAZZAKSUAREZ,MT CORRECTED ON 03/05 AT 0746: PREVIOUSLY REPORTED AS DETECTED    Staphylococcus aureus DETECTED (A) NOT DETECTED Final    Comment: CRITICAL RESULT CALLED TO, READ BACK BY AND VERIFIED WITH: Roque Cash 06/08/2015 0739 BY J RAZZAKSUAREZ,MT    Methicillin resistance NOT DETECTED NOT DETECTED Final  Streptococcus species NOT DETECTED NOT DETECTED Final   Streptococcus agalactiae NOT DETECTED NOT DETECTED Final   Streptococcus pneumoniae NOT DETECTED NOT DETECTED Final   Streptococcus pyogenes NOT DETECTED NOT DETECTED Final   Acinetobacter baumannii NOT DETECTED NOT DETECTED Final   Enterobacteriaceae species NOT DETECTED NOT DETECTED Final   Enterobacter cloacae complex NOT DETECTED NOT DETECTED Final   Escherichia coli NOT DETECTED NOT DETECTED Final   Klebsiella oxytoca NOT DETECTED NOT DETECTED Final   Klebsiella pneumoniae NOT DETECTED NOT DETECTED Final   Proteus species NOT DETECTED NOT DETECTED Final   Serratia marcescens NOT DETECTED NOT DETECTED Final   Carbapenem resistance NOT DETECTED NOT DETECTED Final   Haemophilus influenzae NOT DETECTED NOT DETECTED Final   Neisseria meningitidis NOT DETECTED NOT DETECTED Final   Pseudomonas aeruginosa NOT DETECTED NOT DETECTED Final   Candida albicans NOT DETECTED NOT DETECTED Final   Candida glabrata NOT DETECTED NOT DETECTED Final   Candida krusei NOT DETECTED NOT DETECTED Final   Candida parapsilosis NOT DETECTED NOT DETECTED Final   Candida tropicalis NOT DETECTED NOT DETECTED Final  MRSA PCR Screening     Status: None   Collection Time: 06/07/15  6:25 PM  Result Value Ref Range Status   MRSA by PCR NEGATIVE NEGATIVE Final    Comment:        The GeneXpert MRSA Assay (FDA approved for NASAL specimens only), is one component of a comprehensive MRSA colonization surveillance program. It is not intended to diagnose MRSA infection nor to guide or monitor treatment  for MRSA infections.     RADIOLOGY:  Dg Chest 2 View  06/07/2015  CLINICAL DATA:  Fever and congestion for 1 week, history of chronic lung disease EXAM: CHEST  2 VIEW COMPARISON:  03/04/2015, 12/30/2014 FINDINGS: Heart size upper normal and stable. Stable mild elevation of the left diaphragm. Mild diffuse interstitial prominence similar to 12/30/2014. IMPRESSION: Mild interstitial lung disease, chronic. Electronically Signed   By: Esperanza Heir M.D.   On: 06/07/2015 13:37     Management plans discussed with the patient, family and they are in agreement.  CODE STATUS:     Code Status Orders        Start     Ordered   06/07/15 1801  Do not attempt resuscitation (DNR)   Continuous    Question Answer Comment  In the event of cardiac or respiratory ARREST Do not call a "code blue"   In the event of cardiac or respiratory ARREST Do not perform Intubation, CPR, defibrillation or ACLS   In the event of cardiac or respiratory ARREST Use medication by any route, position, wound care, and other measures to relive pain and suffering. May use oxygen, suction and manual treatment of airway obstruction as needed for comfort.   Comments confirmed with family.      06/07/15 1800    Code Status History    Date Active Date Inactive Code Status Order ID Comments User Context   03/04/2015  9:31 AM 03/06/2015  3:44 PM DNR [id number]  Ihor Austin, MD Inpatient   08/13/2014 10:12 AM 08/14/2014  5:12 PM DNR [id number]  Auburn Bilberry, MD Inpatient   08/10/2014  8:18 PM 08/13/2014 10:12 AM Full Code [id number]  Wyatt Haste, MD ED      TOTAL TIME TAKING CARE OF THIS PATIENT: 40 minutes.    Caisen Mangas M.D on 06/09/2015 at 11:58 AM  Between 7am to 6pm - Pager - [phone] After 6pm go to [email or web address] -  password [email or web address] [facility]  [facility]  Office  [phone]  CC: Primary care physician; Leanna Sato, MD

## 2015-06-09 NOTE — Evaluation (Signed)
Physical Therapy Evaluation Patient Details Name: Karen Bryan MRN: [id number] DOB: 1920-02-18 Today's Date: 06/09/2015   History of Present Illness  Pt is a 82 y.o. female presenting to hospital with respiratory distress, elevated potassium, worsening renal function, and elevated BP.  PMH includes COPD, CHF, htn, B PNA, chronic respiratory failure requiring 2 L O2, B pleural effusion, and hip surgery.  Clinical Impression  Prior to admission, pt was modified independent ambulating with rollator and had assist with meals, showers, and dressing.  Pt lives at [facility].  Currently pt is independent with bed mobility and CGA with transfers and ambulation 45 feet with RW.  Pt reporting fatigue last 10 feet ambulating limiting distance but able to safely get back to bed with CGA.  Pt would benefit from skilled PT to address noted impairments and functional limitations.  Recommend pt discharge back to ALF with increased services (bedside commode next to bed/chair; either meals delivered to room or w/c transport assist to dining room; and HHPT) when medically appropriate (MD Allena Katz and care management notified).     Follow Up Recommendations Home health PT (increased services at ALF)    Equipment Recommendations  3in1 (PT)    Recommendations for Other Services       Precautions / Restrictions Precautions Precautions: Fall Restrictions Weight Bearing Restrictions: No      Mobility  Bed Mobility Overal bed mobility: Independent             General bed mobility comments: Supine to/from sit  Transfers Overall transfer level: Needs assistance Equipment used: Rolling Pangilinan (2 wheeled) Transfers: Sit to/from [employer] Sit to Stand: Min guard Stand pivot transfers: Min guard (stand step turn bed to/from commode)       General transfer comment: steady without loss of balance  Ambulation/Gait Ambulation/Gait assistance: Min guard Ambulation Distance (Feet):  45 Feet Assistive device: Rolling [place] (2 wheeled)   Gait velocity: decreased   General Gait Details: decreased B step length/foot clearance/heelstrike; steady without loss of balance  Stairs            Wheelchair Mobility    Modified Rankin (Stroke Patients Only)       Balance Overall balance assessment: Needs assistance Sitting-balance support: No upper extremity supported;Feet supported Sitting balance-Leahy Scale: Normal     Standing balance support: Bilateral upper extremity supported (on RW) Standing balance-Leahy Scale: Good                               Pertinent Vitals/Pain Pain Assessment: No/denies pain  Vitals stable and WFL throughout treatment session.    Home Living Family/patient expects to be discharged to:: Assisted living               Home Equipment: Hoefer - 4 wheels      Prior Function Level of Independence: Needs assistance         Comments: Pt reports being independent with rollator and able to ambulate to bathroom and dining room without difficulties.  Pt reports having assist for making meals, cleaning, and dressing (d/t impaired motor function B hands).     Hand Dominance        Extremity/Trunk Assessment   Upper Extremity Assessment:  (fingers flexed B hands and unable to straighten (degenerative disease))           Lower Extremity Assessment: Generalized weakness         Communication   Communication:  HOH (appears to hear better from R ear (compared to L))  Cognition Arousal/Alertness: Awake/alert Behavior During Therapy: WFL for tasks assessed/performed Overall Cognitive Status: Within Functional Limits for tasks assessed                      General Comments   Nursing cleared pt for participation in physical therapy.  Pt agreeable to PT session.    Exercises        Assessment/Plan    PT Assessment Patient needs continued PT services  PT Diagnosis Difficulty  walking;Generalized weakness   PT Problem List Decreased strength;Decreased activity tolerance;Decreased balance;Decreased mobility  PT Treatment Interventions DME instruction;Gait training;Functional mobility training;Therapeutic activities;Therapeutic exercise;Balance training;Patient/family education   PT Goals (Current goals can be found in the Care Plan section) Acute Rehab PT Goals Patient Stated Goal: to go back to ALF PT Goal Formulation: With patient Time For Goal Achievement: 06/23/15 Potential to Achieve Goals: Good    Frequency Min 2X/week   Barriers to discharge   Some increased services at ALF    Co-evaluation               End of Session Equipment Utilized During Treatment: Gait belt Activity Tolerance: Patient limited by fatigue Patient left: in bed;with call bell/phone within reach;with bed alarm set Nurse Communication: Mobility status         Time: 2202-5427 PT Time Calculation (min) (ACUTE ONLY): 30 min   Charges:   PT Evaluation $PT Eval Low Complexity: 1 Procedure PT Treatments $Therapeutic Activity: 8-22 mins   PT G CodesHendricks Limes 06/14/2015, 12:21 PM Hendricks Limes, PT [phone]

## 2015-06-09 NOTE — Care Management Important Message (Signed)
Important Message  Patient Details  Name: Karen Bryan MRN: [id number] Date of Birth: 24-Apr-1919   Medicare Important Message Given:  Yes    [name] A Marika Mahaffy 06/09/2015, 10:24 AM

## 2015-06-09 NOTE — Progress Notes (Addendum)
CSW completed FL2 and requested MD Patel to sign it. MD Allena Katz has submitted discharge orders for patient. CSW contacted Marylu Lund- Admissions Coordinator for [facility] and is awaiting a phone call back. CSW contacted Tammy at [facility]. She reports that patient will be able to be picked up by their transportation after lunch today. CSW will continue to follow and assist.   Woodroe Mode, MSW, LCSW-A Clinical Social Work Department [phone]

## 2015-06-09 NOTE — NC FL2 (Signed)
[place] MEDICAID FL2 LEVEL OF CARE SCREENING TOOL     IDENTIFICATION  Patient Name: Karen Bryan Birthdate: 12-Jul-1919 Sex: female Admission Date (Current Location): 06/07/2015  [facility] and [place] Number:  [address]  ([phone]) Facility and Address:  [facility], [address], [place], [place] [address]      Provider Number: [id number]  Attending Physician Name and Address:  Enedina Finner, MD  Relative Name and Phone Number:   Xavia Kniskern (Son) [phone] Braya Habermehl (Daughter) [phone])    Current Level of Care: Hospital Recommended Level of Care: Assisted Living Facility Prior Approval Number:    Date Approved/Denied:   PASRR Number:  [id number])  Discharge Plan: Other (Comment) ([facility])    Current Diagnoses: Patient Active Problem List   Diagnosis Date Noted  . Hyperkalemia 06/07/2015  . Uncontrolled hypertension 06/07/2015  . Acute on chronic renal failure (HCC) 06/07/2015  . Fall 03/04/2015  . Fluid excess 03/04/2015  . CHF (congestive heart failure) (HCC) 03/04/2015  . Cough 10/03/2014  . Dyspnea 10/03/2014  . ILD (interstitial lung disease) (HCC) 10/03/2014  . COPD exacerbation (HCC) 09/26/2014  . Hard of hearing 09/26/2014  . Hypertensive urgency 08/10/2014  . Acute on chronic diastolic heart failure (HCC) 08/10/2014  . Hypertension, essential 08/10/2014  . Rheumatoid arthritis (HCC) 08/10/2014  . COPD (chronic obstructive pulmonary disease) (HCC) 08/10/2014    Orientation RESPIRATION BLADDER Height & Weight     Self, Place  Normal Continent Weight: 140 lb (63.504 kg) Height:  5' (152.4 cm)  BEHAVIORAL SYMPTOMS/MOOD NEUROLOGICAL BOWEL NUTRITION STATUS   None  None Continent Diet (Heart healthy/carb modified)  AMBULATORY STATUS COMMUNICATION OF NEEDS Skin   Limited Assist Verbally Normal                       Personal Care Assistance Level of Assistance  Bathing, Feeding, Dressing Bathing  Assistance: Limited assistance Feeding assistance: Limited assistance Dressing Assistance: Limited assistance     Functional Limitations Info  Hearing   Hearing Info: Impaired      SPECIAL CARE FACTORS FREQUENCY                       Contractures Contractures Info: Not present    Additional Factors Info  Code Status, Allergies  Code Status Info:  (DNR)    Allergies: Tramadol          Discharge Medications: Current Discharge Medication List    START taking these medications   Details  cephALEXin (KEFLEX) 500 MG capsule Take 1 capsule (500 mg total) by mouth every 12 (twelve) hours. Qty: 12 capsule, Refills: 0      CONTINUE these medications which have CHANGED   Details  amLODipine (NORVASC) 5 MG tablet Take 1 tablet (5 mg total) by mouth daily. Qty: 30 tablet, Refills: 2    furosemide (LASIX) 20 MG tablet Take 1 tablet (20 mg total) by mouth every other day. Qty: 30 tablet, Refills: 1      CONTINUE these medications which have NOT CHANGED   Details  acetaminophen (TYLENOL) 500 MG tablet Take 500 mg by mouth 3 (three) times daily. *Not to exceed 3gm in 24 hours*    Emollient (CETAPHIL MOISTURIZING EX) Apply 1 application topically 2 (two) times daily. Apply cream to bilateral legs until resolved.    ENSURE (ENSURE) Take 240 mLs by mouth daily.    hydroxychloroquine (PLAQUENIL) 200 MG tablet Take 200 mg by mouth daily.  hydroxypropyl methylcellulose / hypromellose (ISOPTO TEARS / GONIOVISC) 2.5 % ophthalmic solution Place 1 drop into both eyes daily as needed for dry eyes.     levothyroxine (SYNTHROID, LEVOTHROID) 50 MCG tablet Take 50 mcg by mouth daily before breakfast.    losartan (COZAAR) 50 MG tablet Take 50 mg by mouth daily.    magnesium sulfate (EPSOM SALT) GRAN Take 1 g by mouth daily. Use 1 cup to soak foot.    Menthol, Topical Analgesic, (BENGAY VANISHING SCENT EX) Apply 1 application  topically 4 (four) times daily.     PARoxetine (PAXIL) 10 MG tablet Take 5 mg by mouth daily. X 30 days. Then stop.      STOP taking these medications     spironolactone (ALDACTONE) 25 MG tablet            Relevant Imaging Results:  Relevant Lab Results:   Additional Information  (SSN: [id number])  Verta Ellen [facility], LCSW

## 2015-06-09 NOTE — Care Management (Signed)
Patient for discharge back to [facility] with home health physical therapy.  [facility] but patient's insurance is not in network with patient [employer]

## 2015-06-09 NOTE — Progress Notes (Signed)
Patient discharged via wheelchair and private vehicle. IV removed and catheter intact. All discharge instructions given and patient verbalizes understanding. Tele removed and returned. Prescriptions given to patient No distress noted.   

## 2015-06-12 LAB — CULTURE, BLOOD (ROUTINE X 2): Culture: NO GROWTH

## 2015-06-16 LAB — BLOOD GAS, VENOUS
ACID-BASE EXCESS: 1.3 mmol/L (ref 0.0–3.0)
BICARBONATE: 27.6 meq/L (ref 21.0–28.0)
O2 SAT: 75.8 %
PCO2 VEN: 50 mmHg (ref 44.0–60.0)
Patient temperature: 37
pH, Ven: 7.35 (ref 7.320–7.430)

## 2015-06-29 ENCOUNTER — Emergency Department: Payer: Medicare Other

## 2015-06-29 ENCOUNTER — Inpatient Hospital Stay
Admission: EM | Admit: 2015-06-29 | Discharge: 2015-07-02 | DRG: 291 | Disposition: A | Discharge status: Home Health | Payer: Medicare Other | Attending: Internal Medicine | Admitting: Internal Medicine

## 2015-06-29 DIAGNOSIS — I214 Non-ST elevation (NSTEMI) myocardial infarction: Secondary | ICD-10-CM

## 2015-06-29 DIAGNOSIS — J9621 Acute and chronic respiratory failure with hypoxia: Secondary | ICD-10-CM | POA: Diagnosis present

## 2015-06-29 DIAGNOSIS — I5033 Acute on chronic diastolic (congestive) heart failure: Secondary | ICD-10-CM | POA: Diagnosis present

## 2015-06-29 DIAGNOSIS — R4182 Altered mental status, unspecified: Secondary | ICD-10-CM | POA: Diagnosis present

## 2015-06-29 DIAGNOSIS — N179 Acute kidney failure, unspecified: Secondary | ICD-10-CM | POA: Diagnosis present

## 2015-06-29 DIAGNOSIS — F419 Anxiety disorder, unspecified: Secondary | ICD-10-CM | POA: Diagnosis present

## 2015-06-29 DIAGNOSIS — R441 Visual hallucinations: Secondary | ICD-10-CM | POA: Diagnosis present

## 2015-06-29 DIAGNOSIS — I509 Heart failure, unspecified: Secondary | ICD-10-CM

## 2015-06-29 DIAGNOSIS — Z8711 Personal history of peptic ulcer disease: Secondary | ICD-10-CM

## 2015-06-29 DIAGNOSIS — J449 Chronic obstructive pulmonary disease, unspecified: Secondary | ICD-10-CM | POA: Diagnosis present

## 2015-06-29 DIAGNOSIS — I110 Hypertensive heart disease with heart failure: Secondary | ICD-10-CM | POA: Diagnosis present

## 2015-06-29 DIAGNOSIS — Z8261 Family history of arthritis: Secondary | ICD-10-CM

## 2015-06-29 DIAGNOSIS — E875 Hyperkalemia: Secondary | ICD-10-CM | POA: Diagnosis present

## 2015-06-29 DIAGNOSIS — R0600 Dyspnea, unspecified: Secondary | ICD-10-CM | POA: Diagnosis not present

## 2015-06-29 DIAGNOSIS — E039 Hypothyroidism, unspecified: Secondary | ICD-10-CM | POA: Diagnosis present

## 2015-06-29 DIAGNOSIS — H9190 Unspecified hearing loss, unspecified ear: Secondary | ICD-10-CM | POA: Diagnosis present

## 2015-06-29 DIAGNOSIS — M069 Rheumatoid arthritis, unspecified: Secondary | ICD-10-CM | POA: Diagnosis present

## 2015-06-29 DIAGNOSIS — Z66 Do not resuscitate: Secondary | ICD-10-CM | POA: Diagnosis present

## 2015-06-29 DIAGNOSIS — F0390 Unspecified dementia without behavioral disturbance: Secondary | ICD-10-CM | POA: Diagnosis present

## 2015-06-29 DIAGNOSIS — Z9981 Dependence on supplemental oxygen: Secondary | ICD-10-CM

## 2015-06-29 DIAGNOSIS — Z79899 Other long term (current) drug therapy: Secondary | ICD-10-CM | POA: Diagnosis not present

## 2015-06-29 LAB — URINALYSIS COMPLETE WITH MICROSCOPIC (ARMC ONLY)
BILIRUBIN URINE: NEGATIVE
GLUCOSE, UA: NEGATIVE mg/dL
Hgb urine dipstick: NEGATIVE
Ketones, ur: NEGATIVE mg/dL
Leukocytes, UA: NEGATIVE
NITRITE: NEGATIVE
Protein, ur: NEGATIVE mg/dL
Specific Gravity, Urine: 1.012 (ref 1.005–1.030)
pH: 5 (ref 5.0–8.0)

## 2015-06-29 LAB — CBC WITH DIFFERENTIAL/PLATELET
Basophils Absolute: 0 10*3/uL (ref 0–0.1)
Basophils Relative: 0 %
EOS ABS: 0.1 10*3/uL (ref 0–0.7)
EOS PCT: 2 %
HCT: 28.9 % — ABNORMAL LOW (ref 35.0–47.0)
Hemoglobin: 9.4 g/dL — ABNORMAL LOW (ref 12.0–16.0)
Lymphocytes Relative: 7 %
Lymphs Abs: 0.5 10*3/uL — ABNORMAL LOW (ref 1.0–3.6)
MCH: 30.9 pg (ref 26.0–34.0)
MCHC: 32.4 g/dL (ref 32.0–36.0)
MCV: 95.5 fL (ref 80.0–100.0)
MONOS PCT: 10 %
Monocytes Absolute: 0.7 10*3/uL (ref 0.2–0.9)
NEUTROS ABS: 5.4 10*3/uL (ref 1.4–6.5)
NEUTROS PCT: 81 %
PLATELETS: 282 10*3/uL (ref 150–440)
RBC: 3.02 MIL/uL — ABNORMAL LOW (ref 3.80–5.20)
RDW: 14.8 % — AB (ref 11.5–14.5)
WBC: 6.7 10*3/uL (ref 3.6–11.0)

## 2015-06-29 LAB — COMPREHENSIVE METABOLIC PANEL
ALK PHOS: 91 U/L (ref 38–126)
ALT: 10 U/L — ABNORMAL LOW (ref 14–54)
ANION GAP: 6 (ref 5–15)
AST: 16 U/L (ref 15–41)
Albumin: 3.4 g/dL — ABNORMAL LOW (ref 3.5–5.0)
BUN: 37 mg/dL — ABNORMAL HIGH (ref 6–20)
CALCIUM: 8.3 mg/dL — AB (ref 8.9–10.3)
CHLORIDE: 105 mmol/L (ref 101–111)
CO2: 25 mmol/L (ref 22–32)
Creatinine, Ser: 1.27 mg/dL — ABNORMAL HIGH (ref 0.44–1.00)
GFR calc non Af Amer: 35 mL/min — ABNORMAL LOW (ref >60)
GFR, EST AFRICAN AMERICAN: 41 mL/min — AB (ref >60)
Glucose, Bld: 105 mg/dL — ABNORMAL HIGH (ref 65–99)
Potassium: 5.5 mmol/L — ABNORMAL HIGH (ref 3.5–5.1)
SODIUM: 136 mmol/L (ref 135–145)
Total Bilirubin: 0.5 mg/dL (ref 0.3–1.2)
Total Protein: 6.8 g/dL (ref 6.5–8.1)

## 2015-06-29 LAB — BRAIN NATRIURETIC PEPTIDE: B NATRIURETIC PEPTIDE 5: 555 pg/mL — AB (ref 0.0–100.0)

## 2015-06-29 LAB — TROPONIN I
TROPONIN I: 0.12 ng/mL — AB (ref <0.031)
Troponin I: 0.14 ng/mL — ABNORMAL HIGH (ref <0.031)
Troponin I: 0.16 ng/mL — ABNORMAL HIGH (ref <0.031)

## 2015-06-29 LAB — TSH
TSH: 3.675 u[IU]/mL (ref 0.350–4.500)
TSH: 3.816 u[IU]/mL (ref 0.350–4.500)

## 2015-06-29 MED ORDER — ONDANSETRON HCL 4 MG PO TABS: 4.0000 mg | ORAL_TABLET | Freq: Four times a day (QID) | ORAL | Status: DC | PRN

## 2015-06-29 MED ORDER — ASPIRIN 81 MG PO CHEW
324.0000 mg | CHEWABLE_TABLET | Freq: Once | ORAL | Status: AC
  Administered 2015-06-29: 324 mg via ORAL
  Filled 2015-06-29: qty 4

## 2015-06-29 MED ORDER — ONDANSETRON HCL 4 MG/2ML IJ SOLN
4.0000 mg | Freq: Four times a day (QID) | INTRAMUSCULAR | Status: DC | PRN
Start: 2015-06-29 — End: 2015-07-02

## 2015-06-29 MED ORDER — ACETAMINOPHEN 325 MG PO TABS
650.0000 mg | ORAL_TABLET | Freq: Four times a day (QID) | ORAL | Status: DC | PRN
Start: 2015-06-29 — End: 2015-07-02
  Administered 2015-06-30 – 2015-07-01 (×2): 650 mg via ORAL
  Filled 2015-06-29 (×3): qty 2

## 2015-06-29 MED ORDER — ENOXAPARIN SODIUM 30 MG/0.3ML ~~LOC~~ SOLN
30.0000 mg | SUBCUTANEOUS | Status: DC
  Administered 2015-06-29 – 2015-07-01 (×3): 30 mg via SUBCUTANEOUS
  Filled 2015-06-29 (×3): qty 0.3

## 2015-06-29 MED ORDER — SODIUM CHLORIDE 0.9% FLUSH
3.0000 mL | Freq: Two times a day (BID) | INTRAVENOUS | Status: DC
  Administered 2015-06-29 – 2015-07-02 (×6): 3 mL via INTRAVENOUS

## 2015-06-29 MED ORDER — SODIUM CHLORIDE 0.9% FLUSH
3.0000 mL | Freq: Two times a day (BID) | INTRAVENOUS | Status: DC
  Administered 2015-06-30: 3 mL via INTRAVENOUS

## 2015-06-29 MED ORDER — PAROXETINE HCL 10 MG PO TABS
5.0000 mg | ORAL_TABLET | Freq: Every day | ORAL | Status: DC
  Administered 2015-06-29 – 2015-07-02 (×4): 5 mg via ORAL
  Filled 2015-06-29: qty 0.5
  Filled 2015-06-29: qty 1
  Filled 2015-06-29: qty 0.5
  Filled 2015-06-29 (×2): qty 1

## 2015-06-29 MED ORDER — SODIUM CHLORIDE 0.9% FLUSH
3.0000 mL | INTRAVENOUS | Status: DC | PRN
  Administered 2015-07-01 – 2015-07-02 (×2): 3 mL via INTRAVENOUS
  Filled 2015-06-29 (×2): qty 3

## 2015-06-29 MED ORDER — ACETAMINOPHEN 650 MG RE SUPP: 650.0000 mg | Freq: Four times a day (QID) | RECTAL | Status: DC | PRN

## 2015-06-29 MED ORDER — POLYVINYL ALCOHOL 1.4 % OP SOLN
1.0000 [drp] | Freq: Every day | OPHTHALMIC | Status: DC | PRN
  Filled 2015-06-29: qty 15

## 2015-06-29 MED ORDER — LEVOTHYROXINE SODIUM 50 MCG PO TABS
50.0000 ug | ORAL_TABLET | Freq: Every day | ORAL | Status: DC
  Administered 2015-06-30 – 2015-07-02 (×3): 50 ug via ORAL
  Filled 2015-06-29 (×3): qty 1

## 2015-06-29 MED ORDER — HYDROXYCHLOROQUINE SULFATE 200 MG PO TABS
200.0000 mg | ORAL_TABLET | Freq: Every day | ORAL | Status: DC
  Administered 2015-06-29 – 2015-07-02 (×4): 200 mg via ORAL
  Filled 2015-06-29 (×4): qty 1

## 2015-06-29 MED ORDER — SODIUM CHLORIDE 0.9 % IV SOLN: 250.0000 mL | INTRAVENOUS | Status: DC | PRN

## 2015-06-29 MED ORDER — FUROSEMIDE 10 MG/ML IJ SOLN
20.0000 mg | Freq: Two times a day (BID) | INTRAMUSCULAR | Status: DC
  Administered 2015-06-29 – 2015-07-02 (×6): 20 mg via INTRAVENOUS
  Filled 2015-06-29 (×2): qty 2
  Filled 2015-06-29: qty 4
  Filled 2015-06-29 (×3): qty 2

## 2015-06-29 MED ORDER — FUROSEMIDE 10 MG/ML IJ SOLN
20.0000 mg | Freq: Once | INTRAMUSCULAR | Status: AC
  Administered 2015-06-29: 20 mg via INTRAVENOUS
  Filled 2015-06-29: qty 4

## 2015-06-29 NOTE — ED Provider Notes (Signed)
[facility] Emergency Department Provider Note  ____________________________________________  Time seen: Approximately 9:07 AM  I have reviewed the triage vital signs and the nursing notes.   HISTORY  Chief Complaint Eye Drainage and Wheezing    HPI Karen Bryan is a 82 y.o. female with known history of COPD, CHF, hypertension, 2L home O2 requirement who presents for evaluation of perceived facial swelling and watery drainage from her eyes at [facility], onset unknown, mild severity. History is obtained partially from the patient and partially from EMS on arrival and from [facility] staff. Per [employer] staff, the patient has been having some drainage from her eyes today and the area around her eyes appeared to be swollen and she has been hallucinating "seeing things that were not there, thinking that the room is flooding, seeing a boy twirling around in the corner" since yesterday. The patient reports that she feels well and does not know why she was sent to the ER. No vomiting, diarrhea, fevers or chills. She has had cough. No recent falls. She denies chest pain. She refused an albuterol neb treatment from EMS in route secondary to wheezing.   Past Medical History  Diagnosis Date  . COPD (chronic obstructive pulmonary disease) (HCC)   . CHF (congestive heart failure) (HCC)   . Hypertension   . Bilateral pneumonia   . Chronic respiratory failure (HCC)   . Bilateral pleural effusion     Patient Active Problem List   Diagnosis Date Noted  . Hyperkalemia 06/07/2015  . Uncontrolled hypertension 06/07/2015  . Acute on chronic renal failure (HCC) 06/07/2015  . Fall 03/04/2015  . Fluid excess 03/04/2015  . CHF (congestive heart failure) (HCC) 03/04/2015  . Cough 10/03/2014  . Dyspnea 10/03/2014  . ILD (interstitial lung disease) (HCC) 10/03/2014  . COPD exacerbation (HCC) 09/26/2014  . Hard of hearing 09/26/2014  . Hypertensive urgency  08/10/2014  . Acute on chronic diastolic heart failure (HCC) 08/10/2014  . Hypertension, essential 08/10/2014  . Rheumatoid arthritis (HCC) 08/10/2014  . COPD (chronic obstructive pulmonary disease) (HCC) 08/10/2014    Past Surgical History  Procedure Laterality Date  . Hip surgery      Current Outpatient Rx  Name  Route  Sig  Dispense  Refill  . acetaminophen (TYLENOL) 500 MG tablet   Oral   Take 500 mg by mouth 3 (three) times daily. *Not to exceed 3gm in 12 hours*         . amLODipine (NORVASC) 5 MG tablet   Oral   Take 1 tablet (5 mg total) by mouth daily.   30 tablet   2   . Emollient (CETAPHIL MOISTURIZING EX)   Apply externally   Apply 1 application topically 2 (two) times daily. Apply cream to bilateral legs until resolved.         Marland Kitchen ENSURE (ENSURE)   Oral   Take 240 mLs by mouth daily.         . furosemide (LASIX) 20 MG tablet   Oral   Take 1 tablet (20 mg total) by mouth every other day. Patient taking differently: Take 20 mg by mouth 2 (two) times daily. **Take at 8 am and 12 noon.**   30 tablet   1   . hydroxychloroquine (PLAQUENIL) 200 MG tablet   Oral   Take 200 mg by mouth daily.         . hydroxypropyl methylcellulose / hypromellose (ISOPTO TEARS / GONIOVISC) 2.5 % ophthalmic solution  Both Eyes   Place 1 drop into both eyes daily as needed for dry eyes.          Marland Kitchen levothyroxine (SYNTHROID, LEVOTHROID) 50 MCG tablet   Oral   Take 50 mcg by mouth daily before breakfast.         . losartan (COZAAR) 50 MG tablet   Oral   Take 50 mg by mouth daily.         . magnesium sulfate (EPSOM SALT) GRAN   Oral   Take 1 g by mouth daily. Use 1 cup to soak foot.         . Menthol, Topical Analgesic, (BENGAY VANISHING SCENT EX)   Apply externally   Apply 1 application topically 4 (four) times daily.          . OXYGEN   Inhalation   Inhale 2 L/min into the lungs continuous.         . cephALEXin (KEFLEX) 500 MG capsule   Oral    Take 1 capsule (500 mg total) by mouth every 12 (twelve) hours.   12 capsule   0   . EXPIRED: PARoxetine (PAXIL) 10 MG tablet   Oral   Take 5 mg by mouth daily. X 30 days. Then stop.           Allergies Tramadol  Family History  Problem Relation Age of Onset  . Pneumonia Mother   . Arthritis Father     Social History Social History  Substance Use Topics  . Smoking status: Never Smoker   . Smokeless tobacco: Never Used  . Alcohol Use: No    Review of Systems Constitutional: No fever/chills Eyes: No visual changes. ENT: No sore throat. Cardiovascular: Denies chest pain. Respiratory: Denies shortness of breath. Gastrointestinal: No abdominal pain.  No nausea, no vomiting.  No diarrhea.  No constipation. Genitourinary: Negative for dysuria. Musculoskeletal: Negative for back pain. Skin: Negative for rash. Neurological: Negative for headaches, focal weakness or numbness.  10-point ROS otherwise negative.  ____________________________________________   PHYSICAL EXAM:  VITAL SIGNS: ED Triage Vitals  Enc Vitals Group     BP 06/29/15 0830 180/54 mmHg     Pulse Rate 06/29/15 0830 72     Resp 06/29/15 0830 20     Temp 06/29/15 0830 98 F (36.7 C)     Temp Source 06/29/15 0830 Oral     SpO2 06/29/15 0827 89 %     Weight 06/29/15 0830 158 lb 8 oz (71.895 kg)     Height --      Head Cir --      Peak Flow --      Pain Score --      Pain Loc --      Pain Edu? --      Excl. in GC? --     Constitutional: Alert and oriented x3. Nontoxic appearing but mildly tachypneic with wheezes audible from the doorway. Eyes: Conjunctivae are normal. PERRL. EOMI. + clear watery. drainage from the eyes bilaterally. Head: Atraumatic. No facial swelling. Nose: No congestion/rhinnorhea. Mouth/Throat: Mucous membranes are moist.  Oropharynx non-erythematous. Neck: No stridor.  Supple without meningismus. Cardiovascular: Normal rate, regular rhythm. Grossly normal heart sounds.   Good peripheral circulation. Respiratory: Mildly increased work of breathing, tachypnea, diffuse expiratory wheeze, crackles in the bases bilaterally. Gastrointestinal: Soft and nontender. No distention.  No CVA tenderness. Genitourinary: deferred Musculoskeletal: 3+ pitting edema bilateral lower extremities. Chronic contractures/deformities of the hands secondary to arthritis. Neurologic:  Normal  speech and language. No gross focal neurologic deficits are appreciated.  Skin:  Skin is warm, dry and intact. No rash noted. Psychiatric: Mood and affect are normal. Speech and behavior are normal.  ____________________________________________   LABS (all labs ordered are listed, but only abnormal results are displayed)  Labs Reviewed  CBC WITH DIFFERENTIAL/PLATELET - Abnormal; Notable for the following:    RBC 3.02 (*)    Hemoglobin 9.4 (*)    HCT 28.9 (*)    RDW 14.8 (*)    Lymphs Abs 0.5 (*)    All other components within normal limits  COMPREHENSIVE METABOLIC PANEL - Abnormal; Notable for the following:    Potassium 5.5 (*)    Glucose, Bld 105 (*)    BUN 37 (*)    Creatinine, Ser 1.27 (*)    Calcium 8.3 (*)    Albumin 3.4 (*)    ALT 10 (*)    GFR calc non Af Amer 35 (*)    GFR calc Af Amer 41 (*)    All other components within normal limits  TROPONIN I - Abnormal; Notable for the following:    Troponin I 0.14 (*)    All other components within normal limits  BRAIN NATRIURETIC PEPTIDE - Abnormal; Notable for the following:    B Natriuretic Peptide 555.0 (*)    All other components within normal limits  URINALYSIS COMPLETEWITH MICROSCOPIC ([facility] ONLY) - Abnormal; Notable for the following:    Color, Urine YELLOW (*)    APPearance CLEAR (*)    Bacteria, UA RARE (*)    Squamous Epithelial / LPF 0-5 (*)    All other components within normal limits   ____________________________________________  EKG  ED ECG REPORT I, Gayla Doss, the attending physician, personally  viewed and interpreted this ECG.   Date: 06/29/2015  EKG Time: 08:36  Rate: 72  Rhythm: normal sinus rhythm  Axis: normal  Intervals:first-degree A-V block   ST&T Change: No acute ST elevation. Q waves in V1, V2.  ____________________________________________  RADIOLOGY  CXR IMPRESSION: Interstitial edema and bilateral pleural effusions consistent with congestive heart failure.  CT head IMPRESSION: 1. Similar findings of advanced atrophy and microvascular ischemic disease without acute intracranial process. 2. Bilateral mastoid air cell effusions, unchanged since the 02/2015 examination. ____________________________________________   PROCEDURES  Procedure(s) performed: None  Critical Care performed: No  ____________________________________________   INITIAL IMPRESSION / ASSESSMENT AND PLAN / ED COURSE  Pertinent labs & imaging results that were available during my care of the patient were reviewed by me and considered in my medical decision making (see chart for details).  Karen Bryan is a 82 y.o. female with known history of COPD, CHF, hypertension, 2L home O2 requirement who presents for evaluation of perceived facial swelling and watery drainage from her eyes at [facility]. She is also been having hallucinations there. On arrival to the emergency department she is in mild respiratory distress with increased work of breathing, diffuse expiratory wheezes as well as crackles at bilateral bases. She also has 2+ pitting edema bilateral lower extremities and I'm concerned her wheeze may be more cardiac in nature and secondary to volume overload. We'll obtain screening labs, EKG, UA, chest x-ray, CT head to rule out intracranial hemorrhage in the setting of her confusion/hallucinations. Anticipate admission.  ----------------------------------------- 11:33 AM on 06/29/2015 ----------------------------------------- Chest x-ray confirms CHF  exacerbation/interstitial edema. Lasix ordered. Labs reviewed. CBC notable for chronic stable anemia. CMP is notable for hyperkalemia, potassium 5.5  however no acute EKG changes. Creatinine mildly elevated at 1.27. Troponin is elevated at 0.14, question NSTEMI versus demand ischemia in the setting of CHF exacerbation. Aspirin ordered.Marland Kitchen BNP is elevated at 555. Case discussed with hospitalist Dr. Allena Katz for admission.  ____________________________________________   FINAL CLINICAL IMPRESSION(S) / ED DIAGNOSES  Final diagnoses:  Acute congestive heart failure, unspecified congestive heart failure type (HCC)  NSTEMI (non-ST elevated myocardial infarction) (HCC)  Hyperkalemia      Gayla Doss, MD 06/29/15 1134

## 2015-06-29 NOTE — Progress Notes (Signed)
MD notified. Pts second troponin is .016 and need clarification of troponin lab orders. Acknowledged and orders to change troponin to every 6 hours. I will continue to assess.

## 2015-06-29 NOTE — ED Notes (Signed)
Patient transported to X-ray 

## 2015-06-29 NOTE — ED Notes (Signed)
Tammy RN unavailable at this time to take report. To return phone call when available. Will call back to give report if no return phone call received.

## 2015-06-29 NOTE — Progress Notes (Signed)
Anticoagulation monitoring(Lovenox):  82 yo  ordered Lovenox 40 mg Q24h  Filed Weights   06/29/15 0830 06/29/15 1326  Weight: 158 lb 8 oz (71.895 kg) 145 lb 1.6 oz (65.817 kg)   BMI  Lab Results  Component Value Date   CREATININE 1.27* 06/29/2015   CREATININE 1.17* 06/08/2015   CREATININE 1.54* 06/07/2015   Estimated Creatinine Clearance: 23.4 mL/min (by C-G formula based on Cr of 1.27). Hemoglobin & Hematocrit     Component Value Date/Time   HGB 9.4* 06/29/2015 1011   HGB 10.1* 03/27/2014 0504   HCT 28.9* 06/29/2015 1011   HCT 31.4* 03/27/2014 0504     Per Protocol for Patient with estCrcl< 30 ml/min and BMI < 40, will transition to Lovenox 30 mg Q24h.      Luisa Hart, PharmD Clinical Pharmacist

## 2015-06-29 NOTE — ED Notes (Signed)
Pt arrives to ER via [facility] from [facility] in [place]. Staff reported to [facility] that pt awoke today with "puffy" eyes, bilateral eye drainage. Upon EMS arrival, pt oxygen saturation 89% on RA with bilateral expiratory wheezing. Pt received 1 duoeneb en route to ER and place don 2L [place]. Pt 99% at that time. Pt on RA at this time with oxygen at 93% upon arrival to ER. Pt denies pain. Expiratory wheezing audible. Hx of CHF. Pt alert and oriented X 4, pt in NAD. RR even and unlabored, shallow.

## 2015-06-29 NOTE — H&P (Signed)
[facility] - [place] at [facility]   PATIENT NAME: Karen Bryan    MR#:  [id number]  DATE OF BIRTH:  [birth year]  DATE OF ADMISSION:  06/29/2015  PRIMARY CARE PHYSICIAN: Leanna Sato, MD   REQUESTING/REFERRING PHYSICIAN: Gayla Doss MD  CHIEF COMPLAINT:   Chief Complaint  Patient presents with  . Eye Drainage  . Wheezing    HISTORY OF PRESENT ILLNESS: Karen Bryan  is a 82 y.o. female with a known history of COPD, congestive heart failure, hypertension, chronic respiratory failure requiring 2 L of oxygen who was recently in the hospital for dehydration and hyperkalemia who was discharged to [facility]. Apparently in the past week patient has been confused. Was seen by her primary care provider and there was some adjustments of her medications. Patient also eats since yesterday was confused. And was noted to have swelling. Therefore she was sent to the ER. Currently her son is at the bedside who provides the history. Patient is very hard of hearing and currently she is a little sleepy. She was noted to have chest x-ray was suggestive congestive heart failure. She is again noticed to have hyperkalemia. PAST MEDICAL HISTORY:   Past Medical History  Diagnosis Date  . COPD (chronic obstructive pulmonary disease) (HCC)   . CHF (congestive heart failure) (HCC)   . Hypertension   . Bilateral pneumonia   . Chronic respiratory failure (HCC)   . Bilateral pleural effusion     PAST SURGICAL HISTORY: Past Surgical History  Procedure Laterality Date  . Hip surgery      SOCIAL HISTORY:  Social History  Substance Use Topics  . Smoking status: Never Smoker   . Smokeless tobacco: Never Used  . Alcohol Use: No    FAMILY HISTORY:  Family History  Problem Relation Age of Onset  . Pneumonia Mother   . Arthritis Father     DRUG ALLERGIES:  Allergies  Allergen Reactions  . Tramadol Nausea Only    REVIEW OF SYSTEMS:   CONSTITUTIONAL: No fever Patient  unable to provide   MEDICATIONS AT HOME:  Prior to Admission medications   Medication Sig Start Date End Date Taking? Authorizing Provider  acetaminophen (TYLENOL) 500 MG tablet Take 500 mg by mouth 3 (three) times daily. *Not to exceed 3gm in 12 hours*   Yes Historical Provider, MD  amLODipine (NORVASC) 5 MG tablet Take 1 tablet (5 mg total) by mouth daily. 06/10/15  Yes Enedina Finner, MD  Emollient (CETAPHIL MOISTURIZING EX) Apply 1 application topically 2 (two) times daily. Apply cream to bilateral legs until resolved.   Yes Historical Provider, MD  ENSURE (ENSURE) Take 240 mLs by mouth daily.   Yes Historical Provider, MD  furosemide (LASIX) 20 MG tablet Take 1 tablet (20 mg total) by mouth every other day. Patient taking differently: Take 20 mg by mouth 2 (two) times daily. **Take at 8 am and 12 noon.** 06/09/15  Yes Enedina Finner, MD  hydroxychloroquine (PLAQUENIL) 200 MG tablet Take 200 mg by mouth daily.   Yes Historical Provider, MD  hydroxypropyl methylcellulose / hypromellose (ISOPTO TEARS / GONIOVISC) 2.5 % ophthalmic solution Place 1 drop into both eyes daily as needed for dry eyes.    Yes Historical Provider, MD  levothyroxine (SYNTHROID, LEVOTHROID) 50 MCG tablet Take 50 mcg by mouth daily before breakfast.   Yes Historical Provider, MD  losartan (COZAAR) 50 MG tablet Take 50 mg by mouth daily.   Yes Historical Provider, MD  magnesium  sulfate (EPSOM SALT) GRAN Take 1 g by mouth daily. Use 1 cup to soak foot.   Yes Historical Provider, MD  Menthol, Topical Analgesic, (BENGAY VANISHING SCENT EX) Apply 1 application topically 4 (four) times daily.    Yes Historical Provider, MD  OXYGEN Inhale 2 L/min into the lungs continuous.   Yes Historical Provider, MD  cephALEXin (KEFLEX) 500 MG capsule Take 1 capsule (500 mg total) by mouth every 12 (twelve) hours. 06/09/15   Enedina Finner, MD  PARoxetine (PAXIL) 10 MG tablet Take 5 mg by mouth daily. X 30 days. Then stop. 05/26/15 06/24/15  Historical Provider,  MD      PHYSICAL EXAMINATION:   VITAL SIGNS: Blood pressure 159/57, pulse 68, temperature 98 F (36.7 C), temperature source Oral, resp. rate 21, weight 71.895 kg (158 lb 8 oz), SpO2 100 %.  GENERAL:  82 y.o.-year-old patient lying in the bed appears chronically ill sleepy EYES: Pupils equal, round, reactive to light and accommodation. No scleral icterus.  HEENT: Head atraumatic, normocephalic. Oropharynx and nasopharynx clear.  NECK:  Supple, no jugular venous distention. No thyroid enlargement, no tenderness.  LUNGS: Bilateral crackles at the bases, no current wheezing or rhonchi. No  accesory muscle usage CARDIOVASCULAR: S1, S2 normal. No murmurs, rubs, or gallops.  ABDOMEN: Soft, nontender, nondistended. Bowel sounds present. No organomegaly or mass.  EXTREMITIES: 2+ EDEMA, NO  cyanosis, or clubbing.  NEUROLOGIC:  Patient currently very sleepy and unable to follow commands  PSYCHIATRIC: SleepY not anxious to depressed SKIN: No obvious rash, lesion, or ulcer.   LABORATORY PANEL:   CBC  Recent Labs Lab 06/29/15 1011  WBC 6.7  HGB 9.4*  HCT 28.9*  PLT 282  MCV 95.5  MCH 30.9  MCHC 32.4  RDW 14.8*  LYMPHSABS 0.5*  MONOABS 0.7  EOSABS 0.1  BASOSABS 0.0   ------------------------------------------------------------------------------------------------------------------  Chemistries   Recent Labs Lab 06/29/15 1011  NA 136  K 5.5*  CL 105  CO2 25  GLUCOSE 105*  BUN 37*  CREATININE 1.27*  CALCIUM 8.3*  AST 16  ALT 10*  ALKPHOS 91  BILITOT 0.5   ------------------------------------------------------------------------------------------------------------------ estimated creatinine clearance is 24.5 mL/min (by C-G formula based on Cr of 1.27). ------------------------------------------------------------------------------------------------------------------ No results for input(s): TSH, T4TOTAL, T3FREE, THYROIDAB in the last 72 hours.  Invalid input(s):  FREET3   Coagulation profile No results for input(s): INR, PROTIME in the last 168 hours. ------------------------------------------------------------------------------------------------------------------- No results for input(s): DDIMER in the last 72 hours. -------------------------------------------------------------------------------------------------------------------  Cardiac Enzymes  Recent Labs Lab 06/29/15 1011  TROPONINI 0.14*   ------------------------------------------------------------------------------------------------------------------ Invalid input(s): POCBNP  ---------------------------------------------------------------------------------------------------------------  Urinalysis    Component Value Date/Time   COLORURINE YELLOW* 06/29/2015 0920   APPEARANCEUR CLEAR* 06/29/2015 0920   LABSPEC 1.012 06/29/2015 0920   PHURINE 5.0 06/29/2015 0920   GLUCOSEU NEGATIVE 06/29/2015 0920   HGBUR NEGATIVE 06/29/2015 0920   BILIRUBINUR NEGATIVE 06/29/2015 0920   KETONESUR NEGATIVE 06/29/2015 0920   PROTEINUR NEGATIVE 06/29/2015 0920   NITRITE NEGATIVE 06/29/2015 0920   LEUKOCYTESUR NEGATIVE 06/29/2015 0920     RADIOLOGY: Dg Chest 2 View  06/29/2015  CLINICAL DATA:  Pt arrives to ER via [facility] from [facility] in [place]. Staff reported to [facility] that pt awoke today with "puffy" eyes, bilateral eye drainage. Expiratory wheezing audible. Hx of CHF, COPD, HTN. EXAM: CHEST  2 VIEW COMPARISON:  06/07/2015 FINDINGS: Mildly enlarged cardiac silhouette. There is new bilateral pleural effusions. There is interstitial edema pattern. Chronic elevation of LEFT hemidiaphragm. Acute osseous  findings. IMPRESSION: Interstitial edema and bilateral pleural effusions consistent with congestive heart failure. Electronically Signed   By: Genevive Bi M.D.   On: 06/29/2015 09:20   Ct Head Wo Contrast  06/29/2015  CLINICAL DATA:  Hallucinations EXAM: CT HEAD WITHOUT  CONTRAST TECHNIQUE: Contiguous axial images were obtained from the base of the skull through the vertex without intravenous contrast. COMPARISON:  03/04/2015; 09/29/2013 FINDINGS: Re- demonstrated advanced atrophy with sulcal prominence and centralized volume loss with commensurate ex vacuo dilatation of the ventricular system. Extensive periventricular hypodensities compatible microvascular ischemic disease. Old punctate right-sided lacunar infarct (image 17, series 2). Given extensive background parenchymal abnormalities, there is no CT evidence of superimposed acute large territory infarct. No intraparenchymal or extra-axial mass or hemorrhage. Unchanged size and configuration of the ventricles and basilar cisterns. No midline shift. Intracranial atherosclerosis. Persistent opacification of the bilateral mastoid air cells. Polypoid mucosal thickening of the left sphenoid sinus. The remaining paranasal sinuses are normally aerated. No air-fluid levels. Regional soft tissues appear normal. Post bilateral cataract surgery. No displaced calvarial fracture. IMPRESSION: 1. Similar findings of advanced atrophy and microvascular ischemic disease without acute intracranial process. 2. Bilateral mastoid air cell effusions, unchanged since the 02/2015 examination. Electronically Signed   By: Simonne Come M.D.   On: 06/29/2015 09:45    EKG: Orders placed or performed during the hospital encounter of 06/29/15  . ED EKG  . ED EKG  . EKG 12-Lead  . EKG 12-Lead    IMPRESSION AND PLAN: Patient is a 82 year old white female with history of COPD CHF hypertension on chronic 2 L of oxygen and sent from skilled nursing facility for hallucinations swelling  1. Acute on chronic diastolic CHF: At this point I will give her low-dose IV Lasix, monitor ins and outs follow her renal function 2. Elevated troponin likely due to demand ischemia. We'll psychotropic cardiac enzymes and obtain echo cardiac Cheree Ditto of the heart  3.  Hyperkalemia recurrent we'll stop her losartan monitor potassium currently too drowsy to receive oral Kayexalate if continues to be elevated we'll give Kayexalate tomorrow  4. Hypertension patient's blood pressure did drop while in the ED. So we'll just keep on Lasix for time being we'll hold her losartan  5. COPD patient apparently was wheezing at the facility will place when necessary nebulizers  6. Hallucinations possibly related to age and ongoing medical issues. Monitor mental status  7. CODE STATUS confirmed with the son she is DO NOT RESUSCITATE    All the records are reviewed and case discussed with ED provider. Management plans discussed with the patient, family and they are in agreement.  CODE STATUS: DO NOT RESUSCITATE Code Status History    Date Active Date Inactive Code Status Order ID Comments User Context   06/07/2015  6:00 PM 06/09/2015  6:09 PM DNR [id number]  Altamese Dilling, MD Inpatient   03/04/2015  9:31 AM 03/06/2015  3:44 PM DNR [id number]  Ihor Austin, MD Inpatient   08/13/2014 10:12 AM 08/14/2014  5:12 PM DNR [id number]  Auburn Bilberry, MD Inpatient   08/10/2014  8:18 PM 08/13/2014 10:12 AM Full Code [id number]  Wyatt Haste, MD ED    Questions for Most Recent Historical Code Status (Order [id number])    Question Answer Comment   In the event of cardiac or respiratory ARREST Do not call a "code blue"    In the event of cardiac or respiratory ARREST Do not perform Intubation, CPR, defibrillation or ACLS    In the event  of cardiac or respiratory ARREST Use medication by any route, position, wound care, and other measures to relive pain and suffering. May use oxygen, suction and manual treatment of airway obstruction as needed for comfort.    Comments confirmed with family.        TOTAL TIME TAKING CARE OF THIS PATIENT:55 minutes.    Auburn Bilberry M.D on 06/29/2015 at 12:16 PM  Between 7am to 6pm - Pager - [phone]  After 6pm go to [email or web address] - password  [facility]  [facility]  Office  [phone]  CC: Primary care physician; Leanna Sato, MD

## 2015-06-29 NOTE — ED Notes (Signed)
Pt placed on 2L , 96% at this time.

## 2015-06-29 NOTE — ED Notes (Signed)
Notified Hunter, RN of elevated troponin.

## 2015-06-29 NOTE — ED Notes (Signed)
Patient transported to CT 

## 2015-06-29 NOTE — ED Notes (Signed)
MD at bedside. 

## 2015-06-30 ENCOUNTER — Inpatient Hospital Stay: Admit: 2015-06-30 | Payer: Medicare Other

## 2015-06-30 ENCOUNTER — Inpatient Hospital Stay (HOSPITAL_COMMUNITY)
Admit: 2015-06-30 | Discharge: 2015-06-30 | Disposition: A | Discharge status: Home / Self Care | Payer: Medicare Other | Attending: Internal Medicine | Admitting: Internal Medicine

## 2015-06-30 DIAGNOSIS — R0600 Dyspnea, unspecified: Secondary | ICD-10-CM

## 2015-06-30 LAB — CBC
HEMATOCRIT: 29.9 % — AB (ref 35.0–47.0)
HEMOGLOBIN: 9.8 g/dL — AB (ref 12.0–16.0)
MCH: 30.9 pg (ref 26.0–34.0)
MCHC: 32.8 g/dL (ref 32.0–36.0)
MCV: 94.2 fL (ref 80.0–100.0)
PLATELETS: 255 10*3/uL (ref 150–440)
RBC: 3.18 MIL/uL — ABNORMAL LOW (ref 3.80–5.20)
RDW: 14.6 % — ABNORMAL HIGH (ref 11.5–14.5)
WBC: 4.7 10*3/uL (ref 3.6–11.0)

## 2015-06-30 LAB — TROPONIN I: Troponin I: 0.11 ng/mL — ABNORMAL HIGH (ref <0.031)

## 2015-06-30 LAB — BASIC METABOLIC PANEL
ANION GAP: 11 (ref 5–15)
BUN: 36 mg/dL — ABNORMAL HIGH (ref 6–20)
CALCIUM: 8.5 mg/dL — AB (ref 8.9–10.3)
CO2: 24 mmol/L (ref 22–32)
Chloride: 103 mmol/L (ref 101–111)
Creatinine, Ser: 1.04 mg/dL — ABNORMAL HIGH (ref 0.44–1.00)
GFR calc Af Amer: 52 mL/min — ABNORMAL LOW (ref >60)
GFR, EST NON AFRICAN AMERICAN: 45 mL/min — AB (ref >60)
Glucose, Bld: 62 mg/dL — ABNORMAL LOW (ref 65–99)
Potassium: 4.9 mmol/L (ref 3.5–5.1)
Sodium: 138 mmol/L (ref 135–145)

## 2015-06-30 MED ORDER — CHLORHEXIDINE GLUCONATE CLOTH 2 % EX PADS
6.0000 | MEDICATED_PAD | Freq: Every day | CUTANEOUS | Status: DC
  Administered 2015-07-02: 6 via TOPICAL

## 2015-06-30 MED ORDER — MUPIROCIN 2 % EX OINT
1.0000 "application " | TOPICAL_OINTMENT | Freq: Two times a day (BID) | CUTANEOUS | Status: DC
  Filled 2015-06-30: qty 22

## 2015-06-30 NOTE — Clinical Social Work Note (Signed)
Clinical Social Work Assessment  Patient Details  Name: Karen Bryan MRN: [id number] Date of Birth: 12/26/1919  Date of referral:  06/30/15               Reason for consult:  Discharge Planning                Permission sought to share information with:  Family Supports Permission granted to share information::  Yes, Verbal Permission Granted  Name::        Agency::   ([facility])  Relationship::   Joannie [place]- Son)  Contact Information:     Housing/Transportation Living arrangements for the past 2 months:  [place] ([facility]) Source of Information:  Patient, Adult Children, Facility Patient Interpreter Needed:  None Criminal Activity/Legal Involvement Pertinent to Current Situation/Hospitalization:  No - Comment as needed Significant Relationships:  Adult Children Lives with:  Facility Resident ([facility]) Do you feel safe going back to the place where you live?  Yes Need for family participation in patient care:  Yes (Comment) Joannie Springs- Son)  Care giving concerns:  Patient is a readmit (discharged 06/12/15) and from [facility]   Social Worker assessment / plan:  CSW was consulted because patient is a resident at [facility]. CSW met with patient at bedside. Per Patient she is from [facility] and would like to return at discharge. Granted CSW verbal permission to communicate with [facility] about discharge plans as well as to speak to her son Emori Mumme.   CSW called patient's son Garnet Koyanagi. Per Garnet Koyanagi patient is to return to [facility] via [facility] transportation at discharge.  CSW contacted Thayer Headings- Admissions Coordinator with [facility] and she informed CSW to call [phone] to speak to determine's patient's baseline. Reports that patient can return at discharge. CSW spoke to [place]. Per Juanita patient's face was swollen and that she was experiencing hallucinations that's why she was brought to [facility]. She  stated that patient walks with a Rossini and uses limited assistance with bathing, dressing and eating. She reports that patient  Can return at discharge.   FL2/ PASRR completed. Awaiting discharge date & completed discharge summary to input the discharge medications onto the FL2. CSW will continue to follow and assist.   Employment status:  Retired Forensic scientist:  Medicare, Medicaid In [place] PT Recommendations:  Not assessed at this time Information / Referral to community resources:     Patient/Family's Response to care:  Patient and her family are in agreement to return to [facility] at discharge.   Patient/Family's Understanding of and Emotional Response to Diagnosis, Current Treatment, and Prognosis:  Patient and her son understand's patient's prognosis and were appreciative of CSW's assistance.   Emotional Assessment Appearance:  Appears stated age Attitude/Demeanor/Rapport:   (None) Affect (typically observed):  Accepting, Calm, Pleasant Orientation:  Oriented to Self, Oriented to Place, Oriented to  Time, Oriented to Situation Alcohol / Substance use:  Not Applicable Psych involvement (Current and /or in the community):  No (Comment)  Discharge Needs  Concerns to be addressed:  Discharge Planning Concerns Readmission within the last 30 days:  Yes Current discharge risk:  Chronically ill Barriers to Discharge:  Continued Medical Work up   [employer], LCSW 06/30/2015, 12:32 PM

## 2015-06-30 NOTE — Care Management Important Message (Signed)
Important Message  Patient Details  Name: Karen Bryan MRN: [id number] Date of Birth: 20-Aug-1919   Medicare Important Message Given:  Yes    Eber Hong, RN 06/30/2015, 8:59 AM

## 2015-06-30 NOTE — Evaluation (Signed)
Physical Therapy Evaluation Patient Details Name: Karen Bryan MRN: [id number] DOB: [birth year] Today's Date: 06/30/2015   History of Present Illness  Patient is a 82 y/o female that presents with confusion from rehab, found to have elevated troponins, indicative of NSTEMI.   Clinical Impression  Patient has recently been seen at this facility, and appears to be at her physical baseline in this session. She has significant flexion (contractures?) in bilateral UEs which appears chronic limiting her functional utilization of them. She is very hard of hearing and is unable to consistently follow directions unless repeated several times. She requires no assistance with bed mobility, no loss of balance with transfers or short distance ambulation. She does state she can't walk, however she was able to ambulate in this session -- though she did self limit distance. She would benefit from PT evaluation at her facility after discharge from this acute hospitalization for any needs she may have.     Follow Up Recommendations Home health PT (If patient has PT at facility, she would benefit from follow up. )    Equipment Recommendations       Recommendations for Other Services       Precautions / Restrictions Precautions Precautions: Fall Restrictions Weight Bearing Restrictions: No      Mobility  Bed Mobility Overal bed mobility: Independent             General bed mobility comments: Supine to/from sit  Transfers Overall transfer level: Needs assistance Equipment used: Rolling Packett (2 wheeled) Transfers: Sit to/from Stand Sit to Stand: Supervision         General transfer comment: Patient requires cuing for hand placement with RW, no other deficit noted.   Ambulation/Gait Ambulation/Gait assistance: Min guard Ambulation Distance (Feet): 15 Feet Assistive device: Rolling Bubb (2 wheeled) Gait Pattern/deviations: Narrow base of support;Drifts right/left;Trunk flexed    Gait velocity interpretation: <1.8 ft/sec, indicative of risk for recurrent falls General Gait Details: Minimal DF and therefore foot clearance is minimal.   Stairs            Wheelchair Mobility    Modified Rankin (Stroke Patients Only)       Balance Overall balance assessment: Needs assistance   Sitting balance-Leahy Scale: Normal     Standing balance support: Bilateral upper extremity supported Standing balance-Leahy Scale: Fair Standing balance comment: No overt loss of balance, though patient showed poor use of RW at times and is therefore at increased risk of falling.                              Pertinent Vitals/Pain Pain Assessment: No/denies pain    Home Living Family/patient expects to be discharged to:: Skilled nursing facility               Home Equipment: [place] - 4 wheels      Prior Function Level of Independence: Needs assistance   Gait / Transfers Assistance Needed: Limited distance AMB with RW   ADL's / Homemaking Assistance Needed: Requires help with dressing, secondary to poor fine motor function bilat hands  Comments: Patient is very hard of hearing, unable to provide any new information. She had been using rollator for use of bathroom, and going to the dining. Assistance for cleaning, dressing, cooking,     Hand Dominance        Extremity/Trunk Assessment   Upper Extremity Assessment: Overall WFL for tasks assessed  Lower Extremity Assessment: Overall WFL for tasks assessed      Cervical / Trunk Assessment: Kyphotic  Communication   Communication: HOH  Cognition Arousal/Alertness: Awake/alert Behavior During Therapy: WFL for tasks assessed/performed;Anxious Overall Cognitive Status: Within Functional Limits for tasks assessed                      General Comments      Exercises        Assessment/Plan    PT Assessment Patient needs continued PT services  PT Diagnosis Difficulty  walking;Generalized weakness   PT Problem List Decreased strength;Decreased activity tolerance;Decreased balance;Decreased mobility;Decreased cognition  PT Treatment Interventions DME instruction;Gait training;Functional mobility training;Therapeutic activities;Therapeutic exercise;Balance training;Patient/family education;Stair training   PT Goals (Current goals can be found in the Care Plan section) Acute Rehab PT Goals Patient Stated Goal: to go back to ALF PT Goal Formulation: With patient Time For Goal Achievement: 07/14/15 Potential to Achieve Goals: Good    Frequency Min 2X/week   Barriers to discharge        Co-evaluation               End of Session Equipment Utilized During Treatment: Gait belt Activity Tolerance: Patient limited by fatigue Patient left: in bed;with call bell/phone within reach;with bed alarm set Nurse Communication: Mobility status         Time: 1501-1520 PT Time Calculation (min) (ACUTE ONLY): 19 min   Charges:   PT Evaluation $PT Eval Low Complexity: 1 Procedure PT Treatments $Therapeutic Activity: 8-22 mins (Assisted patient to commode, changed gown.)   PT G Codes:       Kerin Ransom, PT, DPT    06/30/2015, 5:09 PM

## 2015-06-30 NOTE — Progress Notes (Signed)
[facility] [place] at [facility]   PATIENT NAME: Karen Bryan    MR#:  [id number]  DATE OF BIRTH:  [birth year]  SUBJECTIVE:   She is here due to altered mental status and confusion also noted to be hypoxic. She is very hard of hearing. No acute events overnight.   REVIEW OF SYSTEMS:    Review of Systems  Unable to perform ROS: dementia    Nutrition: Dysphagia 1 Tolerating Diet: Yes Tolerating PT: Eval noted.    DRUG ALLERGIES:   Allergies  Allergen Reactions  . Tramadol Nausea Only    VITALS:  Blood pressure 160/49, pulse 68, temperature 97.6 F (36.4 C), temperature source Oral, resp. rate 18, weight 65.817 kg (145 lb 1.6 oz), SpO2 100 %.  PHYSICAL EXAMINATION:   Physical Exam  GENERAL:  82 y.o.-year-old patient lying in the bed very hard of hearing but in no acute distress.  EYES: Pupils equal, round, reactive to light and accommodation. No scleral icterus. Extraocular muscles intact.  HEENT: Head atraumatic, normocephalic. Oropharynx and nasopharynx clear.  NECK:  Supple, no jugular venous distention. No thyroid enlargement, no tenderness.  LUNGS: Normal breath sounds bilaterally, no wheezing, rales, rhonchi. No use of accessory muscles of respiration.  CARDIOVASCULAR: S1, S2 normal. No murmurs, rubs, or gallops.  ABDOMEN: Soft, nontender, nondistended. Bowel sounds present. No organomegaly or mass.  EXTREMITIES: No cyanosis, clubbing or edema b/l.    NEUROLOGIC: Cranial nerves II through XII are intact. No focal Motor or sensory deficits b/l.  Globally weak.  PSYCHIATRIC: The patient is alert and oriented x 1.  SKIN: No obvious rash, lesion, or ulcer.    LABORATORY PANEL:   CBC  Recent Labs Lab 06/30/15 0446  WBC 4.7  HGB 9.8*  HCT 29.9*  PLT 255   ------------------------------------------------------------------------------------------------------------------  Chemistries   Recent Labs Lab 06/29/15 1011  06/30/15 0446  NA 136 138  K 5.5* 4.9  CL 105 103  CO2 25 24  GLUCOSE 105* 62*  BUN 37* 36*  CREATININE 1.27* 1.04*  CALCIUM 8.3* 8.5*  AST 16  --   ALT 10*  --   ALKPHOS 91  --   BILITOT 0.5  --    ------------------------------------------------------------------------------------------------------------------  Cardiac Enzymes  Recent Labs Lab 06/30/15 0446  TROPONINI 0.11*   ------------------------------------------------------------------------------------------------------------------  RADIOLOGY:  Dg Chest 2 View  06/29/2015  CLINICAL DATA:  Pt arrives to ER via [facility] from [facility] assisting living in [place]. Staff reported to [facility] that pt awoke today with "puffy" eyes, bilateral eye drainage. Expiratory wheezing audible. Hx of CHF, COPD, HTN. EXAM: CHEST  2 VIEW COMPARISON:  06/07/2015 FINDINGS: Mildly enlarged cardiac silhouette. There is new bilateral pleural effusions. There is interstitial edema pattern. Chronic elevation of LEFT hemidiaphragm. Acute osseous findings. IMPRESSION: Interstitial edema and bilateral pleural effusions consistent with congestive heart failure. Electronically Signed   By: Genevive Bi M.D.   On: 06/29/2015 09:20   Ct Head Wo Contrast  06/29/2015  CLINICAL DATA:  Hallucinations EXAM: CT HEAD WITHOUT CONTRAST TECHNIQUE: Contiguous axial images were obtained from the base of the skull through the vertex without intravenous contrast. COMPARISON:  03/04/2015; 09/29/2013 FINDINGS: Re- demonstrated advanced atrophy with sulcal prominence and centralized volume loss with commensurate ex vacuo dilatation of the ventricular system. Extensive periventricular hypodensities compatible microvascular ischemic disease. Old punctate right-sided lacunar infarct (image 17, series 2). Given extensive background parenchymal abnormalities, there is no CT evidence of superimposed acute large territory infarct. No intraparenchymal or extra-axial  mass or hemorrhage.  Unchanged size and configuration of the ventricles and basilar cisterns. No midline shift. Intracranial atherosclerosis. Persistent opacification of the bilateral mastoid air cells. Polypoid mucosal thickening of the left sphenoid sinus. The remaining paranasal sinuses are normally aerated. No air-fluid levels. Regional soft tissues appear normal. Post bilateral cataract surgery. No displaced calvarial fracture. IMPRESSION: 1. Similar findings of advanced atrophy and microvascular ischemic disease without acute intracranial process. 2. Bilateral mastoid air cell effusions, unchanged since the 02/2015 examination. Electronically Signed   By: Simonne Come M.D.   On: 06/29/2015 09:45     ASSESSMENT AND PLAN:   82 year old female with past medical history of dementia, COPD, history of CHF, hypertension, chronic respiratory failure, rheumatoid arthritis, peptic ulcer disease, severe hearing loss, osteoarthritis who presented to the hospital due to altered mental status and also noted to be hypoxic.  #1 CHF-acute on chronic diastolic dysfunction. Patient had echocardiogram in November 2016 which showed normal ejection fraction. -Continue diuresis with IV Lasix, follow I's and O's and daily weights.  #2 acute respiratory failure with hypoxia-secondary to CHF. -Continue treatment as mentioned above.  #3 hyperkalemia-due to mild acute renal failure. -Improved with diuretics and will monitor.  #4 hypothyroidism-continue Synthroid.  #5 history of rheumatoid arthritis-continue Plaquenil.  #6 anxiety-continue Paxil.   All the records are reviewed and case discussed with Care Management/Social Workerr. Management plans discussed with the patient, family and they are in agreement.  CODE STATUS: DO NOT RESUSCITATE  DVT Prophylaxis: Lovenox  TOTAL TIME TAKING CARE OF THIS PATIENT: 30 minutes.   POSSIBLE D/C IN 1-2 DAYS, DEPENDING ON CLINICAL CONDITION.   Houston Siren M.D on 06/30/2015 at 3:19  PM  Between 7am to 6pm - Pager - [phone]  After 6pm go to [email or web address] - password [email or web address]  [facility]  Office  [phone]  CC: Primary care physician; Leanna Sato, MD

## 2015-06-30 NOTE — NC FL2 (Addendum)
[place] MEDICAID FL2 LEVEL OF CARE SCREENING TOOL     IDENTIFICATION  Patient Name: Karen Bryan Birthdate: Dec 26, 1919 Sex: female Admission Date (Current Location): 06/29/2015  [facility] and [place] Number:  [address]  ([id number]) Facility and Address:  [facility], [address], [place], [place] [address]      Provider Number: [id number]  Attending Physician Name and Address:  Houston Siren, MD  Relative Name and Phone Number:       Current Level of Care: Hospital Recommended Level of Care: Assisted Living Facility Prior Approval Number:    Date Approved/Denied:   PASRR Number:  [id number])  Discharge Plan: Domiciliary (Rest home)    Current Diagnoses: Patient Active Problem List   Diagnosis Date Noted  . Acute CHF (congestive heart failure) (HCC) 06/29/2015  . Hyperkalemia 06/07/2015  . Uncontrolled hypertension 06/07/2015  . Acute on chronic renal failure (HCC) 06/07/2015  . Fall 03/04/2015  . Fluid excess 03/04/2015  . CHF (congestive heart failure) (HCC) 03/04/2015  . Cough 10/03/2014  . Dyspnea 10/03/2014  . ILD (interstitial lung disease) (HCC) 10/03/2014  . COPD exacerbation (HCC) 09/26/2014  . Hard of hearing 09/26/2014  . Hypertensive urgency 08/10/2014  . Acute on chronic diastolic heart failure (HCC) 08/10/2014  . Hypertension, essential 08/10/2014  . Rheumatoid arthritis (HCC) 08/10/2014  . COPD (chronic obstructive pulmonary disease) (HCC) 08/10/2014    Orientation RESPIRATION BLADDER Height & Weight     Self, Time, Situation, Place  O2 (Nasal Cannula 2 (L/min) ) Continent Weight: 145 lb 1.6 oz (65.817 kg) Height:     BEHAVIORAL SYMPTOMS/MOOD NEUROLOGICAL BOWEL NUTRITION STATUS   (None)  (None) Continent Diet (DYS 1)  AMBULATORY STATUS COMMUNICATION OF NEEDS Skin   Limited Assist Verbally Normal                       Personal Care Assistance Level of Assistance  Bathing, Feeding, Dressing Bathing  Assistance: Limited assistance Feeding assistance: Independent Dressing Assistance: Limited assistance     Functional Limitations Info  Sight, Speech, Hearing Sight Info: Adequate Hearing Info: Impaired Speech Info: Adequate    SPECIAL CARE FACTORS FREQUENCY                       Contractures      Additional Factors Info  Code Status, Allergies Code Status Info:  (DNR) Allergies Info:  (Tramadol)             DISCHARGE MEDICATIONS:   Current Discharge Medication List    CONTINUE these medications which have CHANGED   Details  furosemide (LASIX) 20 MG tablet Take 1 tablet (20 mg total) by mouth daily. Qty: 30 tablet, Refills: 1      CONTINUE these medications which have NOT CHANGED   Details  acetaminophen (TYLENOL) 500 MG tablet Take 500 mg by mouth 3 (three) times daily. *Not to exceed 3gm in 12 hours*    amLODipine (NORVASC) 5 MG tablet Take 1 tablet (5 mg total) by mouth daily. Qty: 30 tablet, Refills: 2    Emollient (CETAPHIL MOISTURIZING EX) Apply 1 application topically 2 (two) times daily. Apply cream to bilateral legs until resolved.    ENSURE (ENSURE) Take 240 mLs by mouth daily.    hydroxychloroquine (PLAQUENIL) 200 MG tablet Take 200 mg by mouth daily.    hydroxypropyl methylcellulose / hypromellose (ISOPTO TEARS / GONIOVISC) 2.5 % ophthalmic solution Place 1 drop into both eyes daily as needed for  dry eyes.     levothyroxine (SYNTHROID, LEVOTHROID) 50 MCG tablet Take 50 mcg by mouth daily before breakfast.    losartan (COZAAR) 50 MG tablet Take 50 mg by mouth daily.    magnesium sulfate (EPSOM SALT) GRAN Take 1 g by mouth daily. Use 1 cup to soak foot.    Menthol, Topical Analgesic, (BENGAY VANISHING SCENT EX) Apply 1 application topically 4 (four) times daily.     OXYGEN Inhale 2 L/min into the lungs continuous.    PARoxetine (PAXIL) 10 MG tablet Take 5 mg by mouth daily. X 30 days. Then  stop.      STOP taking these medications     cephALEXin (KEFLEX) 500 MG capsule               Relevant Imaging Results:  Relevant Lab Results:   Additional Information  (SSN [id number])  Verta Ellen Sunkins, LCSW

## 2015-06-30 NOTE — Progress Notes (Signed)
Patient rested quietly tonight with no complaints of pain. A&Ox4, VSS, and NSR w/ 1st degree AVB on tele. Nursing staff will continue to monitor. Lamonte Richer, RN

## 2015-06-30 NOTE — Progress Notes (Signed)
*  PRELIMINARY RESULTS* Echocardiogram 2D Echocardiogram has been performed.  Karen Bryan 06/30/2015, 7:34 PM

## 2015-06-30 NOTE — Progress Notes (Signed)
Patient is having runs of PVC'S DR Allena Katz made aware not now order will continue to monitor

## 2015-07-01 LAB — BASIC METABOLIC PANEL
Anion gap: 4 — ABNORMAL LOW (ref 5–15)
BUN: 41 mg/dL — AB (ref 6–20)
CO2: 31 mmol/L (ref 22–32)
CREATININE: 1.06 mg/dL — AB (ref 0.44–1.00)
Calcium: 8.3 mg/dL — ABNORMAL LOW (ref 8.9–10.3)
Chloride: 101 mmol/L (ref 101–111)
GFR calc Af Amer: 51 mL/min — ABNORMAL LOW (ref >60)
GFR calc non Af Amer: 44 mL/min — ABNORMAL LOW (ref >60)
Glucose, Bld: 106 mg/dL — ABNORMAL HIGH (ref 65–99)
POTASSIUM: 5 mmol/L (ref 3.5–5.1)
Sodium: 136 mmol/L (ref 135–145)

## 2015-07-01 LAB — CBC
HEMATOCRIT: 28.1 % — AB (ref 35.0–47.0)
HEMOGLOBIN: 9.4 g/dL — AB (ref 12.0–16.0)
MCH: 31.1 pg (ref 26.0–34.0)
MCHC: 33.3 g/dL (ref 32.0–36.0)
MCV: 93.3 fL (ref 80.0–100.0)
Platelets: 255 10*3/uL (ref 150–440)
RBC: 3.01 MIL/uL — AB (ref 3.80–5.20)
RDW: 14.7 % — ABNORMAL HIGH (ref 11.5–14.5)
WBC: 7 10*3/uL (ref 3.6–11.0)

## 2015-07-01 LAB — ECHOCARDIOGRAM COMPLETE: Weight: 2321.6 oz

## 2015-07-01 NOTE — Evaluation (Signed)
Clinical/Bedside Swallow Evaluation Patient Details  Name: Karen Bryan MRN: [id number] Date of Birth: 1920/02/11  Today's Date: 07/01/2015 Time: SLP Start Time (ACUTE ONLY): 1200 SLP Stop Time (ACUTE ONLY): 1300 SLP Time Calculation (min) (ACUTE ONLY): 60 min  Past Medical History:  Past Medical History  Diagnosis Date  . COPD (chronic obstructive pulmonary disease) (HCC)   . CHF (congestive heart failure) (HCC)   . Hypertension   . Bilateral pneumonia   . Chronic respiratory failure (HCC)   . Bilateral pleural effusion    Past Surgical History:  Past Surgical History  Procedure Laterality Date  . Hip surgery     HPI:  Pt is a 82 y.o. female with a known history of COPD, congestive heart failure, hypertension, chronic respiratory failure requiring 2 L of oxygen who was recently in the hospital for dehydration and hyperkalemia who was discharged to [facility]. Apparently in the past week patient has been confused. Was seen by her primary care provider and there was some adjustments of her medications. Patient also eats since yesterday was confused. And was noted to have swelling. Therefore she was sent to the ER. Currently her son is at the bedside who provides the history. Patient is very hard of hearing and currently she is a little sleepy. She was noted to have chest x-ray was suggestive congestive heart failure. She is again noticed to have hyperkalemia. Pt was initially placed on a Pureed diet but Son reported to MD that pt is able to eat more regular foods. Pt is awake/alert sitting EOB feeding self. Noted limited ROM in fingers requring her to use both hands to grip/steady the milk carton to drink; pt was functional w/ self-feeding w/ setup and assist. Pt denied any trouble swallowing but did endorse loose dentures. Pt is HOH.    Assessment / Plan / Recommendation Clinical Impression  Pt appeared to adequately tolerate trials of thin liquids via straw and purees/mech soft foods  w/ no overt s/s of aspiration noted; no significant oral phase deficits noted except w/ increased textures of foods - w/ such, pt exhibted lengthier mastication time/effort and utilized small sips of liquids to aid oral cleraing/swallowing. Pt fed self w/ setup. Pt seemed to more easily enjoy eating the softer and puree foods; much less was eaten/attempted w/ the increased textured, solid foods. Rec. a mech soft diet w/ addition of puree foods at meals; aspiration precautions; meds in Puree for easier swallowing as nec. and rest breaks as nec during meals to avoid any WOB/SOB. Tray setup at meals and any assistance nec. d/t limited ROM of hands(bilat.)    Aspiration Risk   (reduced)    Diet Recommendation  Mech soft diet; moistened foods; thin liquids; general aspiration precautions  Medication Administration: Whole meds with puree    Other  Recommendations Recommended Consults: Other (Comment) (Dietician) Oral Care Recommendations: Oral care BID;Staff/trained caregiver to provide oral care   Follow up Recommendations  None    Frequency and Duration            Prognosis Prognosis for Safe Diet Advancement: Good Barriers to Reach Goals:  (HOH)      Swallow Study   General Date of Onset: 06/29/15 HPI: Pt is a 82 y.o. female with a known history of COPD, congestive heart failure, hypertension, chronic respiratory failure requiring 2 L of oxygen who was recently in the hospital for dehydration and hyperkalemia who was discharged to [facility]. Apparently in the past week patient has been  confused. Was seen by her primary care provider and there was some adjustments of her medications. Patient also eats since yesterday was confused. And was noted to have swelling. Therefore she was sent to the ER. Currently her son is at the bedside who provides the history. Patient is very hard of hearing and currently she is a little sleepy. She was noted to have chest x-ray was suggestive congestive heart  failure. She is again noticed to have hyperkalemia. Pt was initially placed on a Pureed diet but Son reported to MD that pt is able to eat more regular foods. Pt is awake/alert sitting EOB feeding self. Noted limited ROM in fingers requring her to use both hands to grip/steady the milk carton to drink; pt was functional w/ self-feeding w/ setup and assist. Pt denied any trouble swallowing but did endorse loose dentures. Pt is HOH.  Type of Study: Bedside Swallow Evaluation Previous Swallow Assessment: none indicated Diet Prior to this Study: Dysphagia 1 (puree);Thin liquids (at admission) Temperature Spikes Noted: No (wbc 7.0) Respiratory Status: Nasal cannula (2 liters) History of Recent Intubation: No Behavior/Cognition: Alert;Cooperative;Pleasant mood;Distractible;Requires cueing Oral Cavity Assessment: Within Functional Limits (brief d/t eating) Oral Care Completed by SLP: No (eating her meal) Oral Cavity - Dentition: Dentures, top;Dentures, bottom (min. loose) Vision: Functional for self-feeding Self-Feeding Abilities: Able to feed self;Needs assist;Needs set up Patient Positioning: Upright in bed (EOB) Baseline Vocal Quality: Normal Volitional Cough: Strong Volitional Swallow: Able to elicit    Oral/Motor/Sensory Function     Ice Chips Ice chips: Not tested   Thin Liquid Thin Liquid: Within functional limits Presentation: Self Fed;Straw (3ozs+)    Nectar Thick Nectar Thick Liquid: Not tested   Honey Thick Honey Thick Liquid: Not tested   Puree Puree: Within functional limits Presentation: Self Fed;Spoon (4 ozs)   Solid   GO   Solid: Impaired Presentation: Self Fed;Spoon (2 trials accepted) Oral Phase Impairments: Impaired mastication Oral Phase Functional Implications: Impaired mastication Pharyngeal Phase Impairments:  (none) Other Comments: lengthier oral phase time w/ need to alternate foods/liquids to aid clearing         Jerilynn Som, MS,  CCC-SLP  Watson,Katherine 07/01/2015,3:04 PM

## 2015-07-01 NOTE — Progress Notes (Signed)
[facility] -  at [facility]   PATIENT NAME: Karen Bryan    MR#:  [id number]  DATE OF BIRTH:  [birth year]  SUBJECTIVE:   She is here due to altered mental status and confusion also noted to be hypoxic. She is very hard of hearing. Complaining of some hip pain, son is at bedside.   REVIEW OF SYSTEMS:    Review of Systems  Unable to perform ROS: dementia    Nutrition: Soft diet Tolerating Diet: Yes Tolerating PT: Eval noted.    DRUG ALLERGIES:   Allergies  Allergen Reactions  . Tramadol Nausea Only    VITALS:  Blood pressure 143/60, pulse 67, temperature 97.4 F (36.3 C), temperature source Oral, resp. rate 18, weight 65.817 kg (145 lb 1.6 oz), SpO2 92 %.  PHYSICAL EXAMINATION:   Physical Exam  GENERAL:  82 y.o.-year-old patient lying in the bed very hard of hearing but in no acute distress.  EYES: Pupils equal, round, reactive to light and accommodation. No scleral icterus. Extraocular muscles intact.  HEENT: Head atraumatic, normocephalic. Oropharynx and nasopharynx clear.  NECK:  Supple, no jugular venous distention. No thyroid enlargement, no tenderness.  LUNGS: Normal breath sounds bilaterally, no wheezing, rales, rhonchi. No use of accessory muscles of respiration.  CARDIOVASCULAR: S1, S2 normal. No murmurs, rubs, or gallops.  ABDOMEN: Soft, nontender, nondistended. Bowel sounds present. No organomegaly or mass.  EXTREMITIES: No cyanosis, clubbing, + 1 edema b/l NEUROLOGIC: Cranial nerves II through XII are intact. No focal Motor or sensory deficits b/l.  Globally weak.  PSYCHIATRIC: The patient is alert and oriented x 1.  SKIN: No obvious rash, lesion, or ulcer.    LABORATORY PANEL:   CBC  Recent Labs Lab 07/01/15 0436  WBC 7.0  HGB 9.4*  HCT 28.1*  PLT 255   ------------------------------------------------------------------------------------------------------------------  Chemistries   Recent Labs Lab  06/29/15 1011  07/01/15 0436  NA 136  < > 136  K 5.5*  < > 5.0  CL 105  < > 101  CO2 25  < > 31  GLUCOSE 105*  < > 106*  BUN 37*  < > 41*  CREATININE 1.27*  < > 1.06*  CALCIUM 8.3*  < > 8.3*  AST 16  --   --   ALT 10*  --   --   ALKPHOS 91  --   --   BILITOT 0.5  --   --   < > = values in this interval not displayed. ------------------------------------------------------------------------------------------------------------------  Cardiac Enzymes  Recent Labs Lab 06/30/15 0446  TROPONINI 0.11*   ------------------------------------------------------------------------------------------------------------------  RADIOLOGY:  No results found.   ASSESSMENT AND PLAN:   82 year old female with past medical history of dementia, COPD, history of CHF, hypertension, chronic respiratory failure, rheumatoid arthritis, peptic ulcer disease, severe hearing loss, osteoarthritis who presented to the hospital due to altered mental status and also noted to be hypoxic.  #1 CHF-acute on chronic diastolic dysfunction. Patient had echocardiogram in November 2016 which showed normal ejection fraction. -Continue diuresis with IV Lasix and only about 400 cc (-) since admission.  - cont. Lasix for now but has been weaned off O2.   #2 acute respiratory failure with hypoxia-secondary to CHF. -Continue treatment as mentioned above.  #3 hyperkalemia-due to mild acute renal failure. -Improved with diuretics and will monitor.  #4 hypothyroidism-continue Synthroid.  #5 history of rheumatoid arthritis-continue Plaquenil.  #6 anxiety-continue Paxil.  Likely d/c to Assisted living w/ Home health tomorrow and discussed w/  son.    All the records are reviewed and case discussed with Care Management/Social Workerr. Management plans discussed with the patient, family and they are in agreement.  CODE STATUS: DO NOT RESUSCITATE  DVT Prophylaxis: Lovenox  TOTAL TIME TAKING CARE OF THIS PATIENT: 30  minutes.   POSSIBLE D/C IN 1-2 DAYS, DEPENDING ON CLINICAL CONDITION.   Houston Siren M.D on 07/01/2015 at 3:56 PM  Between 7am to 6pm - Pager - [phone]  After 6pm go to [email or web address] - password [email or web address] [facility]  [facility]  Office  [phone]  CC: Primary care physician; Leanna Sato, MD

## 2015-07-01 NOTE — Plan of Care (Signed)
Problem: Activity: Goal: Capacity to carry out activities will improve Outcome: Progressing Pt has slept most of the shift but is easily aroused.  Problem: Cardiac: Goal: Ability to achieve and maintain adequate cardiopulmonary perfusion will improve Pt is diuresing.

## 2015-07-01 NOTE — Care Management Note (Signed)
Case Management Note  Patient Details  Name: Karen Bryan MRN: [id number] Date of Birth: 27-Mar-1920  Subjective/Objective:   TC to son, Mr. [name]. He is agreeable to home health services for his mom. TC to [place] at [facility] and she states that patient is open to [facility]. TC to [place] with [facility]. Notified her and Liborio Nixon that patient is to return to facility tomorrow with resumption of care. Patient open to nursing, PT and OT.                 Action/Plan: Need resumption of care orders for Nursing, PT and OT.   Expected Discharge Date:    07/02/2015              Expected Discharge Plan:   ALF  In-House Referral:     Discharge planning Services     Post Acute Care Choice:    Choice offered to:     DME Arranged:    DME Agency:     HH Arranged:    HH Agency:     Status of Service:     Medicare Important Message Given:  Yes Date Medicare IM Given:    Medicare IM give by:    Date Additional Medicare IM Given:    Additional Medicare Important Message give by:     If discussed at Long Length of Stay Meetings, dates discussed:    Additional Comments:  Marily Memos, RN 07/01/2015, 11:32 AM

## 2015-07-02 LAB — BASIC METABOLIC PANEL
Anion gap: 6 (ref 5–15)
BUN: 36 mg/dL — AB (ref 6–20)
CALCIUM: 8 mg/dL — AB (ref 8.9–10.3)
CHLORIDE: 96 mmol/L — AB (ref 101–111)
CO2: 31 mmol/L (ref 22–32)
CREATININE: 1.09 mg/dL — AB (ref 0.44–1.00)
GFR calc Af Amer: 49 mL/min — ABNORMAL LOW (ref >60)
GFR calc non Af Amer: 42 mL/min — ABNORMAL LOW (ref >60)
GLUCOSE: 108 mg/dL — AB (ref 65–99)
Potassium: 5.1 mmol/L (ref 3.5–5.1)
Sodium: 133 mmol/L — ABNORMAL LOW (ref 135–145)

## 2015-07-02 MED ORDER — FUROSEMIDE 20 MG PO TABS: 20.0000 mg | ORAL_TABLET | Freq: Every day | ORAL | Status: DC

## 2015-07-02 MED ORDER — FUROSEMIDE 20 MG PO TABS
20.0000 mg | ORAL_TABLET | Freq: Every day | ORAL | Status: DC
  Administered 2015-07-02: 20 mg via ORAL
  Filled 2015-07-02: qty 1

## 2015-07-02 NOTE — Discharge Instructions (Signed)
Heart healthy diet. Activity as tolerated. Fall and aspiration precaution. Continue home O2 [place].

## 2015-07-02 NOTE — Care Management Note (Signed)
Case Management Note  Patient Details  Name: MONTY MCCARRELL MRN: [id number] Date of Birth: 1919/10/12  Subjective/Objective:  Notified [facility] of discharge. Patient on chronic home O2  at facility.                  Action/Plan: [facility] SN/PT/OT  Expected Discharge Date:                  Expected Discharge Plan:  Assisted Living / Rest Home  In-House Referral:     Discharge planning Services  CM Consult  Post Acute Care Choice:  Home Health Choice offered to:  Adult Children  DME Arranged:    DME Agency:     HH Arranged:  RN, PT, OT HH Agency:  [facility]  Status of Service:  Completed, signed off  Medicare Important Message Given:  Yes Date Medicare IM Given:    Medicare IM give by:    Date Additional Medicare IM Given:    Additional Medicare Important Message give by:     If discussed at Long Length of Stay Meetings, dates discussed:    Additional Comments:  Marily Memos, RN 07/02/2015, 9:29 AM

## 2015-07-02 NOTE — Discharge Summary (Addendum)
[facility] [place] at [facility]   PATIENT NAME: Karen Bryan    MR#:  [id number]  DATE OF BIRTH:  Oct 14, 1919  DATE OF ADMISSION:  06/29/2015 ADMITTING PHYSICIAN: Auburn Bilberry, MD  DATE OF DISCHARGE: 07/02/2015 PRIMARY CARE PHYSICIAN: Leanna Sato, MD    ADMISSION DIAGNOSIS:  Hyperkalemia [E87.5] NSTEMI (non-ST elevated myocardial infarction) (HCC) [I21.4] Acute congestive heart failure, unspecified congestive heart failure type (HCC) [I50.9]   DISCHARGE DIAGNOSIS:  acute on chronic diastolic CHF.  acute on chronic respiratory failure with hypoxia  SECONDARY DIAGNOSIS:   Past Medical History  Diagnosis Date  . COPD (chronic obstructive pulmonary disease) (HCC)   . CHF (congestive heart failure) (HCC)   . Hypertension   . Bilateral pneumonia   . Chronic respiratory failure (HCC)   . Bilateral pleural effusion     HOSPITAL COURSE:   82 year old female with past medical history of dementia, COPD, history of CHF, hypertension, chronic respiratory failure, rheumatoid arthritis, peptic ulcer disease, severe hearing loss, osteoarthritis who presented to the hospital due to altered mental status and also noted to be hypoxic.  #1 CHF-acute on chronic diastolic dysfunction. Patient had echocardiogram in November 2016 which showed normal ejection fraction. She has been treated with IV Lasix 20 mg bid.  Change to Lasix 20 mg po daily, continue home O2.   #2 acute on chronic respiratory failure with hypoxia-secondary to CHF. -Continue treatment as mentioned above.  #3 hyperkalemia-due to mild acute renal failure. -Improved with diuretics and f/u BMP as outpatient.  #4 hypothyroidism-continue Synthroid.  #5 history of rheumatoid arthritis-continue Plaquenil.  #6 anxiety-continue Paxil.  DISCHARGE CONDITIONS:   Stable, discharge to ALF with HHPT today.  CONSULTS OBTAINED:     DRUG ALLERGIES:   Allergies  Allergen Reactions  . Tramadol  Nausea Only    DISCHARGE MEDICATIONS:   Current Discharge Medication List    CONTINUE these medications which have CHANGED   Details  furosemide (LASIX) 20 MG tablet Take 1 tablet (20 mg total) by mouth daily. Qty: 30 tablet, Refills: 1      CONTINUE these medications which have NOT CHANGED   Details  acetaminophen (TYLENOL) 500 MG tablet Take 500 mg by mouth 3 (three) times daily. *Not to exceed 3gm in 12 hours*    amLODipine (NORVASC) 5 MG tablet Take 1 tablet (5 mg total) by mouth daily. Qty: 30 tablet, Refills: 2    Emollient (CETAPHIL MOISTURIZING EX) Apply 1 application topically 2 (two) times daily. Apply cream to bilateral legs until resolved.    ENSURE (ENSURE) Take 240 mLs by mouth daily.    hydroxychloroquine (PLAQUENIL) 200 MG tablet Take 200 mg by mouth daily.    hydroxypropyl methylcellulose / hypromellose (ISOPTO TEARS / GONIOVISC) 2.5 % ophthalmic solution Place 1 drop into both eyes daily as needed for dry eyes.     levothyroxine (SYNTHROID, LEVOTHROID) 50 MCG tablet Take 50 mcg by mouth daily before breakfast.    losartan (COZAAR) 50 MG tablet Take 50 mg by mouth daily.    magnesium sulfate (EPSOM SALT) GRAN Take 1 g by mouth daily. Use 1 cup to soak foot.    Menthol, Topical Analgesic, (BENGAY VANISHING SCENT EX) Apply 1 application topically 4 (four) times daily.     OXYGEN Inhale 2 L/min into the lungs continuous.    PARoxetine (PAXIL) 10 MG tablet Take 5 mg by mouth daily. X 30 days. Then stop.      STOP taking these medications  cephALEXin (KEFLEX) 500 MG capsule          DISCHARGE INSTRUCTIONS:    If you experience worsening of your admission symptoms, develop shortness of breath, life threatening emergency, suicidal or homicidal thoughts you must seek medical attention immediately by calling 911 or calling your MD immediately  if symptoms less severe.  You Must read complete instructions/literature along with all the possible adverse  reactions/side effects for all the Medicines you take and that have been prescribed to you. Take any new Medicines after you have completely understood and accept all the possible adverse reactions/side effects.   Please note  You were cared for by a hospitalist during your hospital stay. If you have any questions about your discharge medications or the care you received while you were in the hospital after you are discharged, you can call the unit and asked to speak with the hospitalist on call if the hospitalist that took care of you is not available. Once you are discharged, your primary care physician will handle any further medical issues. Please note that NO REFILLS for any discharge medications will be authorized once you are discharged, as it is imperative that you return to your primary care physician (or establish a relationship with a primary care physician if you do not have one) for your aftercare needs so that they can reassess your need for medications and monitor your lab values.    Today   SUBJECTIVE   No complaint.   VITAL SIGNS:  Blood pressure 142/39, pulse 69, temperature 99.3 F (37.4 C), temperature source Oral, resp. rate 16, weight 62.143 kg (137 lb), SpO2 93 %.  I/O:   Intake/Output Summary (Last 24 hours) at 07/02/15 0916 Last data filed at 07/02/15 0458  Gross per 24 hour  Intake    240 ml  Output   1100 ml  Net   -860 ml    PHYSICAL EXAMINATION:  GENERAL:  82 y.o.-year-old patient lying in the bed with no acute distress.  EYES: Pupils equal, round, reactive to light and accommodation. No scleral icterus. Extraocular muscles intact.  HEENT: Head atraumatic, normocephalic. Oropharynx and nasopharynx clear.  NECK:  Supple, no jugular venous distention. No thyroid enlargement, no tenderness.  LUNGS: Normal breath sounds bilaterally, no wheezing, mild rales, no rhonchi or crepitation. No use of accessory muscles of respiration.  CARDIOVASCULAR: S1, S2  normal. No murmurs, rubs, or gallops.  ABDOMEN: Soft, non-tender, non-distended. Bowel sounds present. No organomegaly or mass.  EXTREMITIES: No pedal edema, cyanosis, or clubbing.  NEUROLOGIC: Cranial nerves II through XII are intact. Muscle strength 4/5 in all extremities. Sensation intact. Gait not checked.  PSYCHIATRIC: The patient is alert and oriented x 3. SKIN: No obvious rash, lesion, or ulcer.   DATA REVIEW:   CBC  Recent Labs Lab 07/01/15 0436  WBC 7.0  HGB 9.4*  HCT 28.1*  PLT 255    Chemistries   Recent Labs Lab 06/29/15 1011  07/02/15 0436  NA 136  < > 133*  K 5.5*  < > 5.1  CL 105  < > 96*  CO2 25  < > 31  GLUCOSE 105*  < > 108*  BUN 37*  < > 36*  CREATININE 1.27*  < > 1.09*  CALCIUM 8.3*  < > 8.0*  AST 16  --   --   ALT 10*  --   --   ALKPHOS 91  --   --   BILITOT 0.5  --   --   < > =  values in this interval not displayed.  Cardiac Enzymes  Recent Labs Lab 06/30/15 0446  TROPONINI 0.11*    Microbiology Results  Results for orders placed or performed during the hospital encounter of 06/07/15  Urine culture     Status: None   Collection Time: 06/07/15 12:49 PM  Result Value Ref Range Status   Specimen Description URINE, RANDOM  Final   Special Requests NONE  Final   Culture MULTIPLE SPECIES PRESENT, SUGGEST RECOLLECTION  Final   Report Status 06/09/2015 FINAL  Final  Rapid Influenza A&B Antigens ([facility] only)     Status: None   Collection Time: 06/07/15  1:02 PM  Result Value Ref Range Status   Influenza A ([facility]) NEGATIVE NEGATIVE Final   Influenza B ([facility]) NEGATIVE NEGATIVE Final  Culture, blood (routine x 2)     Status: None   Collection Time: 06/07/15  2:50 PM  Result Value Ref Range Status   Specimen Description BLOOD RIGHT ANTECUBITAL  Final   Special Requests   Final    BOTTLES DRAWN AEROBIC AND ANAEROBIC  AER 5CC ANA 3CC   Culture NO GROWTH 5 DAYS  Final   Report Status 06/12/2015 FINAL  Final  Culture, blood (routine x 2)      Status: None   Collection Time: 06/07/15  6:19 PM  Result Value Ref Range Status   Specimen Description BLOOD LEFT ANTECUBITAL  Final   Special Requests BOTTLES DRAWN AEROBIC AND ANAEROBIC  1CC  Final   Culture  Setup Time   Final    GRAM POSITIVE COCCI IN CLUSTERS AEROBIC BOTTLE ONLY CRITICAL RESULT CALLED TO, READ BACK BY AND VERIFIED WITH: Roque Cash 06/08/2015 0739 BY J RAZZAKSUAREZ,MT    Culture STAPHYLOCOCCUS AUREUS AEROBIC BOTTLE ONLY   Final   Report Status 06/12/2015 FINAL  Final   Organism ID, Bacteria STAPHYLOCOCCUS AUREUS  Final      Susceptibility   Staphylococcus aureus - MIC*    CIPROFLOXACIN <=0.5 SENSITIVE Sensitive     ERYTHROMYCIN >=8 RESISTANT Resistant     GENTAMICIN <=0.5 SENSITIVE Sensitive     OXACILLIN 0.5 SENSITIVE Sensitive     TETRACYCLINE <=1 SENSITIVE Sensitive     VANCOMYCIN 1 SENSITIVE Sensitive     TRIMETH/SULFA <=10 SENSITIVE Sensitive     CLINDAMYCIN <=0.25 SENSITIVE Sensitive     RIFAMPIN <=0.5 SENSITIVE Sensitive     Inducible Clindamycin NEGATIVE Sensitive     * STAPHYLOCOCCUS AUREUS  Blood Culture ID Panel (Reflexed)     Status: Abnormal   Collection Time: 06/07/15  6:19 PM  Result Value Ref Range Status   Enterococcus species NOT DETECTED NOT DETECTED Final   Vancomycin resistance NOT DETECTED NOT DETECTED Final   Listeria monocytogenes NOT DETECTED NOT DETECTED Final   Staphylococcus species DETECTED (A) NOT DETECTED Corrected    Comment: CRITICAL RESULT CALLED TO, READ BACK BY AND VERIFIED WITH: Roque Cash 06/08/2015 0739 BY J RAZZAKSUAREZ,MT CORRECTED ON 03/05 AT 0746: PREVIOUSLY REPORTED AS DETECTED    Staphylococcus aureus DETECTED (A) NOT DETECTED Final    Comment: CRITICAL RESULT CALLED TO, READ BACK BY AND VERIFIED WITH: Roque Cash 06/08/2015 0739 BY J RAZZAKSUAREZ,MT    Methicillin resistance NOT DETECTED NOT DETECTED Final   Streptococcus species NOT DETECTED NOT DETECTED Final   Streptococcus agalactiae NOT  DETECTED NOT DETECTED Final   Streptococcus pneumoniae NOT DETECTED NOT DETECTED Final   Streptococcus pyogenes NOT DETECTED NOT DETECTED Final   Acinetobacter baumannii NOT DETECTED NOT DETECTED Final  Enterobacteriaceae species NOT DETECTED NOT DETECTED Final   Enterobacter cloacae complex NOT DETECTED NOT DETECTED Final   Escherichia coli NOT DETECTED NOT DETECTED Final   Klebsiella oxytoca NOT DETECTED NOT DETECTED Final   Klebsiella pneumoniae NOT DETECTED NOT DETECTED Final   Proteus species NOT DETECTED NOT DETECTED Final   Serratia marcescens NOT DETECTED NOT DETECTED Final   Carbapenem resistance NOT DETECTED NOT DETECTED Final   Haemophilus influenzae NOT DETECTED NOT DETECTED Final   Neisseria meningitidis NOT DETECTED NOT DETECTED Final   Pseudomonas aeruginosa NOT DETECTED NOT DETECTED Final   Candida albicans NOT DETECTED NOT DETECTED Final   Candida glabrata NOT DETECTED NOT DETECTED Final   Candida krusei NOT DETECTED NOT DETECTED Final   Candida parapsilosis NOT DETECTED NOT DETECTED Final   Candida tropicalis NOT DETECTED NOT DETECTED Final  MRSA PCR Screening     Status: None   Collection Time: 06/07/15  6:25 PM  Result Value Ref Range Status   MRSA by PCR NEGATIVE NEGATIVE Final    Comment:        The GeneXpert MRSA Assay (FDA approved for NASAL specimens only), is one component of a comprehensive MRSA colonization surveillance program. It is not intended to diagnose MRSA infection nor to guide or monitor treatment for MRSA infections.     RADIOLOGY:  No results found.      Management plans discussed with the patient, family and they are in agreement.  CODE STATUS:     Code Status Orders        Start     Ordered   06/29/15 1507  Do not attempt resuscitation (DNR)   Continuous    Question Answer Comment  In the event of cardiac or respiratory ARREST Do not call a "code blue"   In the event of cardiac or respiratory ARREST Do not perform  Intubation, CPR, defibrillation or ACLS   In the event of cardiac or respiratory ARREST Use medication by any route, position, wound care, and other measures to relive pain and suffering. May use oxygen, suction and manual treatment of airway obstruction as needed for comfort.      06/29/15 1506    Code Status History    Date Active Date Inactive Code Status Order ID Comments User Context   06/07/2015  6:00 PM 06/09/2015  6:09 PM DNR [id number]  Altamese Dilling, MD Inpatient   03/04/2015  9:31 AM 03/06/2015  3:44 PM DNR [id number]  Ihor Austin, MD Inpatient   08/13/2014 10:12 AM 08/14/2014  5:12 PM DNR [id number]  Auburn Bilberry, MD Inpatient   08/10/2014  8:18 PM 08/13/2014 10:12 AM Full Code [id number]  Wyatt Haste, MD ED      TOTAL TIME TAKING CARE OF THIS PATIENT: 35 minutes.    Shaune Pollack M.D on 07/02/2015 at 9:16 AM  Between 7am to 6pm - Pager - [phone]  After 6pm go to [email or web address] - password [email or web address] [facility]  [facility]  Office  [phone]  CC: Primary care physician; Leanna Sato, MD

## 2015-07-02 NOTE — Progress Notes (Signed)
Clinical Social Worker was informed that patient will be medically ready to discharge to [facility]. Patient and her son- [name] are in a agreement with plan. CSW called Marylu Lund- [facility] to confirm that patient's bed is ready. All discharge information faxed to [facility] manually [phone] . DNR added to discharge packet.   Patient will discharge to [facility] via [facility]'s transporation at 10:30 AM- 11:00 AM.  Woodroe Mode, MSW, LCSW-A Clinical Social Work Department [phone]

## 2015-07-02 NOTE — Progress Notes (Signed)
A & O but HOH. 2L of oxygen. NSR. Takes meds ok. IV and tele removed. Discharge instructions in packet. Pt has no further concerns at this time. [facility] came to get pt.

## 2015-07-13 ENCOUNTER — Inpatient Hospital Stay
Admission: EM | Admit: 2015-07-13 | Discharge: 2015-07-17 | DRG: 291 | Disposition: A | Discharge status: Hospice | Payer: Medicare Other | Attending: Internal Medicine | Admitting: Internal Medicine

## 2015-07-13 ENCOUNTER — Emergency Department: Payer: Medicare Other

## 2015-07-13 ENCOUNTER — Encounter: Payer: Self-pay | Admitting: Specialist

## 2015-07-13 DIAGNOSIS — I5021 Acute systolic (congestive) heart failure: Secondary | ICD-10-CM | POA: Diagnosis present

## 2015-07-13 DIAGNOSIS — J189 Pneumonia, unspecified organism: Secondary | ICD-10-CM | POA: Diagnosis present

## 2015-07-13 DIAGNOSIS — J441 Chronic obstructive pulmonary disease with (acute) exacerbation: Secondary | ICD-10-CM | POA: Diagnosis present

## 2015-07-13 DIAGNOSIS — E875 Hyperkalemia: Secondary | ICD-10-CM | POA: Diagnosis present

## 2015-07-13 DIAGNOSIS — J810 Acute pulmonary edema: Secondary | ICD-10-CM | POA: Diagnosis not present

## 2015-07-13 DIAGNOSIS — D649 Anemia, unspecified: Secondary | ICD-10-CM | POA: Diagnosis present

## 2015-07-13 DIAGNOSIS — M545 Low back pain: Secondary | ICD-10-CM | POA: Diagnosis present

## 2015-07-13 DIAGNOSIS — I5043 Acute on chronic combined systolic (congestive) and diastolic (congestive) heart failure: Secondary | ICD-10-CM | POA: Diagnosis present

## 2015-07-13 DIAGNOSIS — I130 Hypertensive heart and chronic kidney disease with heart failure and stage 1 through stage 4 chronic kidney disease, or unspecified chronic kidney disease: Secondary | ICD-10-CM | POA: Diagnosis not present

## 2015-07-13 DIAGNOSIS — I272 Other secondary pulmonary hypertension: Secondary | ICD-10-CM | POA: Diagnosis present

## 2015-07-13 DIAGNOSIS — J811 Chronic pulmonary edema: Secondary | ICD-10-CM | POA: Diagnosis not present

## 2015-07-13 DIAGNOSIS — J9801 Acute bronchospasm: Secondary | ICD-10-CM | POA: Diagnosis present

## 2015-07-13 DIAGNOSIS — J470 Bronchiectasis with acute lower respiratory infection: Secondary | ICD-10-CM | POA: Diagnosis present

## 2015-07-13 DIAGNOSIS — N183 Chronic kidney disease, stage 3 (moderate): Secondary | ICD-10-CM | POA: Diagnosis present

## 2015-07-13 DIAGNOSIS — H9190 Unspecified hearing loss, unspecified ear: Secondary | ICD-10-CM | POA: Diagnosis present

## 2015-07-13 DIAGNOSIS — M069 Rheumatoid arthritis, unspecified: Secondary | ICD-10-CM | POA: Diagnosis present

## 2015-07-13 DIAGNOSIS — E039 Hypothyroidism, unspecified: Secondary | ICD-10-CM | POA: Diagnosis present

## 2015-07-13 DIAGNOSIS — J471 Bronchiectasis with (acute) exacerbation: Secondary | ICD-10-CM | POA: Diagnosis not present

## 2015-07-13 DIAGNOSIS — Z515 Encounter for palliative care: Secondary | ICD-10-CM | POA: Diagnosis present

## 2015-07-13 DIAGNOSIS — Z8261 Family history of arthritis: Secondary | ICD-10-CM

## 2015-07-13 DIAGNOSIS — R739 Hyperglycemia, unspecified: Secondary | ICD-10-CM | POA: Diagnosis present

## 2015-07-13 DIAGNOSIS — I872 Venous insufficiency (chronic) (peripheral): Secondary | ICD-10-CM | POA: Diagnosis present

## 2015-07-13 DIAGNOSIS — J9621 Acute and chronic respiratory failure with hypoxia: Secondary | ICD-10-CM | POA: Diagnosis present

## 2015-07-13 DIAGNOSIS — Z6827 Body mass index (BMI) 27.0-27.9, adult: Secondary | ICD-10-CM | POA: Diagnosis not present

## 2015-07-13 DIAGNOSIS — J9620 Acute and chronic respiratory failure, unspecified whether with hypoxia or hypercapnia: Secondary | ICD-10-CM | POA: Diagnosis present

## 2015-07-13 DIAGNOSIS — G8929 Other chronic pain: Secondary | ICD-10-CM | POA: Diagnosis present

## 2015-07-13 DIAGNOSIS — N179 Acute kidney failure, unspecified: Secondary | ICD-10-CM | POA: Diagnosis present

## 2015-07-13 DIAGNOSIS — F419 Anxiety disorder, unspecified: Secondary | ICD-10-CM | POA: Diagnosis present

## 2015-07-13 DIAGNOSIS — E441 Mild protein-calorie malnutrition: Secondary | ICD-10-CM | POA: Diagnosis present

## 2015-07-13 DIAGNOSIS — Z66 Do not resuscitate: Secondary | ICD-10-CM | POA: Diagnosis present

## 2015-07-13 DIAGNOSIS — R54 Age-related physical debility: Secondary | ICD-10-CM | POA: Diagnosis present

## 2015-07-13 DIAGNOSIS — I509 Heart failure, unspecified: Secondary | ICD-10-CM

## 2015-07-13 DIAGNOSIS — I2510 Atherosclerotic heart disease of native coronary artery without angina pectoris: Secondary | ICD-10-CM | POA: Diagnosis present

## 2015-07-13 DIAGNOSIS — Z9981 Dependence on supplemental oxygen: Secondary | ICD-10-CM | POA: Diagnosis not present

## 2015-07-13 HISTORY — DX: Hypothyroidism, unspecified: E03.9

## 2015-07-13 HISTORY — DX: Rheumatoid arthritis, unspecified: M06.9

## 2015-07-13 HISTORY — DX: Anxiety disorder, unspecified: F41.9

## 2015-07-13 LAB — CBC
HEMATOCRIT: 32.7 % — AB (ref 35.0–47.0)
Hemoglobin: 10.6 g/dL — ABNORMAL LOW (ref 12.0–16.0)
MCH: 31 pg (ref 26.0–34.0)
MCHC: 32.4 g/dL (ref 32.0–36.0)
MCV: 95.8 fL (ref 80.0–100.0)
Platelets: 225 10*3/uL (ref 150–440)
RBC: 3.41 MIL/uL — ABNORMAL LOW (ref 3.80–5.20)
RDW: 15.3 % — AB (ref 11.5–14.5)
WBC: 15.7 10*3/uL — AB (ref 3.6–11.0)

## 2015-07-13 LAB — COMPREHENSIVE METABOLIC PANEL
ALT: 16 U/L (ref 14–54)
ANION GAP: 8 (ref 5–15)
AST: 19 U/L (ref 15–41)
Albumin: 3.4 g/dL — ABNORMAL LOW (ref 3.5–5.0)
Alkaline Phosphatase: 144 U/L — ABNORMAL HIGH (ref 38–126)
BILIRUBIN TOTAL: 0.7 mg/dL (ref 0.3–1.2)
BUN: 33 mg/dL — ABNORMAL HIGH (ref 6–20)
CHLORIDE: 99 mmol/L — AB (ref 101–111)
CO2: 28 mmol/L (ref 22–32)
Calcium: 8.4 mg/dL — ABNORMAL LOW (ref 8.9–10.3)
Creatinine, Ser: 1.51 mg/dL — ABNORMAL HIGH (ref 0.44–1.00)
GFR calc Af Amer: 33 mL/min — ABNORMAL LOW (ref >60)
GFR, EST NON AFRICAN AMERICAN: 29 mL/min — AB (ref >60)
Glucose, Bld: 172 mg/dL — ABNORMAL HIGH (ref 65–99)
POTASSIUM: 4.9 mmol/L (ref 3.5–5.1)
Sodium: 135 mmol/L (ref 135–145)
TOTAL PROTEIN: 7.5 g/dL (ref 6.5–8.1)

## 2015-07-13 LAB — BRAIN NATRIURETIC PEPTIDE: B NATRIURETIC PEPTIDE 5: 680 pg/mL — AB (ref 0.0–100.0)

## 2015-07-13 LAB — TROPONIN I: TROPONIN I: 0.06 ng/mL — AB (ref <0.031)

## 2015-07-13 MED ORDER — HALOPERIDOL LACTATE 5 MG/ML IJ SOLN: 2.5000 mg | Freq: Four times a day (QID) | INTRAMUSCULAR | Status: DC | PRN

## 2015-07-13 MED ORDER — METHYLPREDNISOLONE SODIUM SUCC 40 MG IJ SOLR
40.0000 mg | Freq: Two times a day (BID) | INTRAMUSCULAR | Status: DC
Start: 2015-07-13 — End: 2015-07-16
  Administered 2015-07-13 – 2015-07-16 (×6): 40 mg via INTRAVENOUS
  Filled 2015-07-13 (×6): qty 1

## 2015-07-13 MED ORDER — PIPERACILLIN-TAZOBACTAM 3.375 G IVPB 30 MIN
3.3750 g | Freq: Once | INTRAVENOUS | Status: AC
  Administered 2015-07-13: 3.375 g via INTRAVENOUS
  Filled 2015-07-13: qty 50

## 2015-07-13 MED ORDER — FUROSEMIDE 10 MG/ML IJ SOLN
40.0000 mg | Freq: Once | INTRAMUSCULAR | Status: AC
  Administered 2015-07-13: 40 mg via INTRAVENOUS
  Filled 2015-07-13: qty 4

## 2015-07-13 MED ORDER — ACETAMINOPHEN 325 MG PO TABS: 650.0000 mg | ORAL_TABLET | Freq: Four times a day (QID) | ORAL | Status: DC | PRN

## 2015-07-13 MED ORDER — CETYLPYRIDINIUM CHLORIDE 0.05 % MT LIQD
7.0000 mL | Freq: Two times a day (BID) | OROMUCOSAL | Status: DC
  Administered 2015-07-13 – 2015-07-17 (×8): 7 mL via OROMUCOSAL

## 2015-07-13 MED ORDER — IPRATROPIUM-ALBUTEROL 0.5-2.5 (3) MG/3ML IN SOLN
3.0000 mL | Freq: Four times a day (QID) | RESPIRATORY_TRACT | Status: DC
  Administered 2015-07-13 – 2015-07-17 (×15): 3 mL via RESPIRATORY_TRACT
  Filled 2015-07-13 (×15): qty 3

## 2015-07-13 MED ORDER — FUROSEMIDE 10 MG/ML IJ SOLN
20.0000 mg | Freq: Two times a day (BID) | INTRAMUSCULAR | Status: DC
  Administered 2015-07-13 – 2015-07-14 (×2): 20 mg via INTRAVENOUS
  Filled 2015-07-13 (×2): qty 2

## 2015-07-13 MED ORDER — LEVOTHYROXINE SODIUM 50 MCG PO TABS
50.0000 ug | ORAL_TABLET | Freq: Every day | ORAL | Status: DC
  Administered 2015-07-15 – 2015-07-17 (×3): 50 ug via ORAL
  Filled 2015-07-13 (×3): qty 1

## 2015-07-13 MED ORDER — ONDANSETRON HCL 4 MG/2ML IJ SOLN: 4.0000 mg | Freq: Four times a day (QID) | INTRAMUSCULAR | Status: DC | PRN

## 2015-07-13 MED ORDER — LEVOFLOXACIN IN D5W 750 MG/150ML IV SOLN
750.0000 mg | Freq: Once | INTRAVENOUS | Status: AC
  Administered 2015-07-13: 750 mg via INTRAVENOUS
  Filled 2015-07-13: qty 150

## 2015-07-13 MED ORDER — HYDRALAZINE HCL 20 MG/ML IJ SOLN: 10.0000 mg | Freq: Four times a day (QID) | INTRAMUSCULAR | Status: DC | PRN

## 2015-07-13 MED ORDER — VANCOMYCIN HCL IN DEXTROSE 1-5 GM/200ML-% IV SOLN
1000.0000 mg | Freq: Once | INTRAVENOUS | Status: DC
  Filled 2015-07-13: qty 200

## 2015-07-13 MED ORDER — ENOXAPARIN SODIUM 30 MG/0.3ML ~~LOC~~ SOLN
30.0000 mg | SUBCUTANEOUS | Status: DC
  Administered 2015-07-13 – 2015-07-16 (×4): 30 mg via SUBCUTANEOUS
  Filled 2015-07-13 (×4): qty 0.3

## 2015-07-13 MED ORDER — MORPHINE SULFATE (PF) 2 MG/ML IV SOLN
1.0000 mg | INTRAVENOUS | Status: DC | PRN
  Administered 2015-07-13 – 2015-07-14 (×2): 1 mg via INTRAVENOUS
  Filled 2015-07-13 (×2): qty 1

## 2015-07-13 MED ORDER — HYDROXYCHLOROQUINE SULFATE 200 MG PO TABS
200.0000 mg | ORAL_TABLET | Freq: Every day | ORAL | Status: DC
Start: 2015-07-13 — End: 2015-07-17
  Administered 2015-07-14 – 2015-07-16 (×3): 200 mg via ORAL
  Filled 2015-07-13 (×3): qty 1

## 2015-07-13 MED ORDER — LOSARTAN POTASSIUM 50 MG PO TABS
50.0000 mg | ORAL_TABLET | Freq: Every day | ORAL | Status: DC
  Administered 2015-07-14 – 2015-07-16 (×3): 50 mg via ORAL
  Filled 2015-07-13 (×3): qty 1

## 2015-07-13 MED ORDER — ACETAMINOPHEN 650 MG RE SUPP: 650.0000 mg | Freq: Four times a day (QID) | RECTAL | Status: DC | PRN

## 2015-07-13 MED ORDER — GUAIFENESIN 100 MG/5ML PO SYRP
300.0000 mg | ORAL_SOLUTION | Freq: Two times a day (BID) | ORAL | Status: DC
  Administered 2015-07-14: 200 mg via ORAL
  Administered 2015-07-14 – 2015-07-16 (×5): 300 mg via ORAL
  Filled 2015-07-13 (×10): qty 15

## 2015-07-13 MED ORDER — MAGNESIUM SULFATE (LAXATIVE) PO GRAN: 1.0000 g | GRANULES | Freq: Every day | ORAL | Status: DC

## 2015-07-13 MED ORDER — ONDANSETRON HCL 4 MG PO TABS: 4.0000 mg | ORAL_TABLET | Freq: Four times a day (QID) | ORAL | Status: DC | PRN

## 2015-07-13 MED ORDER — LEVOFLOXACIN IN D5W 500 MG/100ML IV SOLN: 500.0000 mg | INTRAVENOUS | Status: DC

## 2015-07-13 MED ORDER — MAGNESIUM OXIDE 400 (241.3 MG) MG PO TABS
400.0000 mg | ORAL_TABLET | Freq: Two times a day (BID) | ORAL | Status: DC
  Administered 2015-07-14 – 2015-07-16 (×6): 400 mg via ORAL
  Filled 2015-07-13 (×7): qty 1

## 2015-07-13 NOTE — ED Notes (Signed)
Lab at bedside to draw blood.

## 2015-07-13 NOTE — Progress Notes (Signed)
Patient received from ED with a low grade fever and hypertensive,wheezing,,4 liters of oxygen with  Oxygen sats 90%,confused and trying to get out of bed,iv antibiotics infusing upon arrival,normal saline infusing.

## 2015-07-13 NOTE — ED Notes (Addendum)
Patient comes from [facility] with c/o SOB. EMS states patient was stating 89% on RA. Patient wears 3L of O2 at home. Upon arrival, SpO2 on RA is 85%. Patient placed on 6L [place]. Now stating 95%. Family states over the last week patient has gotten increasingly weak and has needed more help at home. Patient is very hard of hearing.

## 2015-07-13 NOTE — ED Provider Notes (Signed)
[facility] Emergency Department Provider Note  ____________________________________________    I have reviewed the triage vital signs and the nursing notes.   HISTORY  Chief Complaint Shortness of Breath    HPI Karen Bryan is a 82 y.o. female who presents with complaints of shortness of breath. Patient comes from [facility]. Apparently is on baseline of 3 L of O2. EMS reports patient satting 85% on 3 L. Patient has had worsening weakness over the last week. Positive cough. Positive history of COPD and CHF. Patient denies fevers patient is hard of hearing.     Past Medical History  Diagnosis Date  . COPD (chronic obstructive pulmonary disease) (HCC)   . CHF (congestive heart failure) (HCC)   . Hypertension   . Bilateral pneumonia   . Chronic respiratory failure (HCC)   . Bilateral pleural effusion     Patient Active Problem List   Diagnosis Date Noted  . Acute CHF (congestive heart failure) (HCC) 06/29/2015  . Hyperkalemia 06/07/2015  . Uncontrolled hypertension 06/07/2015  . Acute on chronic renal failure (HCC) 06/07/2015  . Fall 03/04/2015  . Fluid excess 03/04/2015  . CHF (congestive heart failure) (HCC) 03/04/2015  . Cough 10/03/2014  . Dyspnea 10/03/2014  . ILD (interstitial lung disease) (HCC) 10/03/2014  . COPD exacerbation (HCC) 09/26/2014  . Hard of hearing 09/26/2014  . Hypertensive urgency 08/10/2014  . Acute on chronic diastolic heart failure (HCC) 08/10/2014  . Hypertension, essential 08/10/2014  . Rheumatoid arthritis (HCC) 08/10/2014  . COPD (chronic obstructive pulmonary disease) (HCC) 08/10/2014    Past Surgical History  Procedure Laterality Date  . Hip surgery      Current Outpatient Rx  Name  Route  Sig  Dispense  Refill  . acetaminophen (TYLENOL) 500 MG tablet   Oral   Take 500 mg by mouth 3 (three) times daily. *Not to exceed 3gm in 12 hours*         . amLODipine (NORVASC) 5 MG tablet    Oral   Take 1 tablet (5 mg total) by mouth daily.   30 tablet   2   . Emollient (CETAPHIL MOISTURIZING EX)   Apply externally   Apply 1 application topically 2 (two) times daily. Apply cream to bilateral legs until resolved.         Marland Kitchen ENSURE (ENSURE)   Oral   Take 240 mLs by mouth daily.         . furosemide (LASIX) 20 MG tablet   Oral   Take 1 tablet (20 mg total) by mouth daily.   30 tablet   1   . hydroxychloroquine (PLAQUENIL) 200 MG tablet   Oral   Take 200 mg by mouth daily.         . hydroxypropyl methylcellulose / hypromellose (ISOPTO TEARS / GONIOVISC) 2.5 % ophthalmic solution   Both Eyes   Place 1 drop into both eyes daily as needed for dry eyes.          Marland Kitchen levothyroxine (SYNTHROID, LEVOTHROID) 50 MCG tablet   Oral   Take 50 mcg by mouth daily before breakfast.         . losartan (COZAAR) 50 MG tablet   Oral   Take 50 mg by mouth daily.         . magnesium sulfate (EPSOM SALT) GRAN   Oral   Take 1 g by mouth daily. Use 1 cup to soak foot.         Marland Kitchen  Menthol, Topical Analgesic, (BENGAY VANISHING SCENT EX)   Apply externally   Apply 1 application topically 4 (four) times daily.          . OXYGEN   Inhalation   Inhale 2 L/min into the lungs continuous.         Marland Kitchen EXPIRED: PARoxetine (PAXIL) 10 MG tablet   Oral   Take 5 mg by mouth daily. X 30 days. Then stop.           Allergies Tramadol  Family History  Problem Relation Age of Onset  . Pneumonia Mother   . Arthritis Father     Social History Social History  Substance Use Topics  . Smoking status: Never Smoker   . Smokeless tobacco: Never Used  . Alcohol Use: No    Review of Systems  Constitutional: Negative for fever. Eyes: Negative for redness ENT: Negative for sore throat Cardiovascular: Negative for chest pain Respiratory:Positive for cough Gastrointestinal: Negative for abdominal pain Genitourinary: Negative for dysuria. Musculoskeletal: Negative for back  pain. Skin: Negative for rash. Neurological: Negative for focal weakness Psychiatric: no anxiety    ____________________________________________   PHYSICAL EXAM:  VITAL SIGNS: ED Triage Vitals  Enc Vitals Group     BP 07/13/15 1500 185/64 mmHg     Pulse Rate 07/13/15 1425 91     Resp 07/13/15 1425 25     Temp 07/13/15 1425 98.3 F (36.8 C)     Temp Source 07/13/15 1425 Oral     SpO2 07/13/15 1425 97 %     Weight 07/13/15 1425 144 lb 3.2 oz (65.409 kg)     Height 07/13/15 1425 4\' 11"  (1.499 m)     Head Cir --      Peak Flow --      Pain Score 07/13/15 1427 0     Pain Loc --      Pain Edu? --      Excl. in GC? --      Constitutional: Alert and oriented.Hard of hearing Eyes: Conjunctivae are normal. No erythema or injection ENT   Head: Normocephalic and atraumatic.   Mouth/Throat: Mucous membranes are moist. Cardiovascular: Normal rate, regular rhythm. Normal and symmetric distal pulses are present in the upper extremities.  Respiratory: Mild tachypnea. Wheezes heard bilaterally Gastrointestinal: Soft and non-tender in all quadrants. No distention. There is no CVA tenderness. Genitourinary: deferred Musculoskeletal: Nontender with normal range of motion in all extremities. No lower extremity tenderness nor edema. Neurologic:  Normal speech and language. No gross focal neurologic deficits are appreciated. Skin:  Skin is warm, dry and intact. No rash noted. Psychiatric: Mood and affect are normal. Patient exhibits appropriate insight and judgment.  ____________________________________________    LABS (pertinent positives/negatives)  Labs Reviewed  CBC  COMPREHENSIVE METABOLIC PANEL  BRAIN NATRIURETIC PEPTIDE  TROPONIN I    ____________________________________________   EKG  ED ECG REPORT I, 09/12/15, the attending physician, personally viewed and interpreted this ECG.  Date: 07/13/2015 EKG Time: 2:28 PM Rate: 86 Rhythm: normal sinus  rhythm QRS Axis: normal Intervals: normal ST/T Wave abnormalities: normal Conduction Disturbances: none Narrative Interpretation: unremarkable   ____________________________________________    RADIOLOGY  Increased interstitial edema and possible new superimposed traits bilaterally  ____________________________________________   PROCEDURES  Procedure(s) performed: none  Critical Care performed: none  ____________________________________________   INITIAL IMPRESSION / ASSESSMENT AND PLAN / ED COURSE  Pertinent labs & imaging results that were available during my care of the patient were reviewed by me and  considered in my medical decision making (see chart for details).  Patient presents with shortness of breath. Chest x-ray is concerning for worsening edema and possible superimposed infiltrate. I will add antibiotics and blood cultures as possibility of HCAP  Lasix 40 mg IV given  ____________________________________________   FINAL CLINICAL IMPRESSION(S) / ED DIAGNOSES  Final diagnoses:  Acute systolic congestive heart failure (HCC)          Jene Every, MD 07/13/15 1517

## 2015-07-13 NOTE — Progress Notes (Signed)
Order for enoxaparin 40 mg daily for DVT prophylaxis was changed to 30 mg dose per anticoagulation protocol for CrCl < 30 mL/min.

## 2015-07-13 NOTE — ED Notes (Signed)
Lab called and notified of need of assistance to draw blood cultures. States they will send someone down to draw blood.

## 2015-07-13 NOTE — H&P (Addendum)
[facility] - [place] at [facility]   PATIENT NAME: Karen Bryan    MR#:  [id number]  DATE OF BIRTH:  October 14, 1919  DATE OF ADMISSION:  07/13/2015  PRIMARY CARE PHYSICIAN: Leanna Sato, MD   REQUESTING/REFERRING PHYSICIAN: Dr. Alfonse Flavors  CHIEF COMPLAINT:   Chief Complaint  Patient presents with  . Shortness of Breath    HISTORY OF PRESENT ILLNESS:  Karen Bryan  is a 82 y.o. female with a known history of CHF, COPD/bronchiectasis, hypertension, history of pneumonia, rheumatoid arthritis, anxiety, hypothyroidism who presents to the hospital due to his shortness of breath, generalized weakness. Patient was recently discharged from the hospital about a week to 10 days ago after treatment for CHF and now returns back with the above complaints. As per the son noticed most of the history as patient is very hard of hearing patient was doing well until this morning the staff at [facility] noticed that she was having some labored breathing and her blood pressure was somewhat elevated. She was sent to the ER for further evaluation. She is chronically on oxygen at 3 L per minute O2 requirements have gone up in the emergency room was high 6 L. Her chest x-ray findings are suggestive of possible CHF/pneumonia. Hospitalist services were contacted further treatment and evaluation.  PAST MEDICAL HISTORY:   Past Medical History  Diagnosis Date  . COPD (chronic obstructive pulmonary disease) (HCC)   . CHF (congestive heart failure) (HCC)   . Hypertension   . Bilateral pneumonia   . Chronic respiratory failure (HCC)   . Bilateral pleural effusion   . Rheumatoid arthritis (HCC)   . Anxiety   . Hypothyroidism     PAST SURGICAL HISTORY:   Past Surgical History  Procedure Laterality Date  . Hip surgery      SOCIAL HISTORY:   Social History  Substance Use Topics  . Smoking status: Never Smoker   . Smokeless tobacco: Never Used  . Alcohol Use: No     FAMILY HISTORY:   Family History  Problem Relation Age of Onset  . Pneumonia Mother   . Arthritis Father     DRUG ALLERGIES:   Allergies  Allergen Reactions  . Tramadol Nausea Only    REVIEW OF SYSTEMS:   Review of Systems  Unable to perform ROS: mental acuity    MEDICATIONS AT HOME:   Prior to Admission medications   Medication Sig Start Date End Date Taking? Authorizing Provider  acetaminophen (TYLENOL) 500 MG tablet Take 500 mg by mouth 3 (three) times daily. *Not to exceed 3gm in 12 hours*    Historical Provider, MD  amLODipine (NORVASC) 5 MG tablet Take 1 tablet (5 mg total) by mouth daily. 06/10/15   Enedina Finner, MD  Emollient (CETAPHIL MOISTURIZING EX) Apply 1 application topically 2 (two) times daily. Apply cream to bilateral legs until resolved.    Historical Provider, MD  ENSURE (ENSURE) Take 240 mLs by mouth daily.    Historical Provider, MD  furosemide (LASIX) 20 MG tablet Take 1 tablet (20 mg total) by mouth daily. 07/02/15   Shaune Pollack, MD  hydroxychloroquine (PLAQUENIL) 200 MG tablet Take 200 mg by mouth daily.    Historical Provider, MD  hydroxypropyl methylcellulose / hypromellose (ISOPTO TEARS / GONIOVISC) 2.5 % ophthalmic solution Place 1 drop into both eyes daily as needed for dry eyes.     Historical Provider, MD  levothyroxine (SYNTHROID, LEVOTHROID) 50 MCG tablet Take 50 mcg by mouth  daily before breakfast.    Historical Provider, MD  losartan (COZAAR) 50 MG tablet Take 50 mg by mouth daily.    Historical Provider, MD  magnesium sulfate (EPSOM SALT) GRAN Take 1 g by mouth daily. Use 1 cup to soak foot.    Historical Provider, MD  Menthol, Topical Analgesic, (BENGAY VANISHING SCENT EX) Apply 1 application topically 4 (four) times daily.     Historical Provider, MD  OXYGEN Inhale 2 L/min into the lungs continuous.    Historical Provider, MD  PARoxetine (PAXIL) 10 MG tablet Take 5 mg by mouth daily. X 30 days. Then stop. 05/26/15 06/24/15  Historical Provider,  MD      VITAL SIGNS:  Blood pressure 132/73, pulse 97, temperature 98.3 F (36.8 C), temperature source Oral, resp. rate 29, height 4\' 11"  (1.499 m), weight 65.409 kg (144 lb 3.2 oz), SpO2 96 %.  PHYSICAL EXAMINATION:  Physical Exam  GENERAL:  82 y.o.-year-old patient lying in the bed restless and in mild resp. Distress.  EYES: Pupils equal, round, reactive to light and accommodation. No scleral icterus. Extraocular muscles intact.  HEENT: Head atraumatic, normocephalic. Oropharynx and nasopharynx clear. No oropharyngeal erythema, moist oral mucosa  NECK:  Supple, no jugular venous distention. No thyroid enlargement, no tenderness.  LUNGS: Prolonged inspiratory and expiratory phase. Bibasilar rhonchi, wheezing, rales. No use of accessory muscles of respiration.  CARDIOVASCULAR: S1, S2 RRR, tachycardic. No murmurs, rubs, gallops, clicks.  ABDOMEN: Soft, nontender, nondistended. Bowel sounds present. No organomegaly or mass.  EXTREMITIES: +1-2. Dependent edema bilaterally, no cyanosis, or clubbing. + 2 pedal & radial pulses b/l. Significant deformities in the upper extremities secondary to rheumatoid arthritis.  NEUROLOGIC: Cranial nerves II through XII are intact. No focal Motor or sensory deficits appreciated b/l.  Globally weak very hard of hearing. PSYCHIATRIC: The patient is alert and oriented x 1.  SKIN: No obvious rash, lesion, or ulcer.   LABORATORY PANEL:   CBC  Recent Labs Lab 07/13/15 1439  WBC 15.7*  HGB 10.6*  HCT 32.7*  PLT 225   ------------------------------------------------------------------------------------------------------------------  Chemistries   Recent Labs Lab 07/13/15 1439  NA 135  K 4.9  CL 99*  CO2 28  GLUCOSE 172*  BUN 33*  CREATININE 1.51*  CALCIUM 8.4*  AST 19  ALT 16  ALKPHOS 144*  BILITOT 0.7   ------------------------------------------------------------------------------------------------------------------  Cardiac  Enzymes  Recent Labs Lab 07/13/15 1439  TROPONINI 0.06*   ------------------------------------------------------------------------------------------------------------------  RADIOLOGY:  Dg Chest Portable 1 View  07/13/2015  CLINICAL DATA:  Shortness of Breath and hypoxia EXAM: PORTABLE CHEST 1 VIEW COMPARISON:  06/29/2015 FINDINGS: Cardiac shadow is stable. Persistent interstitial changes consistent with CHF are noted in slightly increased when compare with the prior exam. There may be some acute superimposed infiltrate in the bases bilaterally as well. IMPRESSION: Increase in the degree of interstitial edema. There is also some suggestion of acute superimposed infiltrate in the bases bilaterally. Electronically Signed   By: 07/01/2015 M.D.   On: 07/13/2015 14:57     IMPRESSION AND PLAN:   82 year old female with past medical history of rheumatoid arthritis, CHF, COPD, history of bronchiectasis, history of pneumonia, hard of hearing, anxiety, hypothyroidism who presents to the hospital with shortness of breath.  1. Acute on chronic respiratory failure with hypoxia-due to combination of mild CHF/pneumonia. -I will diuresis with IV Lasix, follow I's and O's and daily weights. -Empirically give her IV Levaquin for pneumonia. Follow clinically and wean O2 as tolerated.  2. CHF-diuresis  with IV Lasix, follow I's and O's and daily weights. -Continue losartan.  3. History of bronchiectasis-patient is here with possible pneumonia presently. She does have elevated white cell count and O2 requirements have gone up. -I will treat with IV Levaquin, DuoNeb's scheduled, IV Solu-Medrol. -Follow clinically.  4. History of rheumatoid arthritis-continue Plaquenil.  5. Hypothyroidism-continue Synthroid.   Patient has had frequent hospitalizations for similar reasons for the past few months. I will get a palliative care consult to discuss goals of care. Patient is a DO NOT RESUSCITATE but would  benefit from hospice services.  All the records are reviewed and case discussed with ED provider. Management plans discussed with the patient, family and they are in agreement.  CODE STATUS: DO NOT RESUSCITATE  TOTAL TIME TAKING CARE OF THIS PATIENT: 50 minutes.    Houston Siren M.D on 07/13/2015 at 5:55 PM  Between 7am to 6pm - Pager - [phone]  After 6pm go to [email or web address] - password [email or web address] [facility]  [place] [facility]  Office  [phone]  CC: Primary care physician; Leanna Sato, MD

## 2015-07-13 NOTE — ED Notes (Signed)
Lab at bedside to draw cultures

## 2015-07-13 NOTE — ED Notes (Signed)
Lab staff unable to draw blood. States they will send another person to draw.

## 2015-07-13 NOTE — ED Notes (Signed)
Lab attempted to draw blood x3. Unsuccessful. MD aware.

## 2015-07-13 NOTE — ED Notes (Signed)
Attempted to call report x 1  

## 2015-07-13 NOTE — Progress Notes (Signed)
Pharmacy Antibiotic Note  Karen Bryan is a 82 y.o. female admitted on 07/13/2015 with pneumonia.  Pharmacy has been consulted for levofloxacin dosing.  Plan: Levofloxacin 750 mg IV x 1 dose followed by 500 mg IV q48 hours  Height: 4\' 11"  (149.9 cm) Weight: 144 lb 3.2 oz (65.409 kg) IBW/kg (Calculated) : 43.2  Temp (24hrs), Avg:98.7 F (37.1 C), Min:98.3 F (36.8 C), Max:99 F (37.2 C)   Recent Labs Lab 07/13/15 1439  WBC 15.7*  CREATININE 1.51*    Estimated Creatinine Clearance: 19.1 mL/min (by C-G formula based on Cr of 1.51).    Allergies  Allergen Reactions  . Tramadol Nausea Only   Thank you for allowing pharmacy to be a part of this patient's care.  09/12/15, PharmD 07/13/2015 7:30 PM

## 2015-07-13 NOTE — ED Provider Notes (Signed)
Patient oxygen saturation remains adequate at 93% on 6 L nasal cannula. Not in respiratory distress at the moment. Does remain tachypneic with increased work of breathing. Blood pressure is consistent with volume overload. Heart rate is normal. Afebrile.  Labs significant for acute renal insufficiency and leukocytosis concerning for healthcare associated pneumonia. Intravenous antibiotics already ordered by Dr. Cyril Loosen.  Nursing and lab were unable to obtain blood samples for cultures, so I placed a peripheral IV at the bedside using real-time continuous ultrasound visualization. One attempt, left brachial vein. Good return of blood. Flushes easily. No complications, EBL 0. Blood culture obtained.  Case discussed with hospitalist for admission.  Sharman Cheek, MD 07/13/15 [phone]

## 2015-07-13 NOTE — ED Notes (Addendum)
Patient turned down to 3L [place]. Tolerating well. SpO2 95%.

## 2015-07-14 LAB — CBC
HEMATOCRIT: 28.2 % — AB (ref 35.0–47.0)
HEMOGLOBIN: 9.1 g/dL — AB (ref 12.0–16.0)
MCH: 31.1 pg (ref 26.0–34.0)
MCHC: 32.3 g/dL (ref 32.0–36.0)
MCV: 96.3 fL (ref 80.0–100.0)
Platelets: 211 10*3/uL (ref 150–440)
RBC: 2.93 MIL/uL — AB (ref 3.80–5.20)
RDW: 14.9 % — ABNORMAL HIGH (ref 11.5–14.5)
WBC: 13.5 10*3/uL — ABNORMAL HIGH (ref 3.6–11.0)

## 2015-07-14 LAB — BASIC METABOLIC PANEL
Anion gap: 4 — ABNORMAL LOW (ref 5–15)
BUN: 37 mg/dL — ABNORMAL HIGH (ref 6–20)
CO2: 30 mmol/L (ref 22–32)
Calcium: 7.8 mg/dL — ABNORMAL LOW (ref 8.9–10.3)
Chloride: 101 mmol/L (ref 101–111)
Creatinine, Ser: 1.47 mg/dL — ABNORMAL HIGH (ref 0.44–1.00)
GFR calc Af Amer: 34 mL/min — ABNORMAL LOW (ref >60)
GFR calc non Af Amer: 30 mL/min — ABNORMAL LOW (ref >60)
GLUCOSE: 171 mg/dL — AB (ref 65–99)
Potassium: 4.7 mmol/L (ref 3.5–5.1)
Sodium: 135 mmol/L (ref 135–145)

## 2015-07-14 LAB — INFLUENZA PANEL BY PCR (TYPE A & B)
H1N1 flu by pcr: NOT DETECTED
INFLBPCR: NEGATIVE
Influenza A By PCR: NEGATIVE

## 2015-07-14 MED ORDER — PAROXETINE HCL 20 MG PO TABS
10.0000 mg | ORAL_TABLET | Freq: Every day | ORAL | Status: DC
  Administered 2015-07-14 – 2015-07-16 (×3): 10 mg via ORAL
  Filled 2015-07-14 (×3): qty 1

## 2015-07-14 MED ORDER — LEVOFLOXACIN IN D5W 500 MG/100ML IV SOLN
500.0000 mg | INTRAVENOUS | Status: DC
  Filled 2015-07-14: qty 100

## 2015-07-14 MED ORDER — LEVOFLOXACIN IN D5W 750 MG/150ML IV SOLN
750.0000 mg | INTRAVENOUS | Status: AC
  Administered 2015-07-14: 750 mg via INTRAVENOUS
  Filled 2015-07-14: qty 150

## 2015-07-14 MED ORDER — OXYCODONE-ACETAMINOPHEN 5-325 MG PO TABS
1.0000 | ORAL_TABLET | Freq: Four times a day (QID) | ORAL | Status: DC | PRN
  Administered 2015-07-14 – 2015-07-16 (×4): 1 via ORAL
  Filled 2015-07-14 (×4): qty 1

## 2015-07-14 MED ORDER — FUROSEMIDE 10 MG/ML IJ SOLN
40.0000 mg | Freq: Two times a day (BID) | INTRAMUSCULAR | Status: DC
  Administered 2015-07-14 – 2015-07-15 (×2): 40 mg via INTRAVENOUS
  Filled 2015-07-14 (×2): qty 4

## 2015-07-14 NOTE — Care Management (Addendum)
Admitted to [facility] with the diagnosis of acute on chronic respiratory failure. A resident of [facility]. Dr. Darreld Mclean id listed as primary care physician. Daughter is Sherri [phone]). Followed by [facility] in the facility. Followed by [facility] in the past. [facility] provides chronic oxygen. (08/2014). Uses a rolling Finch to aid in ambulation. Hard of hearing.  Palliative Care consult ordered. IV Levaquin and Zosyn continues. Received telephone call from Alvis Lemmings at [facility]. Unable to take Ms. Ansley into their facility.  States that the family declined Hospice services last week. Gwenette Greet RN MSN CCM Care Management [phone]

## 2015-07-14 NOTE — Progress Notes (Signed)
[facility] - [place] at [facility]   PATIENT NAME: Karen Bryan    MR#:  [id number]  DATE OF BIRTH:  1920/01/27  SUBJECTIVE:  CHIEF COMPLAINT:   Chief Complaint  Patient presents with  . Shortness of Breath   Continues to have shortness of breath. Decreased hearing and confusion making history taking difficult. On 5 L oxygen. Wears 3 L at baseline.  REVIEW OF SYSTEMS:    Review of Systems  Unable to perform ROS: mental status change    DRUG ALLERGIES:   Allergies  Allergen Reactions  . Tramadol Nausea Only    VITALS:  Blood pressure 126/39, pulse 86, temperature 97.8 F (36.6 C), temperature source Oral, resp. rate 18, height 4\' 11"  (1.499 m), weight 62.324 kg (137 lb 6.4 oz), SpO2 96 %.  PHYSICAL EXAMINATION:   Physical Exam  GENERAL:  82 y.o.-year-old patient lying in the bed with respiratory distress. Decreased hearing EYES: Pupils equal, round, reactive to light and accommodation. No scleral icterus. Extraocular muscles intact.  HEENT: Head atraumatic, normocephalic. Oropharynx and nasopharynx clear.  NECK:  Supple, no jugular venous distention. No thyroid enlargement, no tenderness.  LUNGS: One to 2 word conversational dyspnea. Bilateral wheezing and crackles. CARDIOVASCULAR: S1, S2 normal. No murmurs, rubs, or gallops.  ABDOMEN: Soft, nontender, nondistended. Bowel sounds present. No organomegaly or mass.  EXTREMITIES: No cyanosis, clubbing or edema b/l.    NEUROLOGIC: Cranial nerves II through XII are intact. No focal Motor or sensory deficits b/l.   PSYCHIATRIC: The patient is alert and awake SKIN: No obvious rash, lesion, or ulcer.   LABORATORY PANEL:   CBC  Recent Labs Lab 07/14/15 0045  WBC 13.5*  HGB 9.1*  HCT 28.2*  PLT 211   ------------------------------------------------------------------------------------------------------------------ Chemistries   Recent Labs Lab 07/13/15 1439 07/14/15 0045  NA 135 135  K  4.9 4.7  CL 99* 101  CO2 28 30  GLUCOSE 172* 171*  BUN 33* 37*  CREATININE 1.51* 1.47*  CALCIUM 8.4* 7.8*  AST 19  --   ALT 16  --   ALKPHOS 144*  --   BILITOT 0.7  --    ------------------------------------------------------------------------------------------------------------------  Cardiac Enzymes  Recent Labs Lab 07/13/15 1439  TROPONINI 0.06*   ------------------------------------------------------------------------------------------------------------------  RADIOLOGY:  Dg Chest Portable 1 View  07/13/2015  CLINICAL DATA:  Shortness of Breath and hypoxia EXAM: PORTABLE CHEST 1 VIEW COMPARISON:  06/29/2015 FINDINGS: Cardiac shadow is stable. Persistent interstitial changes consistent with CHF are noted in slightly increased when compare with the prior exam. There may be some acute superimposed infiltrate in the bases bilaterally as well. IMPRESSION: Increase in the degree of interstitial edema. There is also some suggestion of acute superimposed infiltrate in the bases bilaterally. Electronically Signed   By: 07/01/2015 M.D.   On: 07/13/2015 14:57     ASSESSMENT AND PLAN:   82 year old female with past medical history of rheumatoid arthritis, CHF, COPD, history of bronchiectasis, history of pneumonia, hard of hearing, anxiety, hypothyroidism who presented to the hospital with shortness of breath.  # Acute on chronic respiratory failure with hypoxia-due to combination of  CHF/pneumonia. Wean oxygen as tolerated. Nebulizers when necessary.  # Acute on chronic diastolic CHF - IV Lasix dose increased today to 60 mg IV twice a day - Input and Output - Monitor Bun/Cr and Potassium - Echo from March 2017 reviewed. Ejection fraction 60%. -Cardiology follow up after discharge  # Bibasilar pneumonia with COPD exacerbation -IV steroids, Antibiotics - Scheduled  Nebulizers - Inhalers -Wean O2 as tolerated - Consult pulmonary if no improvement  # CKD stage III is  stable Monitor while being diureses  # History of rheumatoid arthritis-continue Plaquenil.  # Hypothyroidism-continue Synthroid.  # DVT prophylaxis with Lovenox  All the records are reviewed and case discussed with Care Management/Social Workerr. Management plans discussed with the patient, family and they are in agreement.  CODE STATUS: DNR  DVT Prophylaxis: SCDs  TOTAL TIME TAKING CARE OF THIS PATIENT: 35 minutes.   POSSIBLE D/C IN 2-3 DAYS, DEPENDING ON CLINICAL CONDITION.  Patient has poor prognosis with recurrent admissions. Palliative care consulted.  Milagros Loll R M.D on 07/14/2015 at 10:33 AM  Between 7am to 6pm - Pager - [phone]  After 6pm go to [email or web address] - password [email or web address] [facility]  [place] [facility]  Office  [phone]  CC: Primary care physician; Leanna Sato, MD  Note: This dictation was prepared with Dragon dictation along with smaller phrase technology. Any transcriptional errors that result from this process are unintentional.

## 2015-07-14 NOTE — Progress Notes (Signed)
Influenza PCR negative 

## 2015-07-14 NOTE — Evaluation (Signed)
Physical Therapy Evaluation Patient Details Name: Karen Bryan MRN: [id number] DOB: 04-27-1919 Today's Date: 07/14/2015   History of Present Illness  Patient is a 82 y/o female that presents with acute on chronic CHF and bibasilar pneumonia with COPD exacerbation from ALF.   Clinical Impression  Patient recently seen by this author while admitted to [facility]. She presents today at roughly her recent baseline physically, perhaps slightly more deconditioned than previous admissions. She is able to complete bed mobility and transfers without overt PT assistance, however her ambulation is limited primarily by deconditioning and fatigue. Unclear what level of support she has at her home environment, if she is required to perform longer distance mobility she would benefit from formal STR, however if she is expected to complete mobility in the room she appears capable of doing so with supervision. She does endorse a fall leading to a bruise on her L anterior distal tibia, she reports no pain associated with it.     Follow Up Recommendations Home health PT;Supervision/Assistance - 24 hour (Patient will require higher level of care than after previous admission, would not recommend she ambulate without someone present. )    Equipment Recommendations  Rolling Sambrano with 5" wheels    Recommendations for Other Services       Precautions / Restrictions Precautions Precautions: Fall Restrictions Weight Bearing Restrictions: No      Mobility  Bed Mobility Overal bed mobility: Independent             General bed mobility comments: Mild deficit noted with final stages of pulling herself to sitting position, otherwise no deficits noted.   Transfers Overall transfer level: Needs assistance Equipment used: Rolling [place] (2 wheeled) Transfers: Sit to/from Stand Sit to Stand: Supervision;Min guard         General transfer comment: Patient requires guidance for hand placement, no loss of  balance though minor contact assistance for loss of balance noted.   Ambulation/Gait Ambulation/Gait assistance: Supervision;Min guard Ambulation Distance (Feet): 12 Feet Assistive device: Rolling Shur (2 wheeled) Gait Pattern/deviations: Narrow base of support;Trunk flexed;Decreased step length - right;Decreased step length - left   Gait velocity interpretation: Below normal speed for age/gender General Gait Details: Patient ambulates with significant trunk flexion, narrow BOS. No drifting noted, however she fatigued quite quickly with ambulation.   Stairs            Wheelchair Mobility    Modified Rankin (Stroke Patients Only)       Balance Overall balance assessment: Needs assistance   Sitting balance-Leahy Scale: Normal     Standing balance support: Bilateral upper extremity supported Standing balance-Leahy Scale: Fair Standing balance comment: No buckling or loss of balance noted though impulsive tendencies with backing in to chair noted (RW anterior to her COM).                              Pertinent Vitals/Pain Pain Assessment:  (No mentions or overt display of pain)    Home Living Family/patient expects to be discharged to:: Assisted living               Home Equipment: Christensen - 4 wheels      Prior Function Level of Independence: Needs assistance   Gait / Transfers Assistance Needed: Limited distance AMB with RW   ADL's / Homemaking Assistance Needed: Requires help with dressing, secondary to poor fine motor function bilat hands  Comments: Patient is very hard  of hearing, unable to provide any new information. She had been using rollator for use of bathroom, and going to the dining. Assistance for cleaning, dressing, cooking,     Hand Dominance        Extremity/Trunk Assessment   Upper Extremity Assessment:  (Fairly advanced flexion contractures on bilateral UEs. (or degenerative changes).)           Lower Extremity  Assessment: Overall WFL for tasks assessed      Cervical / Trunk Assessment: Kyphotic  Communication   Communication: HOH  Cognition Arousal/Alertness: Awake/alert Behavior During Therapy: WFL for tasks assessed/performed Overall Cognitive Status:  (Appears to have some cognitive age related deficits, unclear due to poor hearing. )                      General Comments General comments (skin integrity, edema, etc.): Bruisning around L ankle, patient reports she fell and it hurts just a bit now.     Exercises        Assessment/Plan    PT Assessment Patient needs continued PT services  PT Diagnosis Difficulty walking;Generalized weakness   PT Problem List Decreased strength;Decreased activity tolerance;Decreased balance;Decreased mobility;Decreased cognition  PT Treatment Interventions DME instruction;Gait training;Functional mobility training;Therapeutic activities;Therapeutic exercise;Balance training;Patient/family education;Stair training   PT Goals (Current goals can be found in the Care Plan section) Acute Rehab PT Goals Patient Stated Goal: to go back to ALF PT Goal Formulation: With patient Time For Goal Achievement: 07/14/15 Potential to Achieve Goals: Good    Frequency Min 2X/week   Barriers to discharge        Co-evaluation               End of Session Equipment Utilized During Treatment: Gait belt Activity Tolerance: Patient limited by fatigue Patient left: in chair;with chair alarm set;with call bell/phone within reach Nurse Communication: Mobility status         Time: 7654-6503 PT Time Calculation (min) (ACUTE ONLY): 14 min   Charges:   PT Evaluation $PT Eval Moderate Complexity: 1 Procedure     PT G Codes:       Kerin Ransom, PT, DPT    07/14/2015, 6:28 PM

## 2015-07-15 ENCOUNTER — Inpatient Hospital Stay: Payer: Medicare Other

## 2015-07-15 DIAGNOSIS — R2689 Other abnormalities of gait and mobility: Secondary | ICD-10-CM

## 2015-07-15 DIAGNOSIS — I272 Other secondary pulmonary hypertension: Secondary | ICD-10-CM

## 2015-07-15 DIAGNOSIS — D649 Anemia, unspecified: Secondary | ICD-10-CM

## 2015-07-15 DIAGNOSIS — R531 Weakness: Secondary | ICD-10-CM

## 2015-07-15 DIAGNOSIS — H9190 Unspecified hearing loss, unspecified ear: Secondary | ICD-10-CM

## 2015-07-15 DIAGNOSIS — Z515 Encounter for palliative care: Secondary | ICD-10-CM

## 2015-07-15 DIAGNOSIS — G8929 Other chronic pain: Secondary | ICD-10-CM

## 2015-07-15 DIAGNOSIS — J471 Bronchiectasis with (acute) exacerbation: Secondary | ICD-10-CM

## 2015-07-15 DIAGNOSIS — N179 Acute kidney failure, unspecified: Secondary | ICD-10-CM

## 2015-07-15 DIAGNOSIS — E875 Hyperkalemia: Secondary | ICD-10-CM

## 2015-07-15 DIAGNOSIS — J90 Pleural effusion, not elsewhere classified: Secondary | ICD-10-CM

## 2015-07-15 DIAGNOSIS — J449 Chronic obstructive pulmonary disease, unspecified: Secondary | ICD-10-CM

## 2015-07-15 DIAGNOSIS — J9621 Acute and chronic respiratory failure with hypoxia: Secondary | ICD-10-CM

## 2015-07-15 DIAGNOSIS — J189 Pneumonia, unspecified organism: Secondary | ICD-10-CM

## 2015-07-15 DIAGNOSIS — I511 Rupture of chordae tendineae, not elsewhere classified: Secondary | ICD-10-CM

## 2015-07-15 DIAGNOSIS — E1165 Type 2 diabetes mellitus with hyperglycemia: Secondary | ICD-10-CM

## 2015-07-15 DIAGNOSIS — M069 Rheumatoid arthritis, unspecified: Secondary | ICD-10-CM

## 2015-07-15 DIAGNOSIS — N183 Chronic kidney disease, stage 3 (moderate): Secondary | ICD-10-CM

## 2015-07-15 DIAGNOSIS — R609 Edema, unspecified: Secondary | ICD-10-CM

## 2015-07-15 DIAGNOSIS — Z8701 Personal history of pneumonia (recurrent): Secondary | ICD-10-CM

## 2015-07-15 DIAGNOSIS — I252 Old myocardial infarction: Secondary | ICD-10-CM

## 2015-07-15 DIAGNOSIS — E039 Hypothyroidism, unspecified: Secondary | ICD-10-CM

## 2015-07-15 DIAGNOSIS — J8410 Pulmonary fibrosis, unspecified: Secondary | ICD-10-CM

## 2015-07-15 DIAGNOSIS — M545 Low back pain: Secondary | ICD-10-CM

## 2015-07-15 DIAGNOSIS — I509 Heart failure, unspecified: Secondary | ICD-10-CM

## 2015-07-15 DIAGNOSIS — J810 Acute pulmonary edema: Secondary | ICD-10-CM

## 2015-07-15 DIAGNOSIS — I2510 Atherosclerotic heart disease of native coronary artery without angina pectoris: Secondary | ICD-10-CM

## 2015-07-15 DIAGNOSIS — F329 Major depressive disorder, single episode, unspecified: Secondary | ICD-10-CM

## 2015-07-15 DIAGNOSIS — I129 Hypertensive chronic kidney disease with stage 1 through stage 4 chronic kidney disease, or unspecified chronic kidney disease: Secondary | ICD-10-CM

## 2015-07-15 DIAGNOSIS — Z66 Do not resuscitate: Secondary | ICD-10-CM

## 2015-07-15 LAB — BASIC METABOLIC PANEL
ANION GAP: 9 (ref 5–15)
BUN: 56 mg/dL — ABNORMAL HIGH (ref 6–20)
CO2: 27 mmol/L (ref 22–32)
Calcium: 7.8 mg/dL — ABNORMAL LOW (ref 8.9–10.3)
Chloride: 94 mmol/L — ABNORMAL LOW (ref 101–111)
Creatinine, Ser: 1.72 mg/dL — ABNORMAL HIGH (ref 0.44–1.00)
GFR calc Af Amer: 28 mL/min — ABNORMAL LOW (ref >60)
GFR, EST NON AFRICAN AMERICAN: 24 mL/min — AB (ref >60)
GLUCOSE: 185 mg/dL — AB (ref 65–99)
POTASSIUM: 4.9 mmol/L (ref 3.5–5.1)
Sodium: 130 mmol/L — ABNORMAL LOW (ref 135–145)

## 2015-07-15 LAB — CBC WITH DIFFERENTIAL/PLATELET
BASOS ABS: 0 10*3/uL (ref 0–0.1)
Basophils Relative: 0 %
EOS PCT: 0 %
Eosinophils Absolute: 0 10*3/uL (ref 0–0.7)
HCT: 28.3 % — ABNORMAL LOW (ref 35.0–47.0)
HEMOGLOBIN: 9.2 g/dL — AB (ref 12.0–16.0)
Lymphocytes Relative: 1 %
Lymphs Abs: 0.1 10*3/uL — ABNORMAL LOW (ref 1.0–3.6)
MCH: 31.2 pg (ref 26.0–34.0)
MCHC: 32.5 g/dL (ref 32.0–36.0)
MCV: 96.1 fL (ref 80.0–100.0)
Monocytes Absolute: 0.4 10*3/uL (ref 0.2–0.9)
Monocytes Relative: 3 %
NEUTROS ABS: 12.9 10*3/uL — AB (ref 1.4–6.5)
NEUTROS PCT: 96 %
PLATELETS: 261 10*3/uL (ref 150–440)
RBC: 2.95 MIL/uL — AB (ref 3.80–5.20)
RDW: 14.9 % — ABNORMAL HIGH (ref 11.5–14.5)
WBC: 13.5 10*3/uL — AB (ref 3.6–11.0)

## 2015-07-15 MED ORDER — FUROSEMIDE 20 MG PO TABS
20.0000 mg | ORAL_TABLET | Freq: Two times a day (BID) | ORAL | Status: DC
  Administered 2015-07-15 – 2015-07-16 (×2): 20 mg via ORAL
  Filled 2015-07-15 (×2): qty 1

## 2015-07-15 MED ORDER — SENNOSIDES-DOCUSATE SODIUM 8.6-50 MG PO TABS
1.0000 | ORAL_TABLET | Freq: Two times a day (BID) | ORAL | Status: DC
  Administered 2015-07-16 (×2): 1 via ORAL
  Filled 2015-07-15 (×2): qty 1

## 2015-07-15 NOTE — Consult Note (Signed)
Palliative Medicine Inpatient Consult Note   Name: Karen Bryan Date: 07/15/2015 MRN: [id number]  DOB: [birth year]  Referring Physician: Adrian Saran, MD  Palliative Care consult requested for this 82 y.o. female for goals of medical therapy in patient with acute on chronic respiratory failure.   TODAY'S DISCUSSIONS AND DECISIONS: I am aware that pt is extremely hard of hearing and sometimes is confused.  She has no family in the room and she is sleeping currently.  I opted to wait until tomorrow to try to talk with pt with family in the room (if they visit regularly).  I would like to touch base with Dr. Dema Severin as he apparently follows pt on outpt basis.  It appears that pt has so many similar admissions that opting for hospice might not seem something they would want to do, since she 'keeps on going'  ( see list of admissions below under history).  She is getting older as time passes and her resiliency is decreasing with time passing, however.   Her oxygen needs are increasing. She is mildly malnourished based on an albumin of 3.4.  She is hyperglycemic --and one remote note lists diabetes as a diagnosis though other notes do not list this diagnosis.  She has other medical facts noted as detailed below.    I suspect that she is her own medical decision maker --when she is not experiencing delerium from hypoxia or infections or from her chronic ILD.      I am aware she is going to need a higher level of care (SNF) and would recommend a PALLIATIVE CARE CONSULT wherever she goes.  I suspect she will get rehab / therapy days in the SNF and therefore could not get Hospice care during that time period anyway.    I am not sure of why pt and family have turned down hospice at the ALF and why they also cancelled a planned conversation with hospice liaison.  I will try to explore this.  It seems to me that some of patients dyspnea and other symptoms could (possibly ) be medically managed without her having  to come into the hospital each time.  Perhaps this will be easier to accomplish in a SNF as opposed to an ALF setting.     BRIEF HISTORY: Note is made that Palliative Care has previously seen pt in Jan 2016 (Dr Phifer --no access to note however in current EMR). She has had a DNR status for some time (it is not new --but no DNR form was sent with pt apparently).   Pt is a 82 yo woman who was sent from [facility] to the ED on 4/9 due to shortness of breath as well as some confusion. This is her third admission here since early March.  She was admitted 3/4 for not feeling well and having high blood pressure.  She was dehydrated and had a high potassium which was treated with kayexalate.  She was on Sprironolactone prior to this admission. Her acute on chronic renal failure responded to fluids and holding diuretic.  She was discharged two days later and her previous high doses of lasix were held.  She had one of two blood cultures turn up positive for staph aureus.  She was started on Cefazoling 1 g bid which was changed to Keflex orally for 7 days when discharged two days later.   She was re-admitted on 3/27due to some confusion and edema. CXR showed apparent CHF and she was  again noted to be hyperkalemic.  The losartan she was placed on was [facility].  She had been wheezing at the facility but not on arrival so nebs were continued.  She had been on a pureed diet but son reported pt can eat more regular textured foods.  Pt was able to handle mech soft diet with moistened foods and thin liquids.  She was again discharged two days later.  Discharge summary listed NSTEMI as a diagnosis for this admission, but I cannot locate progress notes detailing this.   Her chronic conditions that are noteworthy here include her suspected pulmonary fibrosis mentioned in outpatient cardiology notes, her recurrent problems handing diuretics resulting in hyperkalemia, her known bronchiectasis, her  increasing oxygen needs, and her normal EF on echo. Also of note, she has had a single positive blood culture and has had echos showing mitral valve chordae ('but cannot rule out vegetation).  She has grown more frail and has now gotten to where she will require a higher level of care.    Review of her old records show admissions for shortness of breath have taken place on the following dates:  09/29/13  -  pneumonia  01/28/14 -  pneumonia and left lung nodule noted along with INTERSTITIAL LUNG Dz  03/10/14 -    Acute diastolic CHF,dehydration, pneumonia, effusions (r side tapped)  08/10/14 -      Acute on chronic Diastolic CHF and COPD  03/04/15-   CHF, fall, syncope  06/07/15-       Dehydration and hyperkalemia due to diuretics  06/30/15-     CHF and hyperkalemia   07/10/15 -- Current admission for pnemonia/bronchiectasis, chf  IMPRESSION: Frequently recurring acute on chronic respiratory failure ---due to CHF and COPD (never smoker) and bibasilar pneumonia ---with hypoxemia ---followed as outpt by Dr Dema Severin, pulmonologist ---Dr Sung Amabile impression is Chronic Bronchiectasis with superimposed pulm edema as well as bronchospasm.  He does not feel there is much underlying chronic interstitial lung disease.  Bronchiectasis Acute on Chronic Diastolic CHF ---Followed by Dr Mariah Milling Suspected pulmonary fibrosis / interstitial lung disease ? --- bilateral interstitial thickening on CXR noted to be a chronic problem.  One of two blood cxs positive for staph aureus in during 3/4 admission Echo showing mitral valve chordae (but could not rule out vegetation) --see below ---was also seenon echo done 03/04/2015 Rheumatoid arthritis ---on Plaquenil Anxiety Hypthyroidsim HTN H/O prior pneumonias Gait disorder Weakness H/O bilateral pleural effusions Very hard of hearing ---have to write to her CAD ---evident on CT scans Dependent edema due to venous insufficiency Pulmonary HTN CKD stage  III Chronic low back pain (with h/o vertebroplasty and epidural injections for pain Depression Mild Malnutrition--present at admission Hyperglycemia  Anemia of unclear etiology   REVIEW OF SYSTEMS:  Patient is not able to provide ROS in detail due to illness and fatigue.   SPIRITUAL SUPPORT SYSTEM: Yes.  SOCIAL HISTORY:  reports that she has never smoked. She has never used smokeless tobacco. She reports that she does not drink alcohol or use illicit drugs.  She has been at ALF level of care and was getting home health there --but now requires a higher level of care than ALF.  Family had evidently declined hospice care a week prior to this admission at facility and it is also noted that ALF cannot take her back there due to her higher level of care needs.  She was using a rolling Miggins but now would require full supervision while ambulating with  the Kren.  Able to feed herself with setup. Very hard of hearing.  She lived by herself until sometime in 2016 apparently.   LEGAL DOCUMENTS:  I placed a completed portable DNR form in pt's chart.   CODE STATUS: DNR  PAST MEDICAL HISTORY: Past Medical History  Diagnosis Date  . COPD (chronic obstructive pulmonary disease) (HCC)   . CHF (congestive heart failure) (HCC)   . Hypertension   . Bilateral pneumonia   . Chronic respiratory failure (HCC)   . Bilateral pleural effusion   . Rheumatoid arthritis (HCC)   . Anxiety   . Hypothyroidism     PAST SURGICAL HISTORY:  Past Surgical History  Procedure Laterality Date  . Hip surgery      ALLERGIES:  is allergic to tramadol.  MEDICATIONS:  Current Facility-Administered Medications  Medication Dose Route Frequency Provider Last Rate Last Dose  . acetaminophen (TYLENOL) tablet 650 mg  650 mg Oral Q6H PRN Houston Siren, MD       Or  . acetaminophen (TYLENOL) suppository 650 mg  650 mg Rectal Q6H PRN Houston Siren, MD      . antiseptic oral rinse (CPC / CETYLPYRIDINIUM CHLORIDE  0.05%) solution 7 mL  7 mL Mouth Rinse BID Houston Siren, MD   7 mL at 07/15/15 1041  . enoxaparin (LOVENOX) injection 30 mg  30 mg Subcutaneous Q24H Houston Siren, MD   30 mg at 07/14/15 2015  . furosemide (LASIX) tablet 20 mg  20 mg Oral BID Adrian Saran, MD   20 mg at 07/15/15 1748  . guaifenesin (ROBITUSSIN) 100 MG/5ML syrup 300 mg  300 mg Oral BID Houston Siren, MD   300 mg at 07/15/15 1041  . haloperidol lactate (HALDOL) injection 2.5 mg  2.5 mg Intravenous Q6H PRN Houston Siren, MD      . hydrALAZINE (APRESOLINE) injection 10 mg  10 mg Intravenous Q6H PRN Houston Siren, MD      . hydroxychloroquine (PLAQUENIL) tablet 200 mg  200 mg Oral Daily Houston Siren, MD   200 mg at 07/15/15 1041  . ipratropium-albuterol (DUONEB) 0.5-2.5 (3) MG/3ML nebulizer solution 3 mL  3 mL Nebulization Q6H Houston Siren, MD   3 mL at 07/15/15 1339  . [START ON 07/16/2015] levofloxacin (LEVAQUIN) IVPB 500 mg  500 mg Intravenous Q48H Srikar Sudini, MD      . levothyroxine (SYNTHROID, LEVOTHROID) tablet 50 mcg  50 mcg Oral Q0600 Houston Siren, MD   50 mcg at 07/15/15 0441  . losartan (COZAAR) tablet 50 mg  50 mg Oral Daily Houston Siren, MD   50 mg at 07/15/15 1041  . magnesium oxide (MAG-OX) tablet 400 mg  400 mg Oral BID Cindi Carbon, RPH   400 mg at 07/15/15 1041  . methylPREDNISolone sodium succinate (SOLU-MEDROL) 40 mg/mL injection 40 mg  40 mg Intravenous Q12H Houston Siren, MD   40 mg at 07/15/15 0629  . morphine 2 MG/ML injection 1 mg  1 mg Intravenous Q3H PRN Houston Siren, MD   1 mg at 07/14/15 2015  . ondansetron (ZOFRAN) tablet 4 mg  4 mg Oral Q6H PRN Houston Siren, MD       Or  . ondansetron (ZOFRAN) injection 4 mg  4 mg Intravenous Q6H PRN Houston Siren, MD      . oxyCODONE-acetaminophen (PERCOCET/ROXICET) 5-325 MG per tablet 1 tablet  1 tablet Oral Q6H PRN Milagros Loll, MD  1 tablet at 07/15/15 1041  . PARoxetine (PAXIL) tablet 10 mg  10 mg Oral Daily Milagros Loll, MD   10 mg  at 07/15/15 1041  . vancomycin (VANCOCIN) IVPB 1000 mg/200 mL premix  1,000 mg Intravenous Once Jene Every, MD 200 mL/hr at 07/13/15 1831 1,000 mg at 07/13/15 1831    Vital Signs: BP 163/63 mmHg  Pulse 85  Temp(Src) 98.2 F (36.8 C) (Oral)  Resp 18  Ht 4\' 11"  (1.499 m)  Wt 62.324 kg (137 lb 6.4 oz)  BMI 27.74 kg/m2  SpO2 97%  LMP  (LMP Unknown) Filed Weights   07/13/15 1425 07/13/15 1902  Weight: 65.409 kg (144 lb 3.2 oz) 62.324 kg (137 lb 6.4 oz)    Estimated body mass index is 27.74 kg/(m^2) as calculated from the following:   Height as of this encounter: 4\' 11"  (1.499 m).   Weight as of this encounter: 62.324 kg (137 lb 6.4 oz).  PERFORMANCE STATUS (ECOG) : 3 - Symptomatic, >50% confined to bed  PHYSICAL EXAM: Sleeping a bit restlessly --as if dreaming  Eyes closed NAD No jVD --and she is lying flat Hrt rrr no m Lungs with decreased BS bases Abd soft nt Ext no cyanosis or mottling   LABS: CBC:    Component Value Date/Time   WBC 13.5* 07/15/2015 0532   WBC 4.5 03/27/2014 0504   HGB 9.2* 07/15/2015 0532   HGB 10.1* 03/27/2014 0504   HCT 28.3* 07/15/2015 0532   HCT 31.4* 03/27/2014 0504   PLT 261 07/15/2015 0532   PLT 153 03/27/2014 0504   MCV 96.1 07/15/2015 0532   MCV 98 03/27/2014 0504   NEUTROABS 12.9* 07/15/2015 0532   NEUTROABS 3.0 03/27/2014 0504   LYMPHSABS 0.1* 07/15/2015 0532   LYMPHSABS 0.6* 03/27/2014 0504   MONOABS 0.4 07/15/2015 0532   MONOABS 0.6 03/27/2014 0504   EOSABS 0.0 07/15/2015 0532   EOSABS 0.3 03/27/2014 0504   BASOSABS 0.0 07/15/2015 0532   BASOSABS 0.0 03/27/2014 0504   BASOSABS 0 03/22/2014 1117   Comprehensive Metabolic Panel:    Component Value Date/Time   NA 130* 07/15/2015 0532   NA 138 03/28/2014 0450   K 4.9 07/15/2015 0532   K 3.7 03/28/2014 0450   CL 94* 07/15/2015 0532   CL 97* 03/28/2014 0450   CO2 27 07/15/2015 0532   CO2 37* 03/28/2014 0450   BUN 56* 07/15/2015 0532   BUN 22* 03/28/2014 0450    CREATININE 1.72* 07/15/2015 0532   CREATININE 1.14 03/28/2014 0450   GLUCOSE 185* 07/15/2015 0532   GLUCOSE 103* 03/28/2014 0450   CALCIUM 7.8* 07/15/2015 0532   CALCIUM 8.4* 03/28/2014 0450   AST 19 07/13/2015 1439   AST 20 03/20/2014 1610   ALT 16 07/13/2015 1439   ALT 18 03/20/2014 1610   ALKPHOS 144* 07/13/2015 1439   ALKPHOS 77 03/20/2014 1610   BILITOT 0.7 07/13/2015 1439   BILITOT 0.3 03/20/2014 1610   PROT 7.5 07/13/2015 1439   PROT 7.3 03/20/2014 1610   ALBUMIN 3.4* 07/13/2015 1439   ALBUMIN 3.2* 03/20/2014 1610   ECHO 06/30/14: - Left ventricle: The cavity size was normal. Systolic function was  normal. The estimated ejection fraction was in the range of 60%  to 65%. Wall motion was normal; there were no regional wall  motion abnormalities. Left ventricular diastolic function  parameters were normal. - Aortic valve: There was mild regurgitation. - Left atrium: The atrium was normal in size. - Right ventricle: Systolic function  was normal. - Pulmonary arteries: Systolic pressure was mildly elevated. PA  peak pressure: 45 mm Hg (S). Impressions: - Mobile opacity noted off the posterior mitral valve leaflet,  likely a thickened cordea. Unable to exclude valve vegetation.  Clinical correlation suggested.     More than 50% of the visit was spent in counseling/coordination of care: Yes  Time Spent: 

## 2015-07-15 NOTE — Progress Notes (Signed)
[facility] [place] at [facility]   PATIENT NAME: Karen Bryan    MR#:  [id number]  DATE OF BIRTH:  [birth year]  SUBJECTIVE:  No acute events overnight.   REVIEW OF SYSTEMS:    Review of Systems  Unable to perform ROS: mental status change    DRUG ALLERGIES:   Allergies  Allergen Reactions  . Tramadol Nausea Only    VITALS:  Blood pressure 152/62, pulse 91, temperature 98.4 F (36.9 C), temperature source Oral, resp. rate 20, height 4\' 11"  (1.499 m), weight 62.324 kg (137 lb 6.4 oz), SpO2 90 %.  PHYSICAL EXAMINATION:   Physical Exam  Constitutional: She is well-developed, well-nourished, and in no distress. No distress.  HENT:  Head: Normocephalic.  Eyes: No scleral icterus.  Neck: Normal range of motion. Neck supple. No JVD present. No tracheal deviation present.  Cardiovascular: Normal rate, regular rhythm and normal heart sounds.  Exam reveals no gallop and no friction rub.   No murmur heard. Pulmonary/Chest: Effort normal. No respiratory distress. She has wheezes. She has no rales. She exhibits no tenderness.  Crackles at bases  Abdominal: Soft. Bowel sounds are normal. She exhibits no distension and no mass. There is no tenderness. There is no rebound and no guarding.  Musculoskeletal: Normal range of motion. She exhibits no edema.  Neurological: She is alert.  Skin: Skin is warm. No rash noted. No erythema.      LABORATORY PANEL:   CBC  Recent Labs Lab 07/15/15 0532  WBC 13.5*  HGB 9.2*  HCT 28.3*  PLT 261   ------------------------------------------------------------------------------------------------------------------ Chemistries   Recent Labs Lab 07/13/15 1439  07/15/15 0532  NA 135  < > 130*  K 4.9  < > 4.9  CL 99*  < > 94*  CO2 28  < > 27  GLUCOSE 172*  < > 185*  BUN 33*  < > 56*  CREATININE 1.51*  < > 1.72*  CALCIUM 8.4*  < > 7.8*  AST 19  --   --   ALT 16  --   --   ALKPHOS 144*  --   --   BILITOT  0.7  --   --   < > = values in this interval not displayed. ------------------------------------------------------------------------------------------------------------------  Cardiac Enzymes  Recent Labs Lab 07/13/15 1439  TROPONINI 0.06*   ------------------------------------------------------------------------------------------------------------------  RADIOLOGY:  Dg Chest 1 View  07/15/2015  CLINICAL DATA:  CHF, history of bilateral pneumonia, COPD, pleural effusions. EXAM: CHEST 1 VIEW COMPARISON:  Portable chest x-ray of July 13, 2015 FINDINGS: The lungs are adequately inflated. The interstitial markings remain increased. Bibasilar atelectasis and/or pneumonia is present. There is a small amount of fluid in the minor fissure. There is fluid blunting the lateral costophrenic gutter on the left. The cardiac silhouette is enlarged but its margins are indistinct. The pulmonary vascularity is engorged. The bony structures exhibit no acute abnormalities. IMPRESSION: CHF with mild pulmonary interstitial edema. Bibasilar atelectasis or pneumonia with small left pleural effusion. There has been slight interval improvement in the appearance of the right lung base since the previous study. Overall however there has not been dramatic interval change. Electronically Signed   By: David  July 15, 2015 M.D.   On: 07/15/2015 07:17   Dg Chest Portable 1 View  07/13/2015  CLINICAL DATA:  Shortness of Breath and hypoxia EXAM: PORTABLE CHEST 1 VIEW COMPARISON:  06/29/2015 FINDINGS: Cardiac shadow is stable. Persistent interstitial changes consistent with CHF are noted in slightly increased when  compare with the prior exam. There may be some acute superimposed infiltrate in the bases bilaterally as well. IMPRESSION: Increase in the degree of interstitial edema. There is also some suggestion of acute superimposed infiltrate in the bases bilaterally. Electronically Signed   By: Alcide Clever M.D.   On: 07/13/2015 14:57      ASSESSMENT AND PLAN:   82 year old female with past medical history of rheumatoid arthritis, CHF, COPD, history of bronchiectasis/ILD,  hard of hearing, anxiety, hypothyroidism who presented to the hospital with shortness of breath.  1. Acute on chronic respiratory failure with hypoxia: This is due to combination of  Diastolic CHF/pneumonia. Wean oxygen as tolerated. Nebulizers when necessary. Add ISS Pulmonary consult,as patient still with wheezing and not much improvement since admission.    2. Acute on chronic diastolic CHF  Echo from March 2017 reviewed. Ejection fraction 60% Unable to exclude valve vegetation clinical correlation recommeded Sodium decreasing and creatinine increasing.   change IV to by mouth Lasix. BMP for a.m. Patient is -400.  3. Bibasilar pneumonia with COPD exacerbation Continue Levaquin and vancomycin  Continue IV steroids  Continue inhalers and nebulizer treatments.  Wean oxygen as tolerated  Pulmonary consult placed.    4 CKD stage III is stable Monitor while being diureses  5. History of rheumatoid arthritis-continue Plaquenil.  6. Hypothyroidism-continue Synthroid.   DVT prophylaxis with Lovenox  All the records are reviewed and case discussed with Care Management. Management plans discussed with the patient   CODE STATUS: DNR  DVT Prophylaxis: SCDs  TOTAL TIME TAKING CARE OF THIS PATIENT: 28 minutes.   POSSIBLE D/C IN 2-3 DAYS, DEPENDING ON CLINICAL CONDITION. Home health PT;Supervision/Assistance - 24 hour (Patient will require higher level of care than after previous admission, would not recommend she ambulate without someone present)  Patient has overall poor prognosis with recurrent admissions. Palliative care consult pending  Aidel Davisson M.D on 07/15/2015 at 11:27 AM  Between 7am to 6pm - Pager - [phone]  After 6pm go to [email or web address] - password [email or web address] [facility]  [facility]  Office   [phone]  CC: Primary care physician; Leanna Sato, MD  Note: This dictation was prepared with Dragon dictation along with smaller phrase technology. Any transcriptional errors that result from this process are unintentional.

## 2015-07-15 NOTE — Consult Note (Signed)
PULMONARY CONSULT NOTE  Requesting MD/Service: M[facility]/Hospitalists Date of initial consultation: 07/15/15 Reason for consultation: acute on chronic hypoxic respiratory failure  HPI:  52 F seen previously by Dr Dema Severin as outpt for chronic multifactorial dyspnea and hypoxemia thought attributable to CHF and bronchiectasis. She is chronically frail and limited in her exertional tolerance and has had 2 hospitalizations in the past. She was admitted 07/13/15 with acute onset of dyspnea. She is very hard of hearing and I am unable to elicit any further history. There has been no fever since admission. The patient has a dry cough.  Past Medical History  Diagnosis Date  . COPD (chronic obstructive pulmonary disease) (HCC)   . CHF (congestive heart failure) (HCC)   . Hypertension   . Bilateral pneumonia   . Chronic respiratory failure (HCC)   . Bilateral pleural effusion   . Rheumatoid arthritis (HCC)   . Anxiety   . Hypothyroidism     Past Surgical History  Procedure Laterality Date  . Hip surgery      MEDICATIONS: I have reviewed all medications and confirmed regimen as documented  Social History   Social History  . Marital Status: Widowed    Spouse Name: N/A  . Number of Children: N/A  . Years of Education: N/A   Occupational History  . Not on file.   Social History Main Topics  . Smoking status: Never Smoker   . Smokeless tobacco: Never Used  . Alcohol Use: No  . Drug Use: No  . Sexual Activity: Not on file   Other Topics Concern  . Not on file   Social History Narrative    Family History  Problem Relation Age of Onset  . Pneumonia Mother   . Arthritis Father     ROS: Unable to obtain   Filed Vitals:   07/15/15 0217 07/15/15 0456 07/15/15 1041 07/15/15 1256  BP:  168/61 152/62 163/63  Pulse:  91  85  Temp:  98.4 F (36.9 C)  98.2 F (36.8 C)  TempSrc:  Oral  Oral  Resp:  20  18  Height:      Weight:      SpO2: 94% 90%  97%     EXAM:  Gen:  Elderly, sitting in recliner, NAD HEENT: NCAT, sclera white, oropharynx normal Neck: Supple without LAN, thyromegaly, JVD Lungs: breath sounds diminished, percussion note normal, diffuse wheezes and bilateral crackles Cardiovascular: Normal rate, reg rhythm, no murmurs noted Abdomen: Soft, nontender, normal BS Ext: without clubbing, cyanosis. 1+ symmetric edema Neuro: grossly intact Skin: Limited exam, no lesions noted  DATA:   BMP Latest Ref Rng 07/15/2015 07/14/2015 07/13/2015  Glucose 65 - 99 mg/dL 027(O) 536(U) 440(H)  BUN 6 - 20 mg/dL 47(Q) 25(Z) 56(L)  Creatinine 0.44 - 1.00 mg/dL 8.75(I) 4.33(I) 9.51(O)  Sodium 135 - 145 mmol/L 130(L) 135 135  Potassium 3.5 - 5.1 mmol/L 4.9 4.7 4.9  Chloride 101 - 111 mmol/L 94(L) 101 99(L)  CO2 22 - 32 mmol/L 27 30 28   Calcium 8.9 - 10.3 mg/dL 7.8(L) 7.8(L) 8.4(L)    CBC Latest Ref Rng 07/15/2015 07/14/2015 07/13/2015  WBC 3.6 - 11.0 K/uL 13.5(H) 13.5(H) 15.7(H)  Hemoglobin 12.0 - 16.0 g/dL 09/12/2015) 8.4(Z) 10.6(L)  Hematocrit 35.0 - 47.0 % 28.3(L) 28.2(L) 32.7(L)  Platelets 150 - 440 K/uL 261 211 225   CT chest 10/25/14: Extensive bronchiectasis throughout the lungs bilaterally, with severe bronchial wall thickening. No definite imaging findings to indicate interstitial lung disease on today's examination  CXR (07/13/15): bibasilar atx/infiltrates CXR (07/15/15): increased interstitial prominence R>L  IMPRESSION:   Acute on chronic hypoxemic respiratory failure Chronic bronchiectasis Superimposed pulmonary edema Acute bronchospasm/wheezing I don't think there's much underlying chronic interstitial lung disease AKI on CKD  PLAN:  Agree with levofloxacin for bronchiectasis - would complete 10 day course Agree with systemic steroids - would taper to off over 7-10 days Agree with nebulized bronchodilators. It would be appropriate for her to be discharged on these Continue diuresis to extent permitted by BP and renal function Continue  supplemental O2 to maintain SpO2 > 90% DNR appropriate for her as she would not survive intubation and ICU level of care in a favorable way Agree with Palliative Care consultation   Billy Fischer, MD PCCM service Mobile [phone] Pager [phone] 07/15/2015

## 2015-07-15 NOTE — Plan of Care (Signed)
Problem: Fluid Volume: Goal: Ability to maintain a balanced intake and output will improve Outcome: Not Progressing Needs encouragement to eat.  Had only bites for dinner.  Problem: Nutrition: Goal: Adequate nutrition will be maintained Outcome: Not Progressing Needs encouragement to eat.

## 2015-07-15 NOTE — Clinical Social Work Note (Signed)
Clinical Social Work Assessment  Patient Details  Name: Karen Bryan MRN: [id number] Date of Birth: Apr 18, 1919  Date of referral:  07/15/15               Reason for consult:  Facility Placement                Permission sought to share information with:  Facility Art therapist granted to share information::  Yes, Verbal Permission Granted  Name::     Daughter in law, Constance Holster   Housing/Transportation Living arrangements for the past 2 months:  [place] of Information:  Adult Children Patient Interpreter Needed:  None Criminal Activity/Legal Involvement Pertinent to Current Situation/Hospitalization:  No - Comment as needed Significant Relationships:  Adult Children Lives with:  Facility Resident Do you feel safe going back to the place where you live?  Yes Need for family participation in patient care:  Yes (Comment)  Care giving concerns:  Pt may need a higher level of care.   Social Worker assessment / plan:  CSW met with pt and daughter in law to address consult. CSW introduced herself and explained role of social work. Pt was admitted from [facility], however pt may not be able to return due to level of care needed. Pt's daughter in law was aware of this as she spoke with Scientist, physiological at [employer]. Pt's daughter in law and son were going to meet with [facility]osp[facility] in the morning, however that has been cancelled. Palliative Care consult is pending. CSW will initiate a SNF search for a higher level for with Hospice for additional options. CSW will continue to follow.   Employment status:  Retired Nurse, adult PT Recommendations:  Home with [place], [place] / Referral to community resources:  Other (Comment Required) ([place])  Patient/Family's Response to care:  Pt's daughter was appreciative of CSW support.   Patient/Family's Understanding of and Emotional Response to Diagnosis,  Current Treatment, and Prognosis:  Pt's daughter in law understands that pt is need a higher level of care.   Emotional Assessment Appearance:  Appears stated age Attitude/Demeanor/Rapport:  Other (Appropriate) Affect (typically observed):  Accepting, Pleasant Orientation:  Fluctuating Orientation (Suspected and/or reported Sundowners) Alcohol / Substance use:  Never Used Psych involvement (Current and /or in the community):  No (Comment)  Discharge Needs  Concerns to be addressed:  Adjustment to Illness Readmission within the last 30 days:  No Current discharge risk:  Chronically ill Barriers to Discharge:  Continued Medical Work up   Darden Dates, LCSW 07/15/2015, 5:34 PM

## 2015-07-15 NOTE — Care Management Important Message (Signed)
Important Message  Patient Details  Name: Karen Bryan MRN: [id number] Date of Birth: [birth year]   Medicare Important Message Given:  Yes    [name] A Makeshia Seat 07/15/2015, 2:02 PM

## 2015-07-16 ENCOUNTER — Inpatient Hospital Stay: Payer: Medicare Other

## 2015-07-16 DIAGNOSIS — I5021 Acute systolic (congestive) heart failure: Secondary | ICD-10-CM | POA: Insufficient documentation

## 2015-07-16 DIAGNOSIS — J811 Chronic pulmonary edema: Secondary | ICD-10-CM

## 2015-07-16 LAB — HEMOGLOBIN A1C: HEMOGLOBIN A1C: 6.4 % — AB (ref 4.0–6.0)

## 2015-07-16 LAB — BASIC METABOLIC PANEL
Anion gap: 8 (ref 5–15)
BUN: 66 mg/dL — ABNORMAL HIGH (ref 6–20)
CHLORIDE: 94 mmol/L — AB (ref 101–111)
CO2: 29 mmol/L (ref 22–32)
CREATININE: 1.75 mg/dL — AB (ref 0.44–1.00)
Calcium: 8.1 mg/dL — ABNORMAL LOW (ref 8.9–10.3)
GFR, EST AFRICAN AMERICAN: 28 mL/min — AB (ref >60)
GFR, EST NON AFRICAN AMERICAN: 24 mL/min — AB (ref >60)
Glucose, Bld: 151 mg/dL — ABNORMAL HIGH (ref 65–99)
Potassium: 5.7 mmol/L — ABNORMAL HIGH (ref 3.5–5.1)
SODIUM: 131 mmol/L — AB (ref 135–145)

## 2015-07-16 LAB — POTASSIUM
POTASSIUM: 5.8 mmol/L — AB (ref 3.5–5.1)
POTASSIUM: 5.2 mmol/L — AB (ref 3.5–5.1)

## 2015-07-16 MED ORDER — DEXTROSE 50 % IV SOLN
25.0000 mL | Freq: Once | INTRAVENOUS | Status: AC
  Administered 2015-07-16: 25 mL via INTRAVENOUS
  Filled 2015-07-16: qty 50

## 2015-07-16 MED ORDER — LEVOFLOXACIN 500 MG PO TABS
500.0000 mg | ORAL_TABLET | ORAL | Status: DC
  Administered 2015-07-16: 12:00:00 500 mg via ORAL
  Filled 2015-07-16: qty 1

## 2015-07-16 MED ORDER — METHYLPREDNISOLONE SODIUM SUCC 40 MG IJ SOLR
40.0000 mg | Freq: Every day | INTRAMUSCULAR | Status: DC
  Administered 2015-07-17: 09:00:00 40 mg via INTRAVENOUS
  Filled 2015-07-16: qty 1

## 2015-07-16 MED ORDER — BUDESONIDE 0.25 MG/2ML IN SUSP
0.2500 mg | Freq: Two times a day (BID) | RESPIRATORY_TRACT | Status: DC
  Administered 2015-07-16 – 2015-07-17 (×3): 0.25 mg via RESPIRATORY_TRACT
  Filled 2015-07-16 (×3): qty 2

## 2015-07-16 MED ORDER — SODIUM POLYSTYRENE SULFONATE 15 GM/60ML PO SUSP
30.0000 g | Freq: Once | ORAL | Status: AC
  Administered 2015-07-16: 30 g via ORAL
  Filled 2015-07-16: qty 120

## 2015-07-16 MED ORDER — METOPROLOL TARTRATE 50 MG PO TABS
50.0000 mg | ORAL_TABLET | Freq: Two times a day (BID) | ORAL | Status: DC
  Administered 2015-07-16 (×2): 50 mg via ORAL
  Filled 2015-07-16 (×2): qty 1

## 2015-07-16 MED ORDER — INSULIN REGULAR HUMAN 100 UNIT/ML IJ SOLN
10.0000 [IU] | Freq: Once | INTRAMUSCULAR | Status: AC
  Administered 2015-07-16: 10 [IU] via INTRAVENOUS
  Filled 2015-07-16 (×3): qty 0.1

## 2015-07-16 NOTE — Progress Notes (Signed)
Palliative Care Update:  I met with pt's son and daughter. We covered various scenarios for pt in terms of what might benefit her, what might happen despite our best efforts, where she might go, what goals of care she should have, etc.   The bottom line is that the family now hopes that pt can have short term rehab --hoping for very short rehab that might help pt . They definitely want pt to have a [place] while she gets the rehab at a SNF.   Then, they would like her to transition right away to Hospice care.    They do not want her getting a lot of labs or xrays going forward.  They understand that pt has a CXR today that is slightly worse than yesterday's and that her potassium is 5.8 today.  They don't really want this checked much at all --and hope that labs will stop entirely once she transitions over to being under Hospice care at whichever SNF she goes to.    Family has given me permission to adjust meds to essential meds only. I will go ahead and do the DC med rec once I have reviewed meds.   I have updated the Education officer, museum.  PT has agreed to re-evaluate the pt.     Colleen Can, MD

## 2015-07-16 NOTE — Progress Notes (Signed)
PT Cancellation Note  Patient Details Name: Karen Bryan MRN: [id number] DOB: Jan 19, 1920   Cancelled Treatment:    Reason Eval/Treat Not Completed: Other (comment). Pt currently not appropriate for therapy intervention at this time. Pt with elevated K+ at 5.7 and elevated BP: 198/65. Will re-attempt, next available date.   Madiline Saffran 07/16/2015, 9:57 AM  Elizabeth Palau, PT, DPT [phone]

## 2015-07-16 NOTE — Progress Notes (Addendum)
Palliative Medicine Inpatient Consult Follow Up Note   Name: Karen Bryan Date: 07/16/2015 MRN: [id number]  DOB: [birth year]  Referring Physician: Adrian Saran, MD  Palliative Care consult requested for this 82 y.o. female for goals of medical therapy in patient with many recurrent hospitalizations for acute on chronic resp failure.   TODAY'S CONVERSATIONS AND DECISIONS. 1. Pt is DNR and this continues. A portable form is now in the paper chart. She did not come with one apparently, though she is and has been DNR status for some time.  2.  Pt is not able to go back to ALF. She is in need of a much higher level of care.  SNF search will begin soon most likely (discussed with Child psychotherapist).  3.  Pt is not showing signs of being in need of Hospice Home at this time. She is improving and seems to be more appropriate for SNF with Hospice. The issue will be rehab with palliative care consult followed by admission / transition to hospice VS SNF with Hospice and no rehab days.    4.  I do need to talk to family about above and get their ideas of goals of care.  The info in the chart (and in my previous note) about family declining Hospice at the ALF a few weeks ago is NOT ACCURATE.  Pt was going to be set up with Hospice this week at the ALF. So I suspect this will be something family is willing to go forward with at a SNF --at some point.  5.  Pt may be able to handle some regular diet foods --but not now. She could not hold her fork correctly to get food in her mouth. She could not eat the spaghetti, the broccoli, the bread, or even the canned soft peaches that needed to be cut up first.  She cannot cut the food.. She had some trouble holding her tea also. AND she chewed forever --soft peaches!  She will need a dysphagia 11 diet with full assist during meals.   I will communicate to family.    6.  Pt's comfort issues may limit her life. She is often hyperkalemic and this seems to be caused by ANY dose  of lasix, ACEI or ARB.  She suffers with all the labs done to correct the high potassium. Better to not check it so often if comfort is desired. Will address issues such as this when family comes.   7.   I called and family is coming up here. Will talk with them and enter an update note soon.  -----------------------------------------------------------------------   IMPRESSION: Frequently recurring acute on chronic respiratory failure ---due to CHF and COPD (never smoker) and bibasilar pneumonia ---with hypoxemia ---followed as outpt by Dr Dema Severin, pulmonologist ---Dr Sung Amabile impression is Chronic Bronchiectasis with superimposed pulm edema as well as bronchospasm. He does not feel there is much underlying chronic interstitial lung disease.  Bronchiectasis Acute on Chronic Diastolic CHF ---Followed by Dr Mariah Milling Suspected pulmonary fibrosis / interstitial lung disease ? --- bilateral interstitial thickening on CXR noted to be a chronic problem.  One of two blood cxs positive for staph aureus in during 3/4 admission Echo showing mitral valve chordae (but could not rule out vegetation) --see below ---was also seenon echo done 03/04/2015 Rheumatoid arthritis ---on Plaquenil Anxiety Hypthyroidsim HTN H/O prior pneumonias Gait disorder Weakness H/O bilateral pleural effusions Very hard of hearing ---have to write to her CAD ---evident on CT scans Dependent edema due to  venous insufficiency Pulmonary HTN CKD stage III Chronic low back pain (with h/o vertebroplasty and epidural injections for pain Depression Mild Malnutrition--present at admission Hyperglycemia  Anemia of unclear etiology  REVIEW OF SYSTEMS:  Patient is not able to provide ROS due to her weakness, her deafness, her inability to read and process what I wrote to her.    CODE STATUS: DNR   PAST MEDICAL HISTORY: Past Medical History  Diagnosis Date  . COPD (chronic obstructive pulmonary disease) (HCC)   .  CHF (congestive heart failure) (HCC)   . Hypertension   . Bilateral pneumonia   . Chronic respiratory failure (HCC)   . Bilateral pleural effusion   . Rheumatoid arthritis (HCC)   . Anxiety   . Hypothyroidism     PAST SURGICAL HISTORY:  Past Surgical History  Procedure Laterality Date  . Hip surgery      Vital Signs: BP 159/69 mmHg  Pulse 78  Temp(Src) 98.7 F (37.1 C) (Oral)  Resp 20  Ht 4\' 11"  (1.499 m)  Wt 62.324 kg (137 lb 6.4 oz)  BMI 27.74 kg/m2  SpO2 95%  LMP  (LMP Unknown) Filed Weights   07/13/15 1425 07/13/15 1902  Weight: 65.409 kg (144 lb 3.2 oz) 62.324 kg (137 lb 6.4 oz)    Estimated body mass index is 27.74 kg/(m^2) as calculated from the following:   Height as of this encounter: 4\' 11"  (1.499 m).   Weight as of this encounter: 62.324 kg (137 lb 6.4 oz).  PHYSICAL EXAM: Up in bedside chair ---a little tremulous and shaky and leans to the left very easily EOMI Speaks to me in short sentences, but though she says she can hear me 'some'---but she cannot b/c her answers do not match up with my questions.  I gave her a few handwritten notes with printed words that are in large print. She just stared at these for a very, very long time.  It wasn't like she couldn't see the words --but more like she could not process the words. Lab tech was trying to get blood.  She is a very hard stick!  Thumbprick was done to get this follow up potassium lab and pt grimaced a lot during this procedure that required that her thumb be squeezed often just to get enough blood for the lab. No JVD seen  Hrt rrr with irreg beats Lungs sound cta now abd soft and NT Ext no cyanosis or mottling. Shaky hands at rest AND with intention She has severe deformities of fingers c/w Rheum Arthritis  (she has known RA and is on Plaquenil) It was very difficult for her to hold her fork and her tea correctly today though family has stated in the past she has been able to do all of this  herself. She could not eat the spaghetti, the broccoli, the bread or even the canned peaches. She required help and when I fed her the peach (cut up) she chewed for a very long time (40 or so chews for one mouthful).  LABS: CBC:    Component Value Date/Time   WBC 13.5* 07/15/2015 0532   WBC 4.5 03/27/2014 0504   HGB 9.2* 07/15/2015 0532   HGB 10.1* 03/27/2014 0504   HCT 28.3* 07/15/2015 0532   HCT 31.4* 03/27/2014 0504   PLT 261 07/15/2015 0532   PLT 153 03/27/2014 0504   MCV 96.1 07/15/2015 0532   MCV 98 03/27/2014 0504   NEUTROABS 12.9* 07/15/2015 0532   NEUTROABS 3.0 03/27/2014  0504   LYMPHSABS 0.1* 07/15/2015 0532   LYMPHSABS 0.6* 03/27/2014 0504   MONOABS 0.4 07/15/2015 0532   MONOABS 0.6 03/27/2014 0504   EOSABS 0.0 07/15/2015 0532   EOSABS 0.3 03/27/2014 0504   BASOSABS 0.0 07/15/2015 0532   BASOSABS 0.0 03/27/2014 0504   BASOSABS 0 03/22/2014 1117   Comprehensive Metabolic Panel:    Component Value Date/Time   NA 131* 07/16/2015 0452   NA 138 03/28/2014 0450   K 5.7* 07/16/2015 0452   K 3.7 03/28/2014 0450   CL 94* 07/16/2015 0452   CL 97* 03/28/2014 0450   CO2 29 07/16/2015 0452   CO2 37* 03/28/2014 0450   BUN 66* 07/16/2015 0452   BUN 22* 03/28/2014 0450   CREATININE 1.75* 07/16/2015 0452   CREATININE 1.14 03/28/2014 0450   GLUCOSE 151* 07/16/2015 0452   GLUCOSE 103* 03/28/2014 0450   CALCIUM 8.1* 07/16/2015 0452   CALCIUM 8.4* 03/28/2014 0450   AST 19 07/13/2015 1439   AST 20 03/20/2014 1610   ALT 16 07/13/2015 1439   ALT 18 03/20/2014 1610   ALKPHOS 144* 07/13/2015 1439   ALKPHOS 77 03/20/2014 1610   BILITOT 0.7 07/13/2015 1439   BILITOT 0.3 03/20/2014 1610   PROT 7.5 07/13/2015 1439   PROT 7.3 03/20/2014 1610   ALBUMIN 3.4* 07/13/2015 1439   ALBUMIN 3.2* 03/20/2014 1610    More than 50% of the visit was spent in counseling/coordination of care: YES  Time Spent:  65 min

## 2015-07-16 NOTE — Progress Notes (Signed)
PULMONARY CONSULT NOTE  Requesting MD/Service: [facility]/Hospitalists Date of initial consultation: 07/15/15 Reason for consultation: acute on chronic hypoxic respiratory failure  HPI:  72 F seen previously by Dr Dema Severin as outpt for chronic multifactorial dyspnea and hypoxemia thought attributable to CHF and bronchiectasis. She is chronically frail and limited in her exertional tolerance and has had 2 hospitalizations in the past. She was admitted 07/13/15 with acute onset of dyspnea. She is very hard of hearing and I am unable to elicit any further history. There has been no fever since admission. The patient has a dry cough.  SUBJ: No distress. No new complaints. Communication remains hampered by severe hearing deficit  Filed Vitals:   07/16/15 0513 07/16/15 0731 07/16/15 0831 07/16/15 1156  BP:   198/65 162/65  Pulse: 83  85 75  Temp:      TempSrc:      Resp:   20   Height:      Weight:      SpO2: 93% 94% 91% 98%     EXAM:  Gen: NAD HEENT: WNL Lungs: improved to resolved wheezes and crackles Cardiovascular: Reg, no M Abdomen: Soft, nontender, normal BS Ext: without clubbing, cyanosis. 1+ symmetric edema Neuro: grossly intact  DATA:   BMP Latest Ref Rng 07/16/2015 07/15/2015 07/14/2015  Glucose 65 - 99 mg/dL 263(Z) 858(I) 502(D)  BUN 6 - 20 mg/dL 74(J) 28(N) 86(V)  Creatinine 0.44 - 1.00 mg/dL 6.72(C) 9.47(S) 9.62(E)  Sodium 135 - 145 mmol/L 131(L) 130(L) 135  Potassium 3.5 - 5.1 mmol/L 5.7(H) 4.9 4.7  Chloride 101 - 111 mmol/L 94(L) 94(L) 101  CO2 22 - 32 mmol/L 29 27 30   Calcium 8.9 - 10.3 mg/dL 8.1(L) 7.8(L) 7.8(L)    CBC Latest Ref Rng 07/15/2015 07/14/2015 07/13/2015  WBC 3.6 - 11.0 K/uL 13.5(H) 13.5(H) 15.7(H)  Hemoglobin 12.0 - 16.0 g/dL 09/12/2015) 3.6(O) 10.6(L)  Hematocrit 35.0 - 47.0 % 28.3(L) 28.2(L) 32.7(L)  Platelets 150 - 440 K/uL 261 211 225   CXR: edema pattern   IMPRESSION:   Acute on chronic hypoxemic respiratory failure due to severe bronchiectasis, acute  on chronic pulmonary edema. Acute bronchospasm seems resolved on today's exam.   PLAN:  Complete 7-10 day course of levofloxacin for bronchiectasis Taper systemic steroids to off over 7-10 days Cont nebulized bronchodilators. Will add nebulized steroids. It would be appropriate for her to be discharged on these Continue diuresis to extent permitted by BP and renal function Continue supplemental O2 to maintain SpO2 > 90% Palliative Care input noted and aprpeciated   PCCM will sign off. Please call if we can be of further assistance  2.9(U, MD PCCM service Mobile [phone] Pager [phone] 07/16/2015

## 2015-07-16 NOTE — Progress Notes (Addendum)
Pharmacy Antibiotic Note  Karen Bryan is a 82 y.o. female admitted on 07/13/2015 with pneumonia.  Pharmacy has been consulted for levofloxacin dosing.  Plan: Continue Levaquin 500 mg iv q 48 hours. After discussion with Dr. Juliene Pina, will continue Levaquin for a total of 10 days.    Height: 4\' 11"  (149.9 cm) Weight: 137 lb 6.4 oz (62.324 kg) IBW/kg (Calculated) : 43.2  Temp (24hrs), Avg:98.2 F (36.8 C), Min:97.7 F (36.5 C), Max:98.6 F (37 C)   Recent Labs Lab 07/13/15 1439 07/14/15 0045 07/15/15 0532 07/16/15 0452  WBC 15.7* 13.5* 13.5*  --   CREATININE 1.51* 1.47* 1.72* 1.75*    Estimated Creatinine Clearance: 16.1 mL/min (by C-G formula based on Cr of 1.75).    Allergies  Allergen Reactions  . Tramadol Nausea Only   Thank you for allowing pharmacy to be a part of this patient's care.  09/15/15 D, PharmD 07/16/2015 10:02 AM

## 2015-07-16 NOTE — Progress Notes (Signed)
[facility] [place] [facility]   PATIENT NAME: Karen Bryan    MR#:  [id number]  DATE OF BIRTH:  04-10-1919  SUBJECTIVE:   Potassium level elevated this morning. Patient voices no complaints. She is hard of hearing.   REVIEW OF SYSTEMS:    Review of Systems  Constitutional: Negative for fever, chills and malaise/fatigue.  HENT: Positive for hearing loss. Negative for ear discharge, ear pain, nosebleeds and sore throat.   Eyes: Negative for blurred vision and pain.  Respiratory: Positive for cough and shortness of breath. Negative for hemoptysis and wheezing.   Cardiovascular: Negative for chest pain, palpitations and leg swelling.  Gastrointestinal: Negative for nausea, vomiting, abdominal pain, diarrhea and blood in stool.  Genitourinary: Negative for dysuria.  Musculoskeletal: Negative for back pain.  Neurological: Negative for dizziness, tremors, speech change, focal weakness, seizures and headaches.  Endo/Heme/Allergies: Does not bruise/bleed easily.  Psychiatric/Behavioral: Negative for depression, suicidal ideas and hallucinations.    DRUG ALLERGIES:   Allergies  Allergen Reactions  . Tramadol Nausea Only    VITALS:  Blood pressure 198/65, pulse 85, temperature 97.7 F (36.5 C), temperature source Oral, resp. rate 20, height 4\' 11"  (1.499 m), weight 62.324 kg (137 lb 6.4 oz), SpO2 91 %.  PHYSICAL EXAMINATION:   Physical Exam  Constitutional: She is well-developed, well-nourished, and in no distress. No distress.  HENT:  Head: Normocephalic.  Eyes: No scleral icterus.  Neck: Normal range of motion. Neck supple. No JVD present. No tracheal deviation present.  Cardiovascular: Normal rate, regular rhythm and normal heart sounds.  Exam reveals no gallop and no friction rub.   No murmur heard. Pulmonary/Chest: Effort normal. No respiratory distress. She has wheezes. She has no rales. She exhibits no tenderness.  Crackles at bases  bilateral  Abdominal: Soft. Bowel sounds are normal. She exhibits no distension and no mass. There is no tenderness. There is no rebound and no guarding.  Musculoskeletal: Normal range of motion. She exhibits no edema.  Neurological: She is alert.  Skin: Skin is warm. No rash noted. No erythema.      LABORATORY PANEL:   CBC  Recent Labs Lab 07/15/15 0532  WBC 13.5*  HGB 9.2*  HCT 28.3*  PLT 261   ------------------------------------------------------------------------------------------------------------------ Chemistries   Recent Labs Lab 07/13/15 1439  07/16/15 0452  NA 135  < > 131*  K 4.9  < > 5.7*  CL 99*  < > 94*  CO2 28  < > 29  GLUCOSE 172*  < > 151*  BUN 33*  < > 66*  CREATININE 1.51*  < > 1.75*  CALCIUM 8.4*  < > 8.1*  AST 19  --   --   ALT 16  --   --   ALKPHOS 144*  --   --   BILITOT 0.7  --   --   < > = values in this interval not displayed. ------------------------------------------------------------------------------------------------------------------  Cardiac Enzymes  Recent Labs Lab 07/13/15 1439  TROPONINI 0.06*   ------------------------------------------------------------------------------------------------------------------  RADIOLOGY:  Dg Chest 1 View  07/15/2015  CLINICAL DATA:  CHF, history of bilateral pneumonia, COPD, pleural effusions. EXAM: CHEST 1 VIEW COMPARISON:  Portable chest x-ray of July 13, 2015 FINDINGS: The lungs are adequately inflated. The interstitial markings remain increased. Bibasilar atelectasis and/or pneumonia is present. There is a small amount of fluid in the minor fissure. There is fluid blunting the lateral costophrenic gutter on the left. The cardiac silhouette is enlarged but its margins  are indistinct. The pulmonary vascularity is engorged. The bony structures exhibit no acute abnormalities. IMPRESSION: CHF with mild pulmonary interstitial edema. Bibasilar atelectasis or pneumonia with small left pleural  effusion. There has been slight interval improvement in the appearance of the right lung base since the previous study. Overall however there has not been dramatic interval change. Electronically Signed   By: David  [place] M.D.   On: 07/15/2015 07:17     ASSESSMENT AND PLAN:   82 year old female with past medical history of rheumatoid arthritis, CHF, COPD, history of bronchiectasis/ILD,  hard of hearing, anxiety, hypothyroidism who presented to the hospital with shortness of breath.  1. Acute on chronic respiratory failure with hypoxia: This is due to combination of  Diastolic CHF/pneumonia/bronchiectasis. Wean oxygen as tolerated. Appreciate pulmonary consultation. No new recommendations.  2. Acute on chronic diastolic CHF  Echo from March 2017 reviewed. Ejection fraction 60% Unable to exclude valve vegetation clinical correlation recommeded I will stop Lasix for today. Creatinine is increasing.  3. Bibasilar pneumonia with COPD exacerbation and bronchiectasis Continue Levaquin and discontinue vancomycin  Wean IV steroids  Continue inhalers and nebulizer treatments.  Wean oxygen as tolerated      4 CKD stage III: Creatinine 1.75 today. Discontinue losartan and Lasix. Follow up on creatinine in a.m. 5. History of rheumatoid arthritis-continue Plaquenil.  6. Hypothyroidism-continue Synthroid.  7. Hyperkalemia: This was treated with Kayexalate, insulin and dextrose. Repeat potassium at noon.   DVT prophylaxis with Lovenox  All the records are reviewed and case discussed with Care Management. Management plans discussed with the patient   CODE STATUS: DNR  DVT Prophylaxis: SCDs  TOTAL TIME TAKING CARE OF THIS PATIENT: 28 minutes.   POSSIBLE D/C IN 2-3 DAYS, DEPENDING ON CLINICAL CONDITION.  Home health PT;Supervision/Assistance - 24 hour (Patient will require higher level of care than after previous admission, would not recommend she ambulate without someone  present)  Patient has overall poor prognosis with recurrent admissions. Palliative care consult placed. Dr. Orvan Falconer to discuss hospice/palliative care with family.  Caro Brundidge M.D on 07/16/2015 at 10:48 AM  Between 7am to 6pm - Pager - [phone]  After 6pm go to [email or web address] - password [email or web address] [facility]  [place]  Office  [phone]  CC: Primary care physician; Leanna Sato, MD  Note: This dictation was prepared with Dragon dictation along with smaller phrase technology. Any transcriptional errors that result from this process are unintentional.

## 2015-07-16 NOTE — Progress Notes (Signed)
Dr. Juliene Pina made aware that pt's potassium is still 5.8 however pt just had a very large BM.  Dr. Juliene Pina made aware of this.  New order given for potassium to be rechecked in 1 hour.  Orson Ape, RN

## 2015-07-17 LAB — BASIC METABOLIC PANEL
ANION GAP: 5 (ref 5–15)
BUN: 60 mg/dL — ABNORMAL HIGH (ref 6–20)
CALCIUM: 8 mg/dL — AB (ref 8.9–10.3)
CO2: 35 mmol/L — ABNORMAL HIGH (ref 22–32)
CREATININE: 1.38 mg/dL — AB (ref 0.44–1.00)
Chloride: 95 mmol/L — ABNORMAL LOW (ref 101–111)
GFR, EST AFRICAN AMERICAN: 37 mL/min — AB (ref >60)
GFR, EST NON AFRICAN AMERICAN: 32 mL/min — AB (ref >60)
Glucose, Bld: 89 mg/dL (ref 65–99)
Potassium: 3.8 mmol/L (ref 3.5–5.1)
SODIUM: 135 mmol/L (ref 135–145)

## 2015-07-17 MED ORDER — BISACODYL 10 MG RE SUPP: 10.0000 mg | Freq: Every day | RECTAL | Status: AC | PRN

## 2015-07-17 MED ORDER — IPRATROPIUM-ALBUTEROL 0.5-2.5 (3) MG/3ML IN SOLN: 3.0000 mL | RESPIRATORY_TRACT | Status: DC | PRN

## 2015-07-17 MED ORDER — MORPHINE SULFATE (CONCENTRATE) 10 MG/0.5ML PO SOLN
2.6000 mg | Freq: Three times a day (TID) | ORAL | Status: DC
  Administered 2015-07-17: 11:00:00 2.6 mg via SUBLINGUAL
  Filled 2015-07-17: qty 0.5

## 2015-07-17 MED ORDER — MORPHINE SULFATE (CONCENTRATE) 10 MG/0.5ML PO SOLN: 5.0000 mg | ORAL | Status: AC | PRN

## 2015-07-17 MED ORDER — MORPHINE SULFATE (CONCENTRATE) 10 MG/0.5ML PO SOLN: 5.0000 mg | ORAL | Status: DC | PRN

## 2015-07-17 MED ORDER — LORAZEPAM 0.5 MG PO TABS: 0.5000 mg | ORAL_TABLET | ORAL | Status: AC | PRN

## 2015-07-17 MED ORDER — IPRATROPIUM-ALBUTEROL 0.5-2.5 (3) MG/3ML IN SOLN: 3.0000 mL | RESPIRATORY_TRACT | Status: AC | PRN

## 2015-07-17 MED ORDER — PROCHLORPERAZINE 25 MG RE SUPP: 25.0000 mg | Freq: Three times a day (TID) | RECTAL | Status: AC | PRN

## 2015-07-17 MED ORDER — LORAZEPAM 0.5 MG PO TABS: 0.5000 mg | ORAL_TABLET | ORAL | Status: DC | PRN

## 2015-07-17 MED ORDER — BISACODYL 10 MG RE SUPP: 10.0000 mg | Freq: Every day | RECTAL | Status: DC | PRN

## 2015-07-17 MED ORDER — PROCHLORPERAZINE 25 MG RE SUPP
25.0000 mg | Freq: Three times a day (TID) | RECTAL | Status: DC | PRN
  Filled 2015-07-17: qty 1

## 2015-07-17 MED ORDER — ACETAMINOPHEN 650 MG RE SUPP: 650.0000 mg | RECTAL | Status: AC | PRN

## 2015-07-17 MED ORDER — FUROSEMIDE 10 MG/ML IJ SOLN
40.0000 mg | Freq: Once | INTRAMUSCULAR | Status: AC
  Administered 2015-07-17: 40 mg via INTRAVENOUS
  Filled 2015-07-17: qty 4

## 2015-07-17 MED ORDER — METOPROLOL TARTRATE 50 MG PO TABS: 50.0000 mg | ORAL_TABLET | Freq: Two times a day (BID) | ORAL | Status: AC

## 2015-07-17 NOTE — [place] FL2 (Signed)
[place] MEDICAID FL2 LEVEL OF CARE SCREENING TOOL     IDENTIFICATION  Patient Name: Karen Bryan Birthdate: Dec 12, 1919 Sex: female Admission Date (Current Location): 07/13/2015  [facility] and [place] Number:  Chiropodist and Address:  [facility], [address] [place], [place] [address]      Provider Number: [id number]  Attending Physician Name and Address:  Adrian Saran, MD  Relative Name and Phone Number:       Current Level of Care: Hospital Recommended Level of Care: Skilled Nursing Facility Prior Approval Number:    Date Approved/Denied:   PASRR Number:    Discharge Plan: SNF    Current Diagnoses: Patient Active Problem List   Diagnosis Date Noted  . Acute systolic congestive heart failure (HCC)   . Acute on chronic respiratory failure (HCC) 07/13/2015  . Acute CHF (congestive heart failure) (HCC) 06/29/2015  . Hyperkalemia 06/07/2015  . Uncontrolled hypertension 06/07/2015  . Acute on chronic renal failure (HCC) 06/07/2015  . Fall 03/04/2015  . Fluid excess 03/04/2015  . CHF (congestive heart failure) (HCC) 03/04/2015  . Cough 10/03/2014  . Dyspnea 10/03/2014  . ILD (interstitial lung disease) (HCC) 10/03/2014  . COPD exacerbation (HCC) 09/26/2014  . Hard of hearing 09/26/2014  . Hypertensive urgency 08/10/2014  . Acute on chronic diastolic heart failure (HCC) 08/10/2014  . Hypertension, essential 08/10/2014  . Rheumatoid arthritis (HCC) 08/10/2014  . COPD (chronic obstructive pulmonary disease) (HCC) 08/10/2014    Orientation RESPIRATION BLADDER Height & Weight     Self  O2 (2L) Incontinent Weight: 137 lb 6.4 oz (62.324 kg) Height:  4\' 11"  (149.9 cm)  BEHAVIORAL SYMPTOMS/MOOD NEUROLOGICAL BOWEL NUTRITION STATUS      Continent Diet (DYS 3, Thin Liquids)  AMBULATORY STATUS COMMUNICATION OF NEEDS Skin   Extensive Assist Verbally Normal                       Personal Care Assistance Level of Assistance   Bathing, Feeding, Dressing Bathing Assistance: Maximum assistance Feeding assistance: Limited assistance Dressing Assistance: Maximum assistance     Functional Limitations Info  Sight, Hearing, Speech Sight Info: Adequate Hearing Info: Impaired Speech Info: Adequate    SPECIAL CARE FACTORS FREQUENCY  PT (By licensed PT)     PT Frequency: 5              Contractures      Additional Factors Info  Code Status, Allergies Code Status Info: DNR Allergies Info: Tramadol           Current Medications (07/17/2015):  This is the current hospital active medication list Current Facility-Administered Medications  Medication Dose Route Frequency Provider Last Rate Last Dose  . acetaminophen (TYLENOL) tablet 650 mg  650 mg Oral Q6H PRN 07/19/2015, MD       Or  . acetaminophen (TYLENOL) suppository 650 mg  650 mg Rectal Q6H PRN Houston Siren, MD      . antiseptic oral rinse (CPC / CETYLPYRIDINIUM CHLORIDE 0.05%) solution 7 mL  7 mL Mouth Rinse BID Houston Siren, MD   7 mL at 07/16/15 2045  . budesonide (PULMICORT) nebulizer solution 0.25 mg  0.25 mg Nebulization BID 2046, MD   0.25 mg at 07/17/15 07/19/15  . enoxaparin (LOVENOX) injection 30 mg  30 mg Subcutaneous Q24H 7121, MD   30 mg at 07/16/15 2044  . guaifenesin (ROBITUSSIN) 100 MG/5ML syrup 300 mg  300 mg  Oral BID Houston Siren, MD   300 mg at 07/16/15 2043  . hydrALAZINE (APRESOLINE) injection 10 mg  10 mg Intravenous Q6H PRN Houston Siren, MD      . hydroxychloroquine (PLAQUENIL) tablet 200 mg  200 mg Oral Daily Houston Siren, MD   200 mg at 07/16/15 0836  . ipratropium-albuterol (DUONEB) 0.5-2.5 (3) MG/3ML nebulizer solution 3 mL  3 mL Nebulization Q6H Houston Siren, MD   3 mL at 07/17/15 0808  . levofloxacin (LEVAQUIN) tablet 500 mg  500 mg Oral Q48H Sital Mody, MD   500 mg at 07/16/15 1157  . levothyroxine (SYNTHROID, LEVOTHROID) tablet 50 mcg  50 mcg Oral Q0600 Houston Siren, MD   50  mcg at 07/17/15 0612  . magnesium oxide (MAG-OX) tablet 400 mg  400 mg Oral BID Cindi Carbon, RPH   400 mg at 07/16/15 2044  . methylPREDNISolone sodium succinate (SOLU-MEDROL) 40 mg/mL injection 40 mg  40 mg Intravenous Daily Adrian Saran, MD   40 mg at 07/17/15 0928  . metoprolol (LOPRESSOR) tablet 50 mg  50 mg Oral BID Adrian Saran, MD   50 mg at 07/16/15 2044  . morphine 2 MG/ML injection 1 mg  1 mg Intravenous Q3H PRN Houston Siren, MD   1 mg at 07/14/15 2015  . ondansetron (ZOFRAN) tablet 4 mg  4 mg Oral Q6H PRN Houston Siren, MD       Or  . ondansetron (ZOFRAN) injection 4 mg  4 mg Intravenous Q6H PRN Houston Siren, MD      . oxyCODONE-acetaminophen (PERCOCET/ROXICET) 5-325 MG per tablet 1 tablet  1 tablet Oral Q6H PRN Milagros Loll, MD   1 tablet at 07/16/15 0836  . PARoxetine (PAXIL) tablet 10 mg  10 mg Oral Daily Milagros Loll, MD   10 mg at 07/16/15 5573     Discharge Medications: Please see discharge summary for a list of discharge medications.  Relevant Imaging Results:  Relevant Lab Results:   Additional Information SSN:  [id number]  Dede Query, LCSW

## 2015-07-17 NOTE — Progress Notes (Signed)
Spoke with Karen Bryan, [facility] rep at [phone], to notify of non-emergent EMS transport.  Auth notification reference given as [id number].   Service date range good from 07/17/15 - 10/15/15.   Gap exception requested to determine if services can be considered at an in-network level.

## 2015-07-17 NOTE — Progress Notes (Signed)
Pt is being discharged to hospice home. Discharge packet in pt's slot. Awaiting EMS. 

## 2015-07-17 NOTE — Clinical Social Work Placement (Signed)
   CLINICAL SOCIAL WORK PLACEMENT  NOTE  Date:  07/17/2015  Patient Details  Name: Karen Bryan MRN: [id number] Date of Birth: Jul 09, 1919  Clinical Social Work is seeking post-discharge placement for this patient at the Skilled  Nursing Facility level of care (*CSW will initial, date and re-position this form in  chart as items are completed):  Yes   Patient/family provided with [facility] Clinical Social Work Department's list of facilities offering this level of care within the geographic area requested by the patient (or if unable, by the patient's family).  Yes   Patient/family informed of their freedom to choose among providers that offer the needed level of care, that participate in Medicare, Medicaid or managed care program needed by the patient, have an available bed and are willing to accept the patient.  Yes   Patient/family informed of [facility]'s ownership interest in [facility] and [facility] as well as of the fact that they are under no obligation to receive care at these facilities.  PASRR submitted to EDS on 07/17/15     PASRR number received on       Existing PASRR number confirmed on       FL2 transmitted to all facilities in geographic area requested by pt/family on 07/17/15     FL2 transmitted to all facilities within larger geographic area on       Patient informed that his/her managed care company has contracts with or will negotiate with certain facilities, including the following:            Patient/family informed of bed offers received.  Patient chooses bed at       Physician recommends and patient chooses bed at      Patient to be transferred to   on  .  Patient to be transferred to facility by       Patient family notified on   of transfer.  Name of family member notified:        PHYSICIAN       Additional Comment:    _______________________________________________ Dede Query, LCSW 07/17/2015, 9:28 AM

## 2015-07-17 NOTE — Clinical Social Work Note (Signed)
Pt is ready for discharge today and will go to [facility]. CSW prepared discharge packet. Hospital Liaison made arrangements for transfer. EMS will provide transportation. Pt's son is in agreement for discharge to [facility]. CSW is signing off as no further needs identified.   Dede Query, MSW, LCSW Clinical Social Worker  [phone]

## 2015-07-17 NOTE — Discharge Summary (Signed)
[facility] [place] [facility]   PATIENT NAME: Karen Bryan    MR#:  [id number]  DATE OF BIRTH:  [birth year]  DATE OF ADMISSION:  07/13/2015 ADMITTING PHYSICIAN: Houston Siren, MD  DATE OF DISCHARGE: 07/17/2015 PRIMARY CARE PHYSICIAN: Leanna Sato, MD    ADMISSION DIAGNOSIS:  Acute systolic congestive heart failure (HCC) [I50.21]  DISCHARGE DIAGNOSIS:  Active Problems:   Acute on chronic respiratory failure (HCC)   Acute systolic congestive heart failure (HCC)   SECONDARY DIAGNOSIS:   Past Medical History  Diagnosis Date  . COPD (chronic obstructive pulmonary disease) (HCC)   . CHF (congestive heart failure) (HCC)   . Hypertension   . Bilateral pneumonia   . Chronic respiratory failure (HCC)   . Bilateral pleural effusion   . Rheumatoid arthritis (HCC)   . Anxiety   . Hypothyroidism     HOSPITAL COURSE:   82 year old female with past medical history of rheumatoid arthritis, CHF, COPD, history of bronchiectasis/ILD, hard of hearing, anxiety, hypothyroidism who presented to the hospital with shortness of breath.  1. Acute on chronic respiratory failure with hypoxia: This is due to combination of Diastolic CHF/pneumonia/bronchiectasis. She was treated for these issues. However on the morning of April 13 patient was having more dyspnea and hypoxia. She was given 40 mg of IV Lasix but has not improved. Due to her acute respiratory issue and her overall poor prognosis, family has decided on hospice home. Initial plan was to discharge patient to skilled nursing facility with hospice when she was medically stable. 2. Acute on chronic diastolic CHF Echo from March 2017 reviewed. Ejection fraction 60% Unable to exclude valve vegetation clinical correlation recommeded 3. Bibasilar pneumonia with COPD exacerbation and bronchiectasis She was treated with Levaquin and also initially vancomycin.  She was on IV steroids, nebulizers and inhalers.   4  acute renal failure on CKD stage III: Nephrotoxic agents were discontinued.  Etiology of acute renal failure was likely over diuresis.   5. History of rheumatoid arthritis-  6. Hypothyroidism- 7. Hyperkalemia: This was treated with Kayexalate, insulin and dextrose.   DISCHARGE CONDITIONS AND DIET:   Patient in guarded condition She will be discharged hospice home  CONSULTS OBTAINED:     DRUG ALLERGIES:   Allergies  Allergen Reactions  . Tramadol Nausea Only    DISCHARGE MEDICATIONS:   Current Discharge Medication List    START taking these medications   Details  acetaminophen (TYLENOL) 650 MG suppository Place 1 suppository (650 mg total) rectally every 4 (four) hours as needed for mild pain or fever. Qty: 12 suppository, Refills: 0    bisacodyl (DULCOLAX) 10 MG suppository Place 1 suppository (10 mg total) rectally daily as needed for moderate constipation. Qty: 6 suppository, Refills: 0    ipratropium-albuterol (DUONEB) 0.5-2.5 (3) MG/3ML SOLN Take 3 mLs by nebulization every 2 (two) hours as needed. Qty: 30 mL    LORazepam (ATIVAN) 0.5 MG tablet Take 1 tablet (0.5 mg total) by mouth every 4 (four) hours as needed for anxiety. Qty: 15 tablet, Refills: 0    metoprolol (LOPRESSOR) 50 MG tablet Take 1 tablet (50 mg total) by mouth 2 (two) times daily.    Morphine Sulfate (MORPHINE CONCENTRATE) 10 MG/0.5ML SOLN concentrated solution Take 0.25 mLs (5 mg total) by mouth every hour as needed for moderate pain, severe pain or shortness of breath. Qty: 30 mL, Refills: 0    prochlorperazine (COMPAZINE) 25 MG suppository Place 1 suppository (25 mg total) rectally  every 8 (eight) hours as needed for nausea or vomiting. Qty: 6 suppository, Refills: 0      STOP taking these medications     acetaminophen (TYLENOL) 500 MG tablet      Emollient (CETAPHIL MOISTURIZING EX)      ENSURE (ENSURE)      furosemide (LASIX) 20 MG tablet      guaifenesin (ROBITUSSIN) 100 MG/5ML  syrup      hydroxychloroquine (PLAQUENIL) 200 MG tablet      levothyroxine (SYNTHROID, LEVOTHROID) 50 MCG tablet      losartan (COZAAR) 50 MG tablet      magnesium sulfate (EPSOM SALT) GRAN      potassium chloride 20 MEQ/15ML (10%) SOLN      hydroxypropyl methylcellulose / hypromellose (ISOPTO TEARS / GONIOVISC) 2.5 % ophthalmic solution      Menthol, Topical Analgesic, (BENGAY VANISHING SCENT EX)      OXYGEN      PARoxetine (PAXIL) 10 MG tablet               Today   CHIEF COMPLAINT:  Patient with increasing respiratory distress this morning   VITAL SIGNS:  Blood pressure 103/44, pulse 78, temperature 98 F (36.7 C), temperature source Axillary, resp. rate 18, height 4\' 11"  (1.499 m), weight 62.324 kg (137 lb 6.4 oz), SpO2 90 %.   REVIEW OF SYSTEMS:  Review of Systems  Unable to perform ROS: critical illness     PHYSICAL EXAMINATION:  GENERAL:  82 y.o.-year-old patient lying in the bed with  acute distress. Hard of hearing NECK:  Supple, no jugular venous distention. No thyroid enlargement, no tenderness.  LUNGS: Increased use of accessory muscles with bilateral crackles/rales CARDIOVASCULAR: S1, S2 normal. No murmurs, rubs, or gallops.  ABDOMEN: Soft, non-tender, non-distended. Bowel sounds present. No organomegaly or mass.  EXTREMITIES: No pedal edema, cyanosis, or clubbing.  PSYCHIATRIC: The patient is lethargic but arousable and oriented x name and place  SKIN: No obvious rash, lesion, or ulcer.   DATA REVIEW:   CBC  Recent Labs Lab 07/15/15 0532  WBC 13.5*  HGB 9.2*  HCT 28.3*  PLT 261    Chemistries   Recent Labs Lab 07/13/15 1439  07/17/15 0500  NA 135  < > 135  K 4.9  < > 3.8  CL 99*  < > 95*  CO2 28  < > 35*  GLUCOSE 172*  < > 89  BUN 33*  < > 60*  CREATININE 1.51*  < > 1.38*  CALCIUM 8.4*  < > 8.0*  AST 19  --   --   ALT 16  --   --   ALKPHOS 144*  --   --   BILITOT 0.7  --   --   < > = values in this interval not  displayed.  Cardiac Enzymes  Recent Labs Lab 07/13/15 1439  TROPONINI 0.06*    Microbiology Results  @[email or web address] @  RADIOLOGY:  Dg Chest 1 View  07/16/2015  CLINICAL DATA:  Congestive heart failure EXAM: CHEST 1 VIEW COMPARISON:  07/15/2015 FINDINGS: Cardiomegaly again noted. There is central vascular congestion perihilar interstitial prominence and mild infrahilar hazy airspace disease highly suspicious for bilateral pulmonary edema with slight worsening from prior exam. Small bilateral pleural effusion with bilateral basilar atelectasis or infiltrate. IMPRESSION: There is central vascular congestion perihilar interstitial prominence and mild infrahilar hazy airspace disease highly suspicious for bilateral pulmonary edema with slight worsening from prior exam. Small bilateral pleural effusion with bilateral basilar  atelectasis or infiltrate. Electronically Signed   By: Natasha Mead M.D.   On: 07/16/2015 11:37      Guarded condition for discharge hospice home Patient should follow up with hospice home  CODE STATUS:     Code Status Orders        Start     Ordered   07/17/15 0954  Do not attempt resuscitation (DNR)   Continuous    Question Answer Comment  In the event of cardiac or respiratory ARREST Do not call a "code blue"   In the event of cardiac or respiratory ARREST Do not perform Intubation, CPR, defibrillation or ACLS   In the event of cardiac or respiratory ARREST Use medication by any route, position, wound care, and other measures to relive pain and suffering. May use oxygen, suction and manual treatment of airway obstruction as needed for comfort.   Comments DNR and DNI and No BIPAP and No HI FLOW O2.      07/17/15 0954    Code Status History    Date Active Date Inactive Code Status Order ID Comments User Context   07/13/2015  7:02 PM 07/17/2015  9:54 AM DNR [id number]  Houston Siren, MD Inpatient   06/29/2015  3:06 PM 07/02/2015  1:59 PM DNR [id number]  Auburn Bilberry,  MD Inpatient   06/07/2015  6:00 PM 06/09/2015  6:09 PM DNR [id number]  Altamese Dilling, MD Inpatient   03/04/2015  9:31 AM 03/06/2015  3:44 PM DNR [id number]  Ihor Austin, MD Inpatient   08/13/2014 10:12 AM 08/14/2014  5:12 PM DNR [id number]  Auburn Bilberry, MD Inpatient   08/10/2014  8:18 PM 08/13/2014 10:12 AM Full Code [id number]  Wyatt Haste, MD ED      TOTAL TIME TAKING CARE OF THIS PATIENT: 35 minutes.    Note: This dictation was prepared with Dragon dictation along with smaller phrase technology. Any transcriptional errors that result from this process are unintentional.  Carlia Bomkamp M.D on 07/17/2015 at 10:23 AM  Between 7am to 6pm - Pager - [phone] After 6pm go to [email or web address] - password [email or web address] [facility]  [facility]  Office  [phone]  CC: Primary care physician; Leanna Sato, MD

## 2015-07-17 NOTE — Care Management Important Message (Signed)
Important Message  Patient Details  Name: Karen Bryan MRN: [id number] Date of Birth: Dec 19, 1919   Medicare Important Message Given:  Yes    [name] A [name] 07/17/2015, 1:32 PM

## 2015-07-17 NOTE — Progress Notes (Signed)
Physical Therapy Treatment Patient Details Name: Karen Bryan MRN: [id number] DOB: 18-Jul-1919 Today's Date: 07/17/2015    History of Present Illness Pt is 82 y.o. F admitted to hospital for acute on chronic respiratory failure and acute systolic CHF. Pt has hx of HTN, RA, anxiety and hypothyroidism. Pt previous admission 1 week ago d/t CHF.     PT Comments    Pt showing significant decline since previous PT session. Pt lethargic throughout entire session, occasionally opening eyes with verbal and tactile cues. Pt able to perform bed mobility with 2+ total assist. Once sitting, pt opened eyes for short period of time before returning to sleep. Pt unable to follow simple commands for hand placement while sitting. Pt demonstrates poor sitting balance, requiring assist to upright trunk. Plan for discharge has changed; pt to be sent to hospice care. Will continue to monitor, and resume PT treatment if appropriate.   Follow Up Recommendations  No PT follow up     Equipment Recommendations       Recommendations for Other Services       Precautions / Restrictions Precautions Precautions: Fall Restrictions Weight Bearing Restrictions: No    Mobility  Bed Mobility Overal bed mobility: +2 for physical assistance;Needs Assistance Bed Mobility: Supine to Sit     Supine to sit: Total assist;+2 for physical assistance     General bed mobility comments: Pt required 2+ total assist for bed mobility. Pt provided heavy cues regarding hand placement during transfer, however pt did not respond. Upon sitting, pt opened eyes and appeared alert for short period to time before becoming lethargic again.   Transfers                 General transfer comment: Unable to perform transfer d/t pt being too lethargic.   Ambulation/Gait                 Stairs            Wheelchair Mobility    Modified Rankin (Stroke Patients Only)       Balance Overall balance assessment:  Needs assistance Sitting-balance support: Bilateral upper extremity supported Sitting balance-Leahy Scale: Poor Sitting balance - Comments: Pt unable to maintain sitting balance w/o asssit w/ keeping trunk upright. Pt demonstrated anterior and posterior lean while sitting. Pt instructed to reach forward towards therapist and was unable to maintain balance.  Postural control: Other (comment) (poor)     Standing balance comment: Not assessed d/t pt being lethargic.                    Cognition Arousal/Alertness: Lethargic   Overall Cognitive Status: Difficult to assess                      Exercises      General Comments        Pertinent Vitals/Pain      Home Living                      Prior Function            PT Goals (current goals can now be found in the care plan section) Acute Rehab PT Goals Patient Stated Goal: to go back to ALF PT Goal Formulation: With patient Time For Goal Achievement: 07/14/15 Potential to Achieve Goals: Poor Progress towards PT goals: Not progressing toward goals - comment (pt showing significant decline since prior visit. )    Frequency  Min 2X/week    PT Plan Discharge plan needs to be updated    Co-evaluation             End of Session Equipment Utilized During Treatment: Oxygen Activity Tolerance: Patient limited by lethargy Patient left: in bed;with bed alarm set;with family/visitor present     Time: 0840-0900 PT Time Calculation (min) (ACUTE ONLY): 20 min  Charges:                       G Codes:      Dorita Fray August 03, 2015, 9:55 AM M. Hettie Holstein, SPT

## 2015-07-17 NOTE — Progress Notes (Addendum)
New hospice home referral received from [facility] following a Palliative Medicine Consult with Dr. Orvan Falconer. Karen Bryan was scheduled to be opened to hospice services at [facility] on 4/12, but was admitted to [facility] on 4/9 for evaluation and treatment of acute on chronic diastolic heart failure and hyperkalemia. She has been treated with IV lasix as well as IV antibiotics with minimal improvement. This morning she had an acute episode of dyspnea and hypoxia. Dr. Orvan Falconer has spoken to patient's son Karen Bryan and he has chosen to focus on her comfort and is in agreement with the plan for transfer today to the Hospice home for symptom management and end of life care.  Writer spoke with [place] [facility] via phone [phone]) to initiate education regarding hospice services, philosophy and team approach to care with good understanding voiced. Karen Bryan will be going to the hospice home to sign consents. Patient information faxed to referral. Hospital care team all aware of and in agreement with plan for discharge via EMS to the hospice home today. Signed portable DNR in place in discharge packet. Report called to the Hospice home, EMS notified for transport. Thank you for the opportunity to be involved in the care of this patient and her family. Dayna Barker RN, BSN, [facility] [place], hospital liaison [phone] c

## 2015-07-17 NOTE — Progress Notes (Signed)
Palliative Medicine Inpatient Consult Follow Up Note   Name: Karen Bryan Date: 07/17/2015 MRN: [id number]  DOB: 1919/10/04  Referring Physician: Adrian Saran, MD  Palliative Care consult requested for this 82 y.o. female for goals of medical therapy in patient with acute on chronic   TODAY'S DISCUSSIONS AND DECISIONS: Pt had a rough morning. She was becoming dyspneic and hypoxic and she was given 40 mg of lasix. This did not help much. She is now quite lethargic (though starting to partially arouse).  She was placed on 4 LPM O2 and sats were in mid 80's. Now, on 5 LPM, sats are 90%. Other VS are stable.   This turn for the worse is not unexpected. I had thought pt would do this but family and I thought she might go for a few weeks without decompensating. But, family says that they feel 'this time is very different'. She usually bounces back very quickly and completely and they know she is not doing so this time.   As discussed yesterday at length and also again this am by phone, family desires Hospice Home referral SINCE SHE HAS CHANGED and is now not appropriate for SNF.  She is very appropriate for Hospice Home, given this change in condition that has taken place so quickly after she started to stabilize.  I have updated Care Mgr, Attending, and Hospice Liaison, and will update Child psychotherapist. Nursing is taking out her dentures as pts breaths are pushing out her teeth.    I am going to change meds to comfort only and will do the DC med recs.  DNR form is in the chart.  IMPRESSION: Frequently recurring acute on chronic respiratory failure ---due to CHF and COPD (never smoker) and bibasilar pneumonia ---with hypoxemia ---followed as outpt by Dr Dema Severin, pulmonologist ---Dr Sung Amabile impression is Chronic Bronchiectasis with superimposed pulm edema as well as bronchospasm. He does not feel there is much underlying chronic interstitial lung disease.  Bronchiectasis Acute on Chronic Diastolic  CHF ---Followed by Dr Mariah Milling Suspected pulmonary fibrosis / interstitial lung disease ? --- bilateral interstitial thickening on CXR noted to be a chronic problem.  One of two blood cxs positive for staph aureus in during 3/4 admission Echo showing mitral valve chordae (but could not rule out vegetation) --see below ---was also seenon echo done 03/04/2015 Rheumatoid arthritis ---on Plaquenil Anxiety Hypthyroidsim HTN H/O prior pneumonias Gait disorder Weakness H/O bilateral pleural effusions Very hard of hearing ---have to write to her CAD ---evident on CT scans Dependent edema due to venous insufficiency Pulmonary HTN CKD stage III Chronic low back pain (with h/o vertebroplasty and epidural injections for pain Depression Mild Malnutrition--present at admission Hyperglycemia  Anemia of unclear etiology    REVIEW OF SYSTEMS:  Patient is not able to provide ROS due to illness  CODE STATUS: DNR   PAST MEDICAL HISTORY: Past Medical History  Diagnosis Date  . COPD (chronic obstructive pulmonary disease) (HCC)   . CHF (congestive heart failure) (HCC)   . Hypertension   . Bilateral pneumonia   . Chronic respiratory failure (HCC)   . Bilateral pleural effusion   . Rheumatoid arthritis (HCC)   . Anxiety   . Hypothyroidism     PAST SURGICAL HISTORY:  Past Surgical History  Procedure Laterality Date  . Hip surgery      Vital Signs: BP 103/44 mmHg  Pulse 78  Temp(Src) 98 F (36.7 C) (Axillary)  Resp 18  Ht 4\' 11"  (1.499 m)  Wt 62.324  kg (137 lb 6.4 oz)  BMI 27.74 kg/m2  SpO2 90%  LMP  (LMP Unknown) Filed Weights   07/13/15 1425 07/13/15 1902  Weight: 65.409 kg (144 lb 3.2 oz) 62.324 kg (137 lb 6.4 oz)    Estimated body mass index is 27.74 kg/(m^2) as calculated from the following:   Height as of this encounter: 4\' 11"  (1.499 m).   Weight as of this encounter: 62.324 kg (137 lb 6.4 oz).  PHYSICAL EXAM: Unresponsive---leaning to right side and not  waking up Sats in high 80's on 4 LPM [place] Eyes closed JVD noted  Hrt rrr with irreg beats Lungs w/ decreased BS throughout Abd soft and NT Ext edema noted  LABS: CBC:    Component Value Date/Time   WBC 13.5* 07/15/2015 0532   WBC 4.5 03/27/2014 0504   HGB 9.2* 07/15/2015 0532   HGB 10.1* 03/27/2014 0504   HCT 28.3* 07/15/2015 0532   HCT 31.4* 03/27/2014 0504   PLT 261 07/15/2015 0532   PLT 153 03/27/2014 0504   MCV 96.1 07/15/2015 0532   MCV 98 03/27/2014 0504   NEUTROABS 12.9* 07/15/2015 0532   NEUTROABS 3.0 03/27/2014 0504   LYMPHSABS 0.1* 07/15/2015 0532   LYMPHSABS 0.6* 03/27/2014 0504   MONOABS 0.4 07/15/2015 0532   MONOABS 0.6 03/27/2014 0504   EOSABS 0.0 07/15/2015 0532   EOSABS 0.3 03/27/2014 0504   BASOSABS 0.0 07/15/2015 0532   BASOSABS 0.0 03/27/2014 0504   BASOSABS 0 03/22/2014 1117   Comprehensive Metabolic Panel:    Component Value Date/Time   NA 135 07/17/2015 0500   NA 138 03/28/2014 0450   K 3.8 07/17/2015 0500   K 3.7 03/28/2014 0450   CL 95* 07/17/2015 0500   CL 97* 03/28/2014 0450   CO2 35* 07/17/2015 0500   CO2 37* 03/28/2014 0450   BUN 60* 07/17/2015 0500   BUN 22* 03/28/2014 0450   CREATININE 1.38* 07/17/2015 0500   CREATININE 1.14 03/28/2014 0450   GLUCOSE 89 07/17/2015 0500   GLUCOSE 103* 03/28/2014 0450   CALCIUM 8.0* 07/17/2015 0500   CALCIUM 8.4* 03/28/2014 0450   AST 19 07/13/2015 1439   AST 20 03/20/2014 1610   ALT 16 07/13/2015 1439   ALT 18 03/20/2014 1610   ALKPHOS 144* 07/13/2015 1439   ALKPHOS 77 03/20/2014 1610   BILITOT 0.7 07/13/2015 1439   BILITOT 0.3 03/20/2014 1610   PROT 7.5 07/13/2015 1439   PROT 7.3 03/20/2014 1610   ALBUMIN 3.4* 07/13/2015 1439   ALBUMIN 3.2* 03/20/2014 1610     More than 50% of the visit was spent in counseling/coordination of care: YES  Time Spent:  65 min

## 2015-07-17 NOTE — Progress Notes (Signed)
Notified Dr Juliene Pina that pt is drowsy, unable to follow commands. Pt responds to voice and light touch, able to verbalized her name only. O2 sats 89% on 4L O2 per . Increased O2 to 5L with O2 sats 90%. Pt is too drowsy to take PO meds. MD acknowledged, no new orders.

## 2015-07-21 LAB — CULTURE, BLOOD (ROUTINE X 2): Culture: NO GROWTH

## 2015-08-04 DEATH — deceased

## 2016-06-17 ENCOUNTER — Telehealth: Payer: Self-pay | Admitting: Cardiovascular Disease

## 2016-06-17 NOTE — Telephone Encounter (Signed)
3 attempts to schedule fu from recall list.  Deleting recall 12 month fu per ckout 01/03/15

## 2017-08-19 IMAGING — CR DG CHEST 2V
1 series · 2 of 2 positions shown · non-contrast
Comparison: 03/04/2015, 12/30/2014

CLINICAL DATA: Fever and congestion for 1 week, history of chronic
lung disease

EXAM:
CHEST  2 VIEW

[Series 1: dg chest 2 view · 0.14mm/px · 2 of 2 slices shown]
[im 1/2]
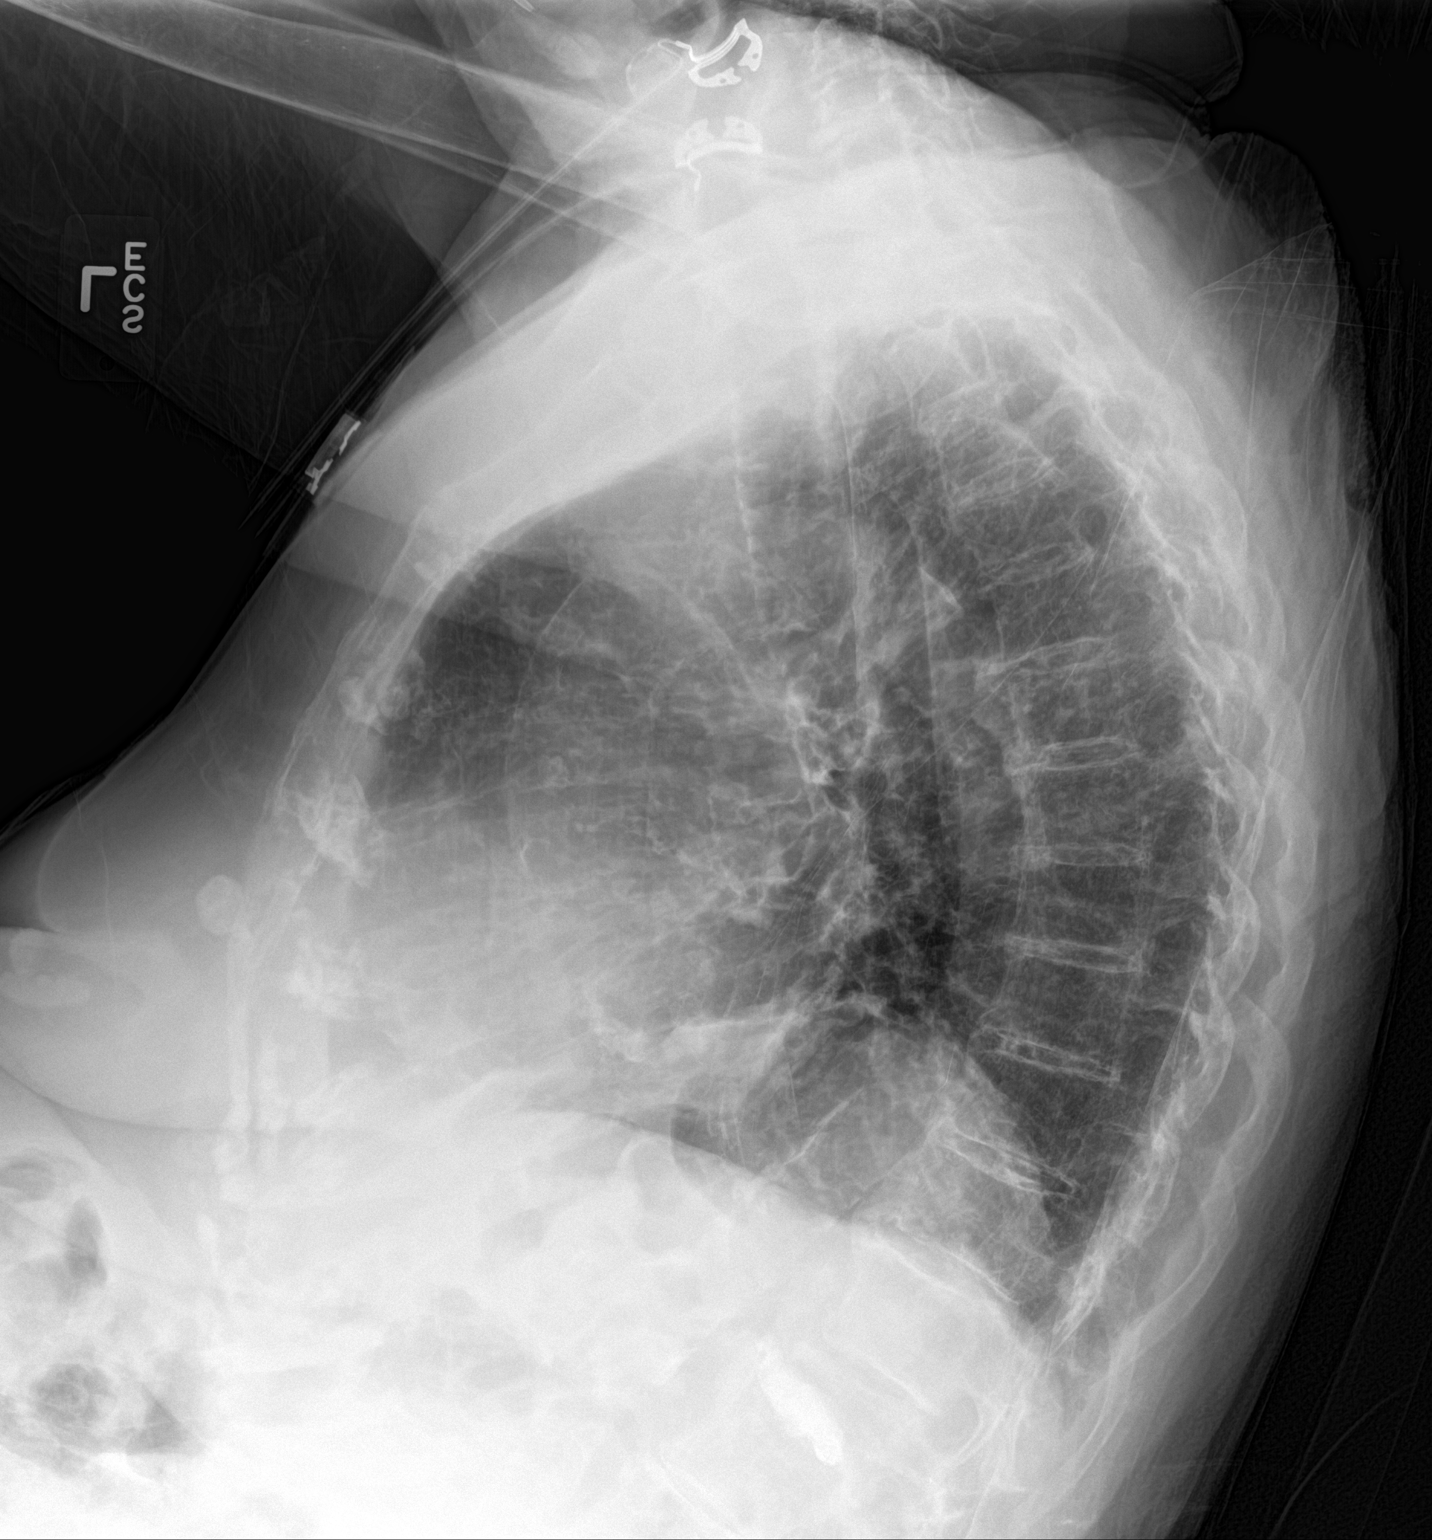
[im 2/2]
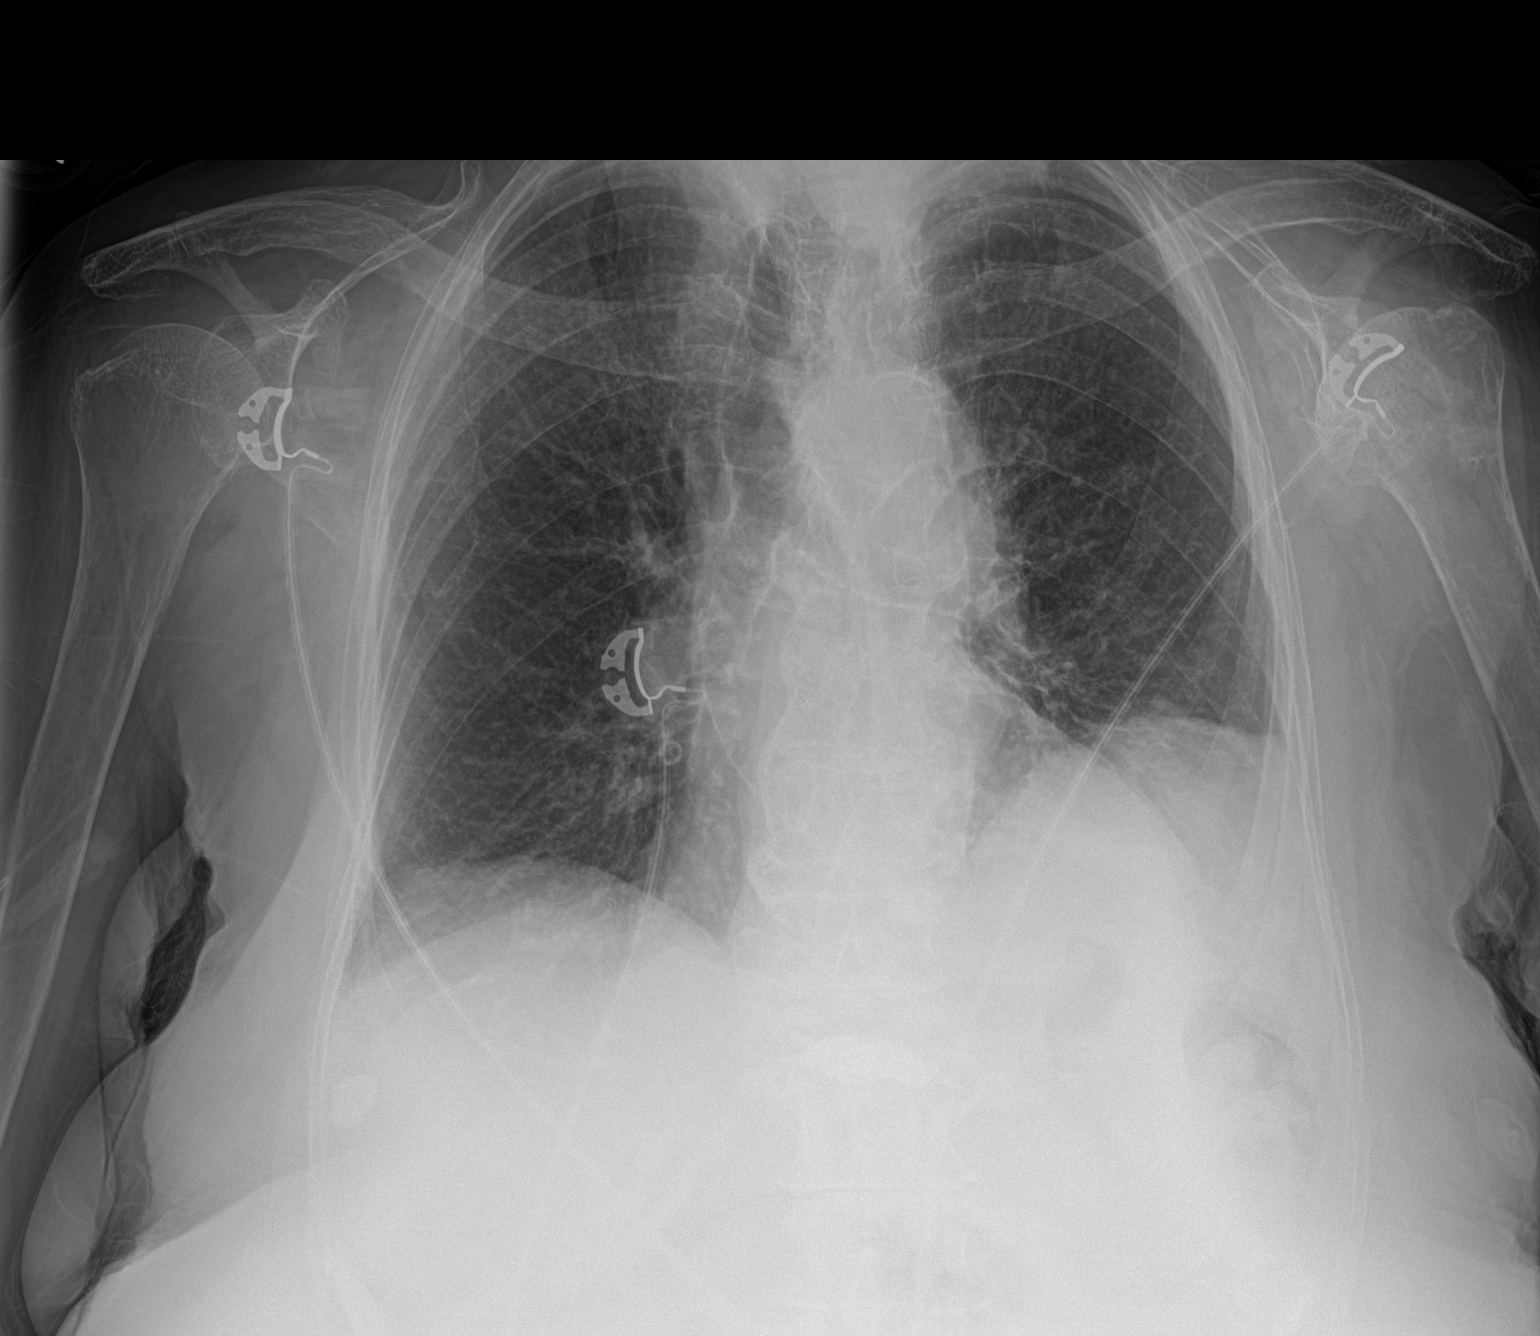

[2 of 2 positions shown; findings below may reference images not displayed]

FINDINGS: Heart size upper normal and stable. Stable mild elevation of the
left diaphragm. Mild diffuse interstitial prominence similar to
12/30/2014.
IMPRESSION: Mild interstitial lung disease, chronic.

## 2017-09-10 IMAGING — CT CT HEAD W/O CM
2 series · 15 of 30 positions shown, 19 images · non-contrast
Comparison: 03/04/2015; 09/29/2013

CLINICAL DATA: Hallucinations

EXAM:
CT HEAD WITHOUT CONTRAST
TECHNIQUE: Contiguous axial images were obtained from the base of the skull
through the vertex without intravenous contrast.

[Series 2: soft tissue · axial · 0.41mm/px · z∈[+185,+305]mm · 13 of 29 slices shown, 17 images]
[im 3/29  brain]
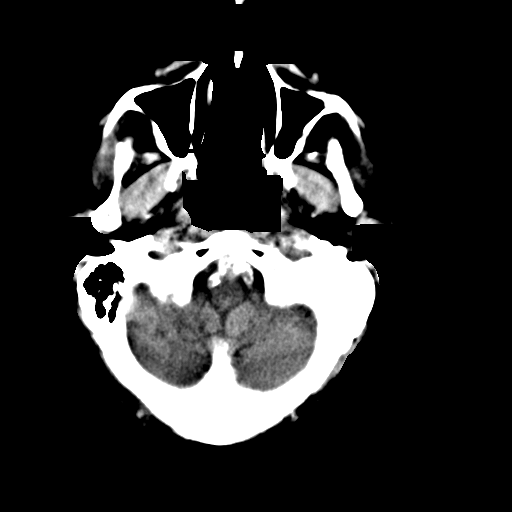
[im 3/29  bone]
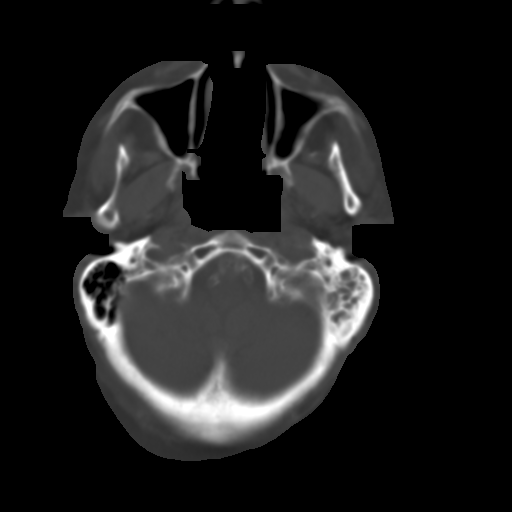
[im 5/29  brain]
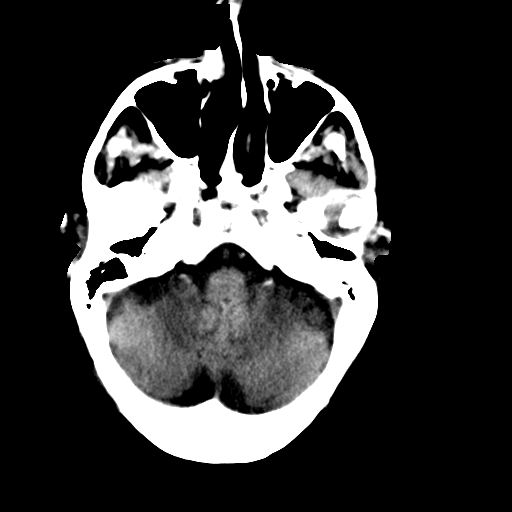
[im 7/29  brain]
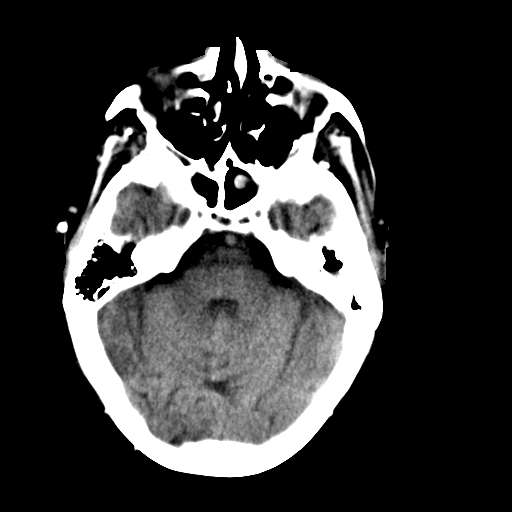
[im 9/29  brain]
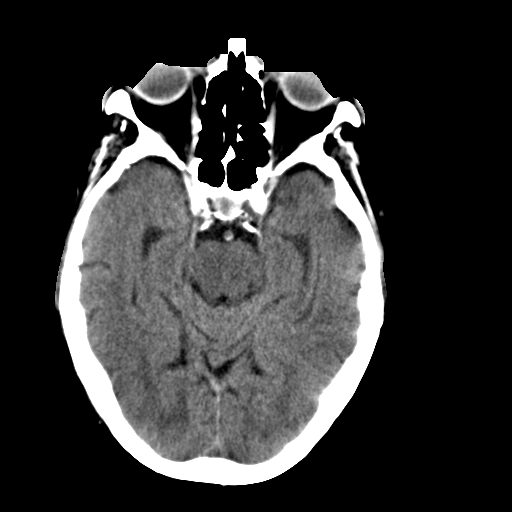
[im 11/29  brain]
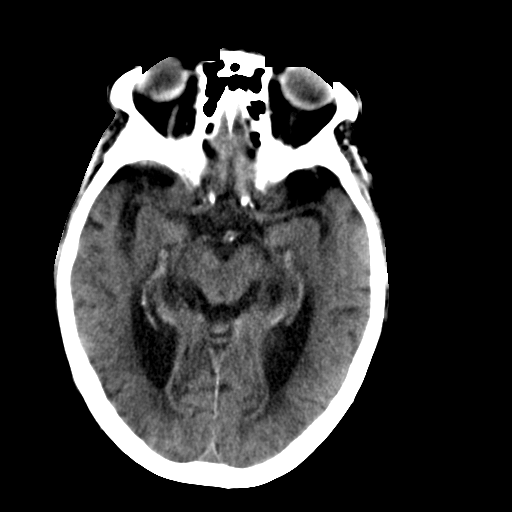
[im 11/29  bone]
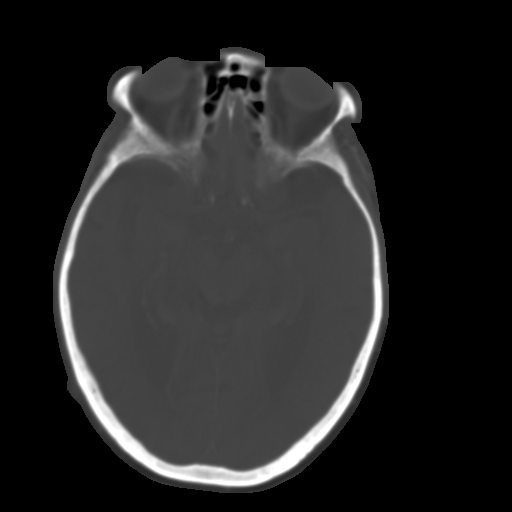
[im 13/29  brain]
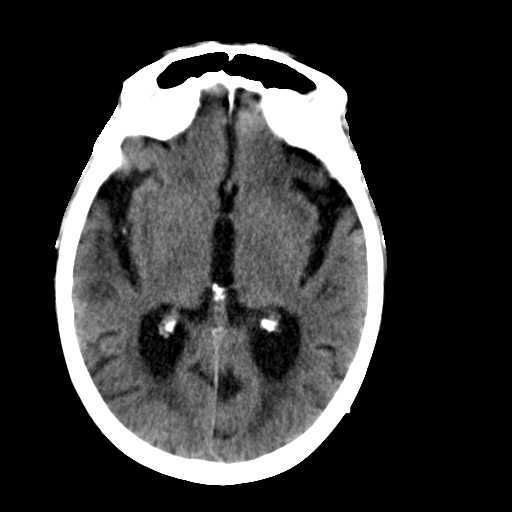
[im 15/29  brain]
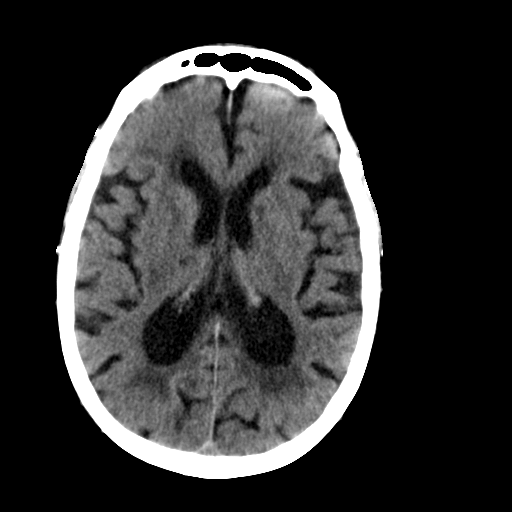
[im 17/29  brain]
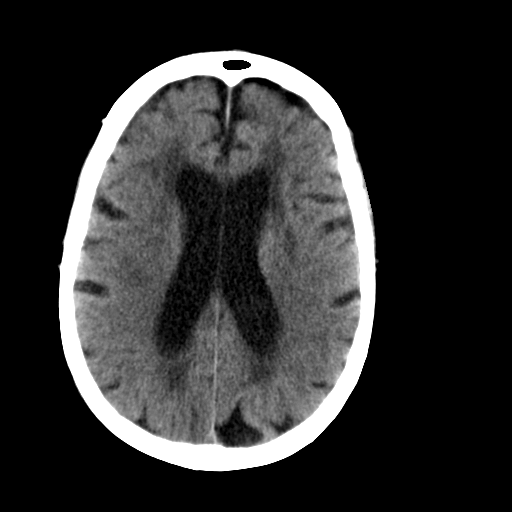
[im 19/29  brain]
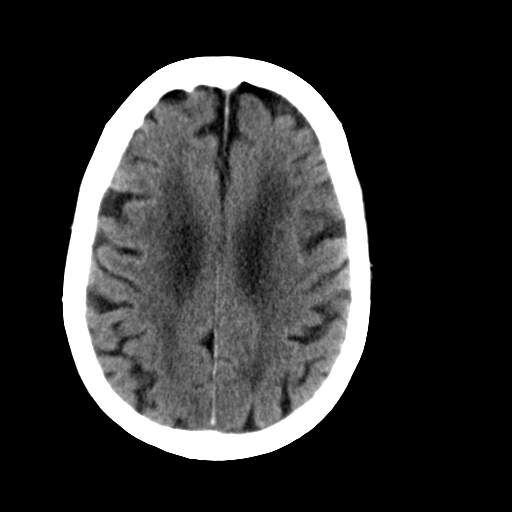
[im 19/29  bone]
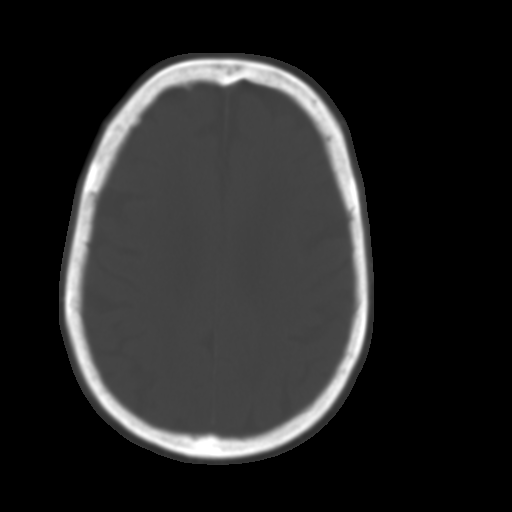
[im 21/29  brain]
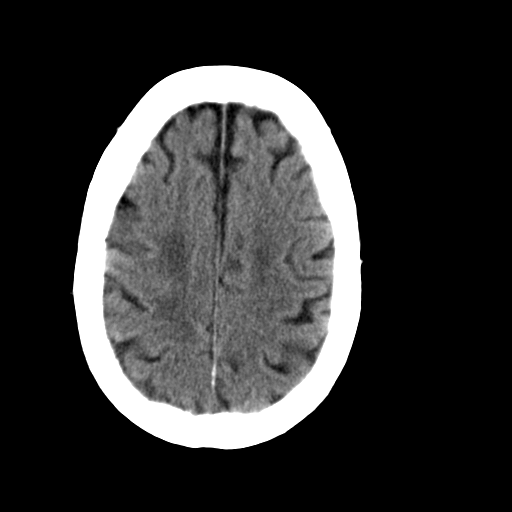
[im 23/29  brain]
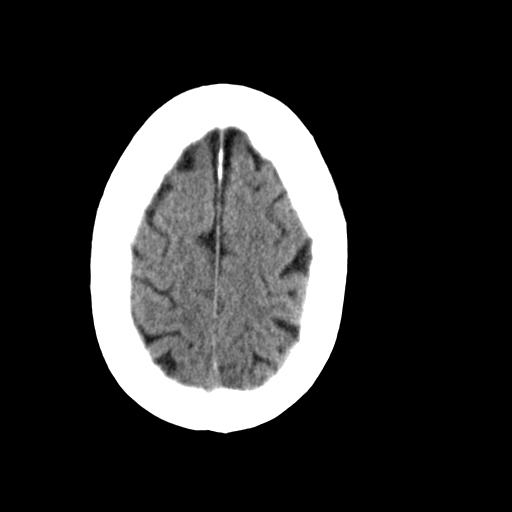
[im 25/29  brain]
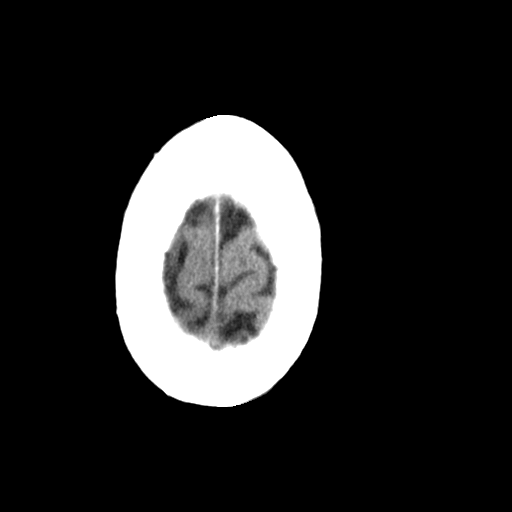
[im 27/29  brain]
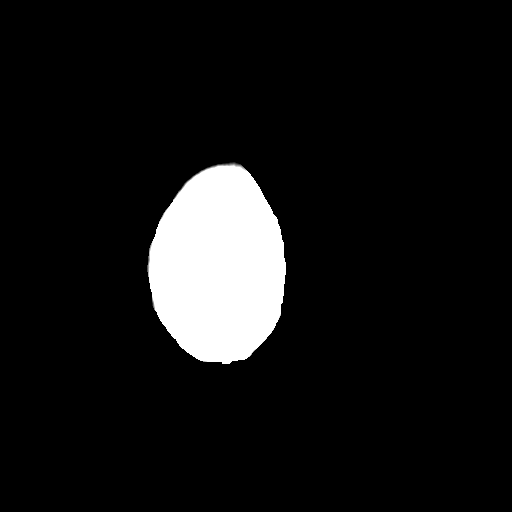
[im 27/29  bone]
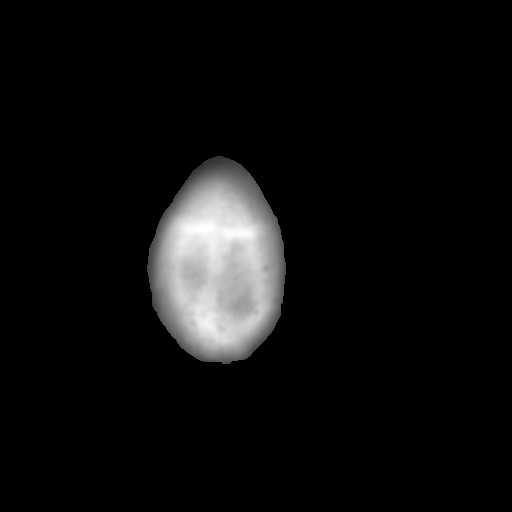

[Series 4: soft tissue recon · axial · 0.42mm/px · z∈[+230,+249]mm · 2 of 30 slices shown]
[im 3/30  brain]
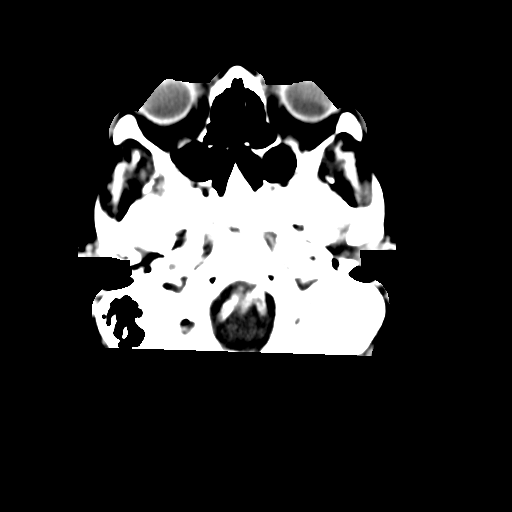
[im 7/30  brain]
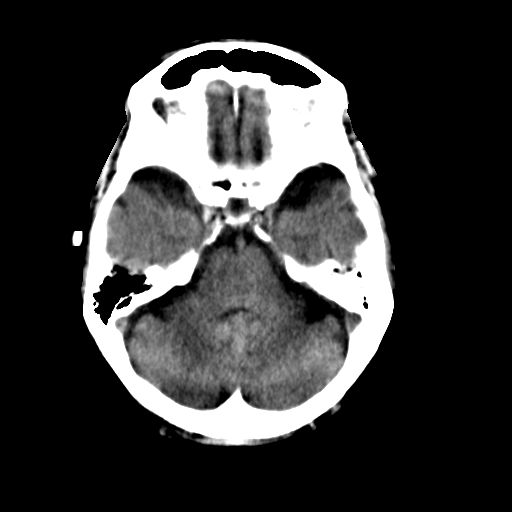

[15 of 30 positions shown; findings below may reference images not displayed]

FINDINGS: Re- demonstrated advanced atrophy with sulcal prominence and
centralized volume loss with commensurate ex vacuo dilatation of the
ventricular system. Extensive periventricular hypodensities
compatible microvascular ischemic disease. Old punctate right-sided
lacunar infarct (image 17, series 2). Given extensive background
parenchymal abnormalities, there is no CT evidence of superimposed
acute large territory infarct. No intraparenchymal or extra-axial
mass or hemorrhage. Unchanged size and configuration of the
ventricles and basilar cisterns. No midline shift. Intracranial
atherosclerosis.

Persistent opacification of the bilateral mastoid air cells.
Polypoid mucosal thickening of the left sphenoid sinus. The
remaining paranasal sinuses are normally aerated. No air-fluid
levels.

Regional soft tissues appear normal. Post bilateral cataract
surgery. No displaced calvarial fracture.
IMPRESSION: 1. Similar findings of advanced atrophy and microvascular ischemic
disease without acute intracranial process.
2. Bilateral mastoid air cell effusions, unchanged since the [DATE]
examination.
